# Patient Record
Sex: Male | Born: 1935 | Race: White | Hispanic: No | Marital: Married | Smoking: Former smoker
Health system: Southern US, Community
[De-identification: ages and names within clinical notes are randomized; demographics above are authoritative.]

## PROBLEM LIST (undated history)

## (undated) ENCOUNTER — Emergency Department (HOSPITAL_COMMUNITY): Admission: EM | Payer: Medicare Other | Source: Home / Self Care

## (undated) DIAGNOSIS — E785 Hyperlipidemia, unspecified: Secondary | ICD-10-CM

## (undated) DIAGNOSIS — I2510 Atherosclerotic heart disease of native coronary artery without angina pectoris: Secondary | ICD-10-CM

## (undated) DIAGNOSIS — N400 Enlarged prostate without lower urinary tract symptoms: Secondary | ICD-10-CM

## (undated) DIAGNOSIS — I10 Essential (primary) hypertension: Secondary | ICD-10-CM

## (undated) DIAGNOSIS — R519 Headache, unspecified: Secondary | ICD-10-CM

## (undated) DIAGNOSIS — R51 Headache: Secondary | ICD-10-CM

## (undated) HISTORY — DX: Hyperlipidemia, unspecified: E78.5

## (undated) HISTORY — PX: EXCISIONAL HEMORRHOIDECTOMY: SHX1541

## (undated) HISTORY — DX: Headache: R51

## (undated) HISTORY — PX: LUNG REMOVAL, PARTIAL: SHX233

## (undated) HISTORY — DX: Atherosclerotic heart disease of native coronary artery without angina pectoris: I25.10

## (undated) HISTORY — DX: Essential (primary) hypertension: I10

## (undated) HISTORY — DX: Headache, unspecified: R51.9

## (undated) HISTORY — DX: Benign prostatic hyperplasia without lower urinary tract symptoms: N40.0

---

## 1999-01-08 ENCOUNTER — Ambulatory Visit (HOSPITAL_COMMUNITY): Admission: RE | Admit: 1999-01-08 | Discharge: 1999-01-08 | Payer: Self-pay | Admitting: Internal Medicine

## 1999-01-10 ENCOUNTER — Encounter: Payer: Self-pay | Admitting: Internal Medicine

## 1999-01-10 ENCOUNTER — Ambulatory Visit (HOSPITAL_COMMUNITY): Admission: RE | Admit: 1999-01-10 | Discharge: 1999-01-10 | Payer: Self-pay | Admitting: Internal Medicine

## 1999-02-10 ENCOUNTER — Encounter: Payer: Self-pay | Admitting: Cardiothoracic Surgery

## 1999-02-11 ENCOUNTER — Inpatient Hospital Stay (HOSPITAL_COMMUNITY): Admission: RE | Admit: 1999-02-11 | Discharge: 1999-02-17 | Payer: Self-pay | Admitting: Cardiothoracic Surgery

## 1999-02-11 ENCOUNTER — Encounter: Payer: Self-pay | Admitting: Cardiothoracic Surgery

## 1999-02-12 ENCOUNTER — Encounter: Payer: Self-pay | Admitting: Cardiothoracic Surgery

## 1999-02-13 ENCOUNTER — Encounter: Payer: Self-pay | Admitting: Cardiothoracic Surgery

## 1999-02-14 ENCOUNTER — Encounter: Payer: Self-pay | Admitting: Cardiothoracic Surgery

## 1999-02-15 ENCOUNTER — Encounter: Payer: Self-pay | Admitting: Thoracic Surgery (Cardiothoracic Vascular Surgery)

## 1999-02-17 ENCOUNTER — Encounter: Payer: Self-pay | Admitting: Thoracic Surgery (Cardiothoracic Vascular Surgery)

## 2000-02-05 ENCOUNTER — Ambulatory Visit (HOSPITAL_COMMUNITY): Admission: RE | Admit: 2000-02-05 | Discharge: 2000-02-05 | Payer: Self-pay | Admitting: Internal Medicine

## 2000-02-05 ENCOUNTER — Encounter: Payer: Self-pay | Admitting: Internal Medicine

## 2001-01-29 ENCOUNTER — Emergency Department (HOSPITAL_COMMUNITY): Admission: EM | Admit: 2001-01-29 | Discharge: 2001-01-29 | Payer: Self-pay | Admitting: Emergency Medicine

## 2001-03-04 ENCOUNTER — Other Ambulatory Visit: Admission: RE | Admit: 2001-03-04 | Discharge: 2001-03-04 | Payer: Self-pay | Admitting: General Surgery

## 2003-05-08 ENCOUNTER — Ambulatory Visit (HOSPITAL_BASED_OUTPATIENT_CLINIC_OR_DEPARTMENT_OTHER): Admission: RE | Admit: 2003-05-08 | Discharge: 2003-05-08 | Payer: Self-pay | Admitting: Otolaryngology

## 2003-05-08 ENCOUNTER — Encounter (INDEPENDENT_AMBULATORY_CARE_PROVIDER_SITE_OTHER): Payer: Self-pay | Admitting: *Deleted

## 2004-07-07 ENCOUNTER — Ambulatory Visit: Payer: Self-pay | Admitting: Internal Medicine

## 2004-09-19 ENCOUNTER — Ambulatory Visit: Payer: Self-pay | Admitting: Internal Medicine

## 2004-09-25 ENCOUNTER — Ambulatory Visit: Payer: Self-pay | Admitting: Internal Medicine

## 2004-12-24 ENCOUNTER — Ambulatory Visit: Payer: Self-pay | Admitting: Internal Medicine

## 2004-12-26 ENCOUNTER — Ambulatory Visit: Payer: Self-pay | Admitting: Internal Medicine

## 2005-04-15 ENCOUNTER — Ambulatory Visit: Payer: Self-pay | Admitting: Internal Medicine

## 2005-04-22 ENCOUNTER — Ambulatory Visit: Payer: Self-pay | Admitting: Internal Medicine

## 2005-07-21 ENCOUNTER — Ambulatory Visit: Payer: Self-pay | Admitting: Internal Medicine

## 2005-07-24 ENCOUNTER — Ambulatory Visit: Payer: Self-pay | Admitting: Internal Medicine

## 2005-08-14 ENCOUNTER — Ambulatory Visit: Payer: Self-pay | Admitting: Internal Medicine

## 2005-09-24 ENCOUNTER — Ambulatory Visit: Payer: Self-pay | Admitting: Internal Medicine

## 2005-09-30 ENCOUNTER — Ambulatory Visit: Payer: Self-pay | Admitting: Internal Medicine

## 2005-10-28 ENCOUNTER — Ambulatory Visit: Payer: Self-pay | Admitting: Internal Medicine

## 2006-04-22 ENCOUNTER — Ambulatory Visit: Payer: Self-pay | Admitting: Internal Medicine

## 2006-04-30 ENCOUNTER — Ambulatory Visit: Payer: Self-pay | Admitting: Internal Medicine

## 2006-06-08 ENCOUNTER — Ambulatory Visit: Payer: Self-pay | Admitting: Internal Medicine

## 2007-03-14 ENCOUNTER — Ambulatory Visit: Payer: Self-pay | Admitting: Internal Medicine

## 2007-03-14 LAB — CONVERTED CEMR LAB
ALT: 19 units/L (ref 0–53)
AST: 17 units/L (ref 0–37)
BUN: 11 mg/dL (ref 6–23)
Bilirubin Urine: NEGATIVE
Creatinine, Ser: 1 mg/dL (ref 0.4–1.5)
Direct LDL: 126.9 mg/dL
Eosinophils Relative: 2.2 % (ref 0.0–5.0)
Glucose, Bld: 93 mg/dL (ref 70–99)
HDL: 30.6 mg/dL — ABNORMAL LOW (ref >39.0)
Hemoglobin, Urine: NEGATIVE
Leukocytes, UA: NEGATIVE
Monocytes Absolute: 0.4 10*3/uL (ref 0.2–0.7)
Neutrophils Relative %: 56.1 % (ref 43.0–77.0)
Platelets: 160 10*3/uL (ref 150–400)
Potassium: 3.9 meq/L (ref 3.5–5.1)
RBC: 4.77 M/uL (ref 4.22–5.81)
RDW: 12.9 % (ref 11.5–14.6)
Total Bilirubin: 1.3 mg/dL — ABNORMAL HIGH (ref 0.3–1.2)
Total Protein, Urine: NEGATIVE mg/dL
Triglycerides: 188 mg/dL — ABNORMAL HIGH (ref 0–149)
VLDL: 38 mg/dL (ref 0–40)
WBC: 5.3 10*3/uL (ref 4.5–10.5)
pH: 6 (ref 5.0–8.0)

## 2007-03-18 DIAGNOSIS — E785 Hyperlipidemia, unspecified: Secondary | ICD-10-CM

## 2007-03-18 DIAGNOSIS — N400 Enlarged prostate without lower urinary tract symptoms: Secondary | ICD-10-CM | POA: Insufficient documentation

## 2007-03-24 ENCOUNTER — Ambulatory Visit: Payer: Self-pay | Admitting: Internal Medicine

## 2007-04-08 ENCOUNTER — Encounter (INDEPENDENT_AMBULATORY_CARE_PROVIDER_SITE_OTHER): Payer: Self-pay | Admitting: General Surgery

## 2007-04-08 ENCOUNTER — Ambulatory Visit (HOSPITAL_COMMUNITY): Admission: RE | Admit: 2007-04-08 | Discharge: 2007-04-08 | Payer: Self-pay | Admitting: General Surgery

## 2007-12-23 ENCOUNTER — Telehealth: Payer: Self-pay | Admitting: Internal Medicine

## 2007-12-23 DIAGNOSIS — Z860100 Personal history of colon polyps, unspecified: Secondary | ICD-10-CM | POA: Insufficient documentation

## 2008-01-11 ENCOUNTER — Ambulatory Visit: Payer: Self-pay | Admitting: Gastroenterology

## 2008-01-25 ENCOUNTER — Ambulatory Visit: Payer: Self-pay | Admitting: Gastroenterology

## 2008-01-25 LAB — HM COLONOSCOPY

## 2008-03-19 ENCOUNTER — Ambulatory Visit: Payer: Self-pay | Admitting: Internal Medicine

## 2008-03-19 LAB — CONVERTED CEMR LAB
AST: 18 units/L (ref 0–37)
BUN: 10 mg/dL (ref 6–23)
Basophils Absolute: 0 10*3/uL (ref 0.0–0.1)
Basophils Relative: 0.9 % (ref 0.0–3.0)
Bilirubin, Direct: 0.1 mg/dL (ref 0.0–0.3)
Calcium: 9.2 mg/dL (ref 8.4–10.5)
Cholesterol: 208 mg/dL (ref 0–200)
Creatinine, Ser: 1.1 mg/dL (ref 0.4–1.5)
GFR calc Af Amer: 85 mL/min
GFR calc non Af Amer: 70 mL/min
Glucose, Bld: 89 mg/dL (ref 70–99)
HDL: 29.5 mg/dL — ABNORMAL LOW (ref >39.0)
Hemoglobin, Urine: NEGATIVE
Ketones, ur: NEGATIVE mg/dL
MCHC: 34.6 g/dL (ref 30.0–36.0)
MCV: 93.1 fL (ref 78.0–100.0)
Monocytes Relative: 8.2 % (ref 3.0–12.0)
Neutro Abs: 2.7 10*3/uL (ref 1.4–7.7)
Neutrophils Relative %: 53.8 % (ref 43.0–77.0)
PSA: 3.57 ng/mL (ref 0.10–4.00)
Platelets: 156 10*3/uL (ref 150–400)
RDW: 12.5 % (ref 11.5–14.6)
Specific Gravity, Urine: 1.02 (ref 1.000–1.03)
TSH: 3.24 microintl units/mL (ref 0.35–5.50)
Total CHOL/HDL Ratio: 7.1
Total Protein, Urine: NEGATIVE mg/dL
Triglycerides: 135 mg/dL (ref 0–149)
WBC: 4.9 10*3/uL (ref 4.5–10.5)

## 2008-03-26 ENCOUNTER — Ambulatory Visit: Payer: Self-pay | Admitting: Internal Medicine

## 2008-03-26 DIAGNOSIS — I10 Essential (primary) hypertension: Secondary | ICD-10-CM | POA: Insufficient documentation

## 2008-03-26 DIAGNOSIS — J069 Acute upper respiratory infection, unspecified: Secondary | ICD-10-CM | POA: Insufficient documentation

## 2009-01-28 ENCOUNTER — Telehealth: Payer: Self-pay | Admitting: Internal Medicine

## 2009-01-30 ENCOUNTER — Ambulatory Visit: Payer: Self-pay | Admitting: Internal Medicine

## 2009-01-30 DIAGNOSIS — R519 Headache, unspecified: Secondary | ICD-10-CM | POA: Insufficient documentation

## 2009-01-30 DIAGNOSIS — J310 Chronic rhinitis: Secondary | ICD-10-CM | POA: Insufficient documentation

## 2009-01-30 DIAGNOSIS — J329 Chronic sinusitis, unspecified: Secondary | ICD-10-CM | POA: Insufficient documentation

## 2009-01-30 DIAGNOSIS — R51 Headache: Secondary | ICD-10-CM

## 2009-01-30 LAB — CONVERTED CEMR LAB
Calcium: 9.3 mg/dL (ref 8.4–10.5)
Creatinine, Ser: 1.2 mg/dL (ref 0.4–1.5)

## 2009-01-31 ENCOUNTER — Telehealth: Payer: Self-pay | Admitting: Internal Medicine

## 2009-02-25 ENCOUNTER — Telehealth: Payer: Self-pay | Admitting: Internal Medicine

## 2009-02-27 ENCOUNTER — Encounter: Payer: Self-pay | Admitting: Internal Medicine

## 2009-03-11 ENCOUNTER — Telehealth (INDEPENDENT_AMBULATORY_CARE_PROVIDER_SITE_OTHER): Payer: Self-pay | Admitting: *Deleted

## 2009-03-12 ENCOUNTER — Encounter (INDEPENDENT_AMBULATORY_CARE_PROVIDER_SITE_OTHER): Payer: Self-pay | Admitting: *Deleted

## 2009-04-23 ENCOUNTER — Encounter: Payer: Self-pay | Admitting: Internal Medicine

## 2009-05-10 ENCOUNTER — Encounter: Payer: Self-pay | Admitting: Internal Medicine

## 2009-06-07 ENCOUNTER — Ambulatory Visit: Payer: Self-pay | Admitting: Internal Medicine

## 2009-06-07 DIAGNOSIS — Z87891 Personal history of nicotine dependence: Secondary | ICD-10-CM | POA: Insufficient documentation

## 2009-06-10 LAB — CONVERTED CEMR LAB
ALT: 22 units/L (ref 0–53)
AST: 20 units/L (ref 0–37)
Albumin: 3.9 g/dL (ref 3.5–5.2)
Bilirubin Urine: NEGATIVE
CO2: 30 meq/L (ref 19–32)
Calcium: 9.2 mg/dL (ref 8.4–10.5)
Chloride: 107 meq/L (ref 96–112)
Creatinine, Ser: 1.1 mg/dL (ref 0.4–1.5)
Eosinophils Relative: 2.4 % (ref 0.0–5.0)
Hemoglobin, Urine: NEGATIVE
Hemoglobin: 15.1 g/dL (ref 13.0–17.0)
Leukocytes, UA: NEGATIVE
Lymphocytes Relative: 26.5 % (ref 12.0–46.0)
Lymphs Abs: 1.2 10*3/uL (ref 0.7–4.0)
MCV: 93.3 fL (ref 78.0–100.0)
Monocytes Absolute: 0.3 10*3/uL (ref 0.1–1.0)
Monocytes Relative: 6.4 % (ref 3.0–12.0)
Neutro Abs: 3.1 10*3/uL (ref 1.4–7.7)
Neutrophils Relative %: 63.7 % (ref 43.0–77.0)
Nitrite: NEGATIVE
Platelets: 164 10*3/uL (ref 150.0–400.0)
Potassium: 4.1 meq/L (ref 3.5–5.1)
RDW: 12.6 % (ref 11.5–14.6)
Specific Gravity, Urine: 1.015 (ref 1.000–1.030)
Total Bilirubin: 0.9 mg/dL (ref 0.3–1.2)
Total Protein, Urine: NEGATIVE mg/dL
Total Protein: 7.3 g/dL (ref 6.0–8.3)

## 2009-06-14 ENCOUNTER — Encounter: Payer: Self-pay | Admitting: Internal Medicine

## 2009-06-14 DIAGNOSIS — R9431 Abnormal electrocardiogram [ECG] [EKG]: Secondary | ICD-10-CM | POA: Insufficient documentation

## 2009-06-17 ENCOUNTER — Encounter: Payer: Self-pay | Admitting: Internal Medicine

## 2009-06-19 ENCOUNTER — Encounter (INDEPENDENT_AMBULATORY_CARE_PROVIDER_SITE_OTHER): Payer: Self-pay | Admitting: *Deleted

## 2009-07-02 ENCOUNTER — Ambulatory Visit: Payer: Self-pay | Admitting: Cardiovascular Disease

## 2009-07-22 ENCOUNTER — Ambulatory Visit: Payer: Self-pay | Admitting: Cardiology

## 2009-07-22 ENCOUNTER — Ambulatory Visit (HOSPITAL_COMMUNITY): Admission: RE | Admit: 2009-07-22 | Discharge: 2009-07-22 | Payer: Self-pay | Admitting: Cardiovascular Disease

## 2009-07-22 ENCOUNTER — Ambulatory Visit: Payer: Self-pay

## 2009-07-22 ENCOUNTER — Encounter: Payer: Self-pay | Admitting: Cardiovascular Disease

## 2009-08-07 ENCOUNTER — Telehealth: Payer: Self-pay | Admitting: Internal Medicine

## 2009-12-17 ENCOUNTER — Encounter: Payer: Self-pay | Admitting: Internal Medicine

## 2010-03-10 ENCOUNTER — Encounter: Payer: Self-pay | Admitting: Internal Medicine

## 2010-03-10 ENCOUNTER — Encounter: Admission: RE | Admit: 2010-03-10 | Discharge: 2010-03-10 | Payer: Self-pay | Admitting: Neurology

## 2010-05-27 ENCOUNTER — Encounter: Payer: Self-pay | Admitting: Internal Medicine

## 2010-08-19 ENCOUNTER — Encounter: Payer: Self-pay | Admitting: Internal Medicine

## 2010-08-19 ENCOUNTER — Ambulatory Visit
Admission: RE | Admit: 2010-08-19 | Discharge: 2010-08-19 | Payer: Self-pay | Source: Home / Self Care | Attending: Internal Medicine | Admitting: Internal Medicine

## 2010-08-19 DIAGNOSIS — D485 Neoplasm of uncertain behavior of skin: Secondary | ICD-10-CM | POA: Insufficient documentation

## 2010-08-19 DIAGNOSIS — R0989 Other specified symptoms and signs involving the circulatory and respiratory systems: Secondary | ICD-10-CM | POA: Insufficient documentation

## 2010-08-22 LAB — CONVERTED CEMR LAB
Alkaline Phosphatase: 94 units/L (ref 39–117)
Bilirubin Urine: NEGATIVE
Bilirubin, Direct: 0.1 mg/dL (ref 0.0–0.3)
Blood, UA: NEGATIVE
Calcium: 9.2 mg/dL (ref 8.4–10.5)
Creatinine, Ser: 1 mg/dL (ref 0.4–1.5)
Eosinophils Absolute: 0 10*3/uL (ref 0.0–0.7)
Eosinophils Relative: 0.7 % (ref 0.0–5.0)
HCT: 41.7 % (ref 39.0–52.0)
HDL: 33.1 mg/dL — ABNORMAL LOW (ref >39.00)
Hemoglobin: 14.4 g/dL (ref 13.0–17.0)
Leukocytes, UA: NEGATIVE
Lymphocytes Relative: 19.7 % (ref 12.0–46.0)
Monocytes Absolute: 0.3 10*3/uL (ref 0.1–1.0)
Neutro Abs: 3.8 10*3/uL (ref 1.4–7.7)
Nitrite: NEGATIVE
PSA: 5.41 ng/mL — ABNORMAL HIGH (ref 0.10–4.00)
RDW: 13.8 % (ref 11.5–14.6)
TSH: 2.12 microintl units/mL (ref 0.35–5.50)
Total Bilirubin: 0.9 mg/dL (ref 0.3–1.2)
Total CHOL/HDL Ratio: 7
Total Protein, Urine: NEGATIVE mg/dL
Triglycerides: 179 mg/dL — ABNORMAL HIGH (ref 0.0–149.0)
Urobilinogen, UA: 0.2 (ref 0.0–1.0)
VLDL: 35.8 mg/dL (ref 0.0–40.0)
WBC: 5.2 10*3/uL (ref 4.5–10.5)

## 2010-09-08 ENCOUNTER — Encounter: Payer: Self-pay | Admitting: Internal Medicine

## 2010-09-16 NOTE — Letter (Signed)
Summary: [facility]  [facility]   Imported By: Lester  01/20/2010 09:44:37  _____________________________________________________________________  External Attachment:    Type:   Image     Comment:   External Document

## 2010-09-16 NOTE — Letter (Signed)
Summary: [facility]  [facility]   Imported By: Sherian Rein 03/20/2010 07:55:45  _____________________________________________________________________  External Attachment:    Type:   Image     Comment:   External Document

## 2010-09-16 NOTE — Miscellaneous (Signed)
Summary: Flu 2011   Clinical Lists Changes  Observations: Added new observation of FLU VAX: Historical (05/24/2010 11:55)      Immunization History:  Influenza Immunization History:    Influenza:  historical (05/24/2010)

## 2010-09-18 NOTE — Assessment & Plan Note (Signed)
Summary: YEARLY FU/ MEDICARE/ WILL COME FASTING/NWS  #   Vital Signs:  Patient profile:   77 year old male Height:      70 inches Weight:      202 pounds BMI:     29.09 Temp:     97.5 degrees F oral Pulse rate:   96 / minute Pulse rhythm:   regular Resp:     16 per minute BP sitting:   160 / 100  (left arm) Cuff size:   regular  Vitals Entered By: Lanier Prude, CMA(AAMA) (August 19, 2010 8:30 AM) CC: MWV Is Patient Diabetic? No   CC:  MWV.  History of Present Illness: The patient presents for a preventive health examination   Current Medications (verified): 1)  Terazosin Hcl 10 Mg  Caps (Terazosin Hcl) .... Once Daily 2)  Gensing .... Once Daily 3)  Vitamin C 500 Mg Tabs (Ascorbic Acid) .Marland Kitchen.. 1 Tab By Mouth Once Daily 4)  Glucosamine-Chondroitin   Caps (Glucosamine-Chondroit-Vit C-Mn) .Marland Kitchen.. 1 Tab By Mouth Once Daily 5)  Multivitamins   Tabs (Multiple Vitamin) .... Tab By Mouth Once Daily 6)  Omega-3 350 Mg Caps (Omega-3 Fatty Acids) .Marland Kitchen.. 1 Tab By Mouth Once Daily 7)  Saw Palmetto   Powd (Saw [place]) .... Daily 8)  Vitamin D3 1000 Unit  Tabs (Cholecalciferol) .Marland Kitchen.. 1 By Mouth Daily 9)  Fluticasone Propionate 50 Mcg/act  Susp (Fluticasone Propionate) .... 2 Sprays Each Nostril Once Daily 10)  Niacin Cr 500 Mg Cr-Caps (Niacin) .Marland Kitchen.. 1 Tab By Mouth Once Daily 11)  Zostavax 96045 Unt/0.63ml Solr (Zoster Vaccine Live) .... Administer As Directed By Pharmacy or At Dr's Office  Allergies (verified): 1)  * Lisinopril  Past History:  Past Medical History: Last updated: 06/07/2009 Benign prostatic hypertrophy Dr Isabel Caprice Hyperlipidemia Hypertension Traction HAs Dr Anne Hahn - had an MRI/MRA  Past Surgical History: Last updated: Jul 05, 2009  Excision of external hemorrhoids.   Family History: Last updated: 2009-07-05  Mother died with complications of rheumatic fever in   her 30s. Father died of MI at age 41 or 49. There is no cancer or   diabetes in the family.       Social History: Last updated: 07-05-09  He retired about a year ago. He does not smoke or drink   alcohol. He is married.      Review of Systems  The patient denies anorexia, fever, weight loss, weight gain, vision loss, decreased hearing, hoarseness, chest pain, syncope, dyspnea on exertion, peripheral edema, prolonged cough, headaches, hemoptysis, abdominal pain, melena, hematochezia, severe indigestion/heartburn, hematuria, incontinence, genital sores, muscle weakness, suspicious skin lesions, transient blindness, difficulty walking, depression, unusual weight change, abnormal bleeding, enlarged lymph nodes, angioedema, and testicular masses.    Physical Exam  General:  Well-developed,well-nourished,in no acute distress; alert,appropriate and cooperative throughout examination Head:  Normocephalic and atraumatic without obvious abnormalities. No apparent alopecia or balding. Eyes:  No corneal or conjunctival inflammation noted. EOMI. Perrla.  Ears:  Eryth throat Nose:  Swollen B nasal passages Mouth:  Oral mucosa and oropharynx without lesions or exudates.  Teeth in good repair. Neck:  No deformities, masses, or tenderness noted. Chest Wall:  No deformities, masses, tenderness or gynecomastia noted. Lungs:  Normal respiratory effort, chest expands symmetrically. Lungs are clear to auscultation, no crackles or wheezes. Heart:  Normal rate and regular rhythm. S1 and S2 normal without gallop, murmur, click, rub or other extra sounds. Abdomen:  Bowel sounds positive,abdomen soft and non-tender without masses, organomegaly or  hernias noted. Msk:  No deformity or scoliosis noted of thoracic or lumbar spine.   Pulses:  R and L carotid,radial,femoral,dorsalis pedis and posterior tibial pulses are full and equal bilaterally Extremities:  No clubbing, cyanosis, edema, or deformity noted with normal full range of motion of all joints.   Neurologic:  No cranial nerve deficits noted. Station  and gait are normal. Plantar reflexes are down-going bilaterally. DTRs are symmetrical throughout. Sensory, motor and coordinative functions appear intact. Skin:  L shoulder lesion 11 mm Cervical Nodes:  No lymphadenopathy noted Inguinal Nodes:  No significant adenopathy Psych:  Cognition and judgment appear intact. Alert and cooperative with normal attention span and concentration. No apparent delusions, illusions, hallucinations   Impression & Recommendations:  Problem # 1:  WELL ADULT EXAM (ICD-V70.0) Assessment New The labs were ordered Zostavax Orders: EKG w/ Interpretation (93000) TLB-BMP (Basic Metabolic Panel-BMET) (80048-METABOL) TLB-CBC Platelet - w/Differential (85025-CBCD) TLB-Hepatic/Liver Function Pnl (80076-HEPATIC) TLB-Lipid Panel (80061-LIPID) TLB-PSA (Prostate Specific Antigen) (84153-PSA) TLB-TSH (Thyroid Stimulating Hormone) (84443-TSH) TLB-Udip ONLY (81003-UDIP) no change from before Health and age related issues were discussed. Available screening tests and vaccinations were discussed as well. Healthy life style including good diet and exercise was discussed.   Problem # 2:  HYPERTENSION (ICD-401.9) Assessment: Deteriorated  His updated medication list for this problem includes:    Losartan Potassium 100 Mg Tabs (Losartan potassium) .Marland Kitchen... 1 by mouth once daily for blood pressure    Terazosin Hcl 10 Mg Caps (Terazosin hcl) ..... Once daily  BP today: 160/100 Prior BP: 150/80 (07/02/2009)  Labs Reviewed: K+: 4.1 (06/07/2009) Creat: : 1.1 (06/07/2009)   Chol: 208 (03/19/2008)   HDL: 29.5 (03/19/2008)   LDL: DEL (03/19/2008)   TG: 135 (03/19/2008)  Problem # 3:  DYSLIPIDEMIA (ICD-272.4) Assessment: Unchanged  His updated medication list for this problem includes:    Niacin Cr 500 Mg Cr-caps (Niacin) .Marland Kitchen... 1 tab by mouth once daily  Problem # 4:  BENIGN PROSTATIC HYPERTROPHY (ICD-600.00) Assessment: Unchanged  His updated medication list for this  problem includes:    Terazosin Hcl 10 Mg Caps (Terazosin hcl) ..... 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 1 by mouth once daily for blood pressure 2)  Terazosin Hcl 10 Mg Caps (Terazosin hcl) .... Once daily 3)  Fluticasone Propionate 50 Mcg/act Susp (Fluticasone propionate) .... 2 sprays each nostril once daily 4)  Niacin Cr 500 Mg Cr-caps (Niacin) .Marland Kitchen.. 1 tab by mouth once daily 5)  Zostavax 04540 Unt/0.64ml Solr (Zoster vaccine live) .... Administer as directed by pharmacy or at dr's office 6)  Gensing  .... Once daily 7)  Vitamin C 500 Mg Tabs (Ascorbic acid) .Marland Kitchen.. 1 tab by mouth once daily 8)  Glucosamine-chondroitin Caps (Glucosamine-chondroit-vit c-mn) .Marland Kitchen.. 1 tab by mouth once daily 9)  Multivitamins Tabs (Multiple vitamin) .... Tab by mouth once daily 10)  Omega-3 350 Mg Caps (Omega-3 fatty acids) .Marland Kitchen.. 1 tab by mouth once daily 11)  Saw Palmetto Powd (Saw palmetto) .... Daily 12)  Vitamin D3 1000 Unit Tabs (Cholecalciferol) .Marland Kitchen.. 1 by mouth daily  Other Orders: Zoster (Shingles) Vaccine Live [phone]) Admin 1st Vaccine (14782) T-2 View CXR, Same Day (71020.5TC)  Patient Instructions: 1)  Contour pillow 2)  Please schedule a follow-up appointment in 1-2 months skin bx  . Prescriptions: FLUTICASONE PROPIONATE 50 MCG/ACT  SUSP (FLUTICASONE PROPIONATE) 2 sprays each nostril once daily  #1 x 11   Entered and Authorized by:   Tresa Garter MD   Signed by:   Tresa Garter MD on 08/19/2010   Method used:   Print then Give to Patient   RxID:   [id number] TERAZOSIN HCL 10 MG  CAPS (TERAZOSIN HCL) once daily  #30 x 11   Entered and Authorized by:   Tresa Garter MD   Signed by:    Tresa Garter MD on 08/19/2010   Method used:   Print then Give to Patient   RxID:   [id number] QIONGEXB POTASSIUM 100 MG TABS (LOSARTAN POTASSIUM) 1 by mouth once daily for blood pressure  #30 x 11   Entered and Authorized by:   Tresa Garter MD   Signed by:   Tresa Garter MD on 08/19/2010   Method used:   Print then Give to Patient   RxID:   [id number]    Orders Added: 1)  EKG w/ Interpretation [93000] 2)  Est. Patient age 58&> [66440] 3)  Zoster (Shingles) Vaccine Live [90736] 4)  Admin 1st Vaccine [90471] 5)  T-2 View CXR, Same Day [71020.5TC] 6)  TLB-BMP (Basic Metabolic Panel-BMET) [80048-METABOL] 7)  TLB-CBC Platelet - w/Differential [85025-CBCD] 8)  TLB-Hepatic/Liver Function Pnl [80076-HEPATIC] 9)  TLB-Lipid Panel [80061-LIPID] 10)  TLB-PSA (Prostate Specific Antigen) [84153-PSA] 11)  TLB-TSH (Thyroid Stimulating Hormone) [84443-TSH] 12)  TLB-Udip ONLY [81003-UDIP]   Immunizations Administered:  Zostavax # 1:    Vaccine Type: Zostavax    Site: left deltoid    Mfr: Merck    Dose: 0.5 ml    Route: [place]    Given by: Lanier Prude, CMA(AAMA)    Exp. Date: 04/04/2011    Lot #: [id number]    VIS given: 05/29/05 given August 19, 2010.   Immunizations Administered:  Zostavax # 1:    Vaccine Type: Zostavax    Site: left deltoid    Mfr: Merck    Dose: 0.5 ml    Route: [place]    Given by: Lanier Prude, CMA(AAMA)    Exp. Date: 04/04/2011    Lot #: [id number]    VIS given: 05/29/05 given August 19, 2010.   Appended Document: YEARLY FU/ MEDICARE/ WILL COME FASTING/NWS  # pt informed

## 2010-09-29 ENCOUNTER — Encounter (INDEPENDENT_AMBULATORY_CARE_PROVIDER_SITE_OTHER): Payer: Self-pay | Admitting: *Deleted

## 2010-09-29 ENCOUNTER — Encounter: Payer: Self-pay | Admitting: Internal Medicine

## 2010-09-29 ENCOUNTER — Other Ambulatory Visit: Payer: Self-pay | Admitting: Internal Medicine

## 2010-09-29 ENCOUNTER — Other Ambulatory Visit: Payer: Medicare Other

## 2010-09-29 DIAGNOSIS — E7800 Pure hypercholesterolemia, unspecified: Secondary | ICD-10-CM

## 2010-09-29 DIAGNOSIS — E785 Hyperlipidemia, unspecified: Secondary | ICD-10-CM

## 2010-09-29 DIAGNOSIS — N320 Bladder-neck obstruction: Secondary | ICD-10-CM

## 2010-09-29 LAB — LDL CHOLESTEROL, DIRECT: Direct LDL: 161 mg/dL

## 2010-09-29 LAB — PSA: PSA: 3.93 ng/mL (ref 0.10–4.00)

## 2010-09-29 LAB — LIPID PANEL: Total CHOL/HDL Ratio: 6

## 2010-09-29 LAB — CONVERTED CEMR LAB: PSA, Free: 0.8 ng/mL

## 2010-10-06 ENCOUNTER — Encounter: Payer: Self-pay | Admitting: Internal Medicine

## 2010-10-06 ENCOUNTER — Ambulatory Visit (INDEPENDENT_AMBULATORY_CARE_PROVIDER_SITE_OTHER): Payer: Medicare Other | Admitting: Internal Medicine

## 2010-10-06 DIAGNOSIS — I10 Essential (primary) hypertension: Secondary | ICD-10-CM

## 2010-10-06 DIAGNOSIS — N400 Enlarged prostate without lower urinary tract symptoms: Secondary | ICD-10-CM

## 2010-10-06 DIAGNOSIS — D485 Neoplasm of uncertain behavior of skin: Secondary | ICD-10-CM

## 2010-10-06 DIAGNOSIS — R972 Elevated prostate specific antigen [PSA]: Secondary | ICD-10-CM | POA: Insufficient documentation

## 2010-10-14 NOTE — Assessment & Plan Note (Signed)
Summary: SKIN BX/#/CD   Vital Signs:  Patient profile:   77 year old male Height:      70 inches Weight:      204 pounds BMI:     29.38 Temp:     97.9 degrees F oral Pulse rate:   80 / minute Pulse rhythm:   regular Resp:     16 per minute BP sitting:   120 / 78  (left arm) Cuff size:   regular  Vitals Entered By: Lanier Prude, CMA(AAMA) (October 06, 2010 2:13 PM) CC: f/u  Is Patient Diabetic? No   CC:  f/u .  History of Present Illness: F/u skin lesion, elev PSA, chol and HTN. C/o lump on L post leg - almost gone now...  Current Medications (verified): 1)  Losartan Potassium 100 Mg Tabs (Losartan Potassium) .Marland Kitchen.. 1 By Mouth Once Daily For Blood Pressure 2)  Terazosin Hcl 10 Mg  Caps (Terazosin Hcl) .... Once Daily 3)  Fluticasone Propionate 50 Mcg/act  Susp (Fluticasone Propionate) .... 2 Sprays Each Nostril Once Daily 4)  Niacin Cr 500 Mg Cr-Caps (Niacin) .Marland Kitchen.. 1 Tab By Mouth Once Daily 5)  Zostavax 09381 Unt/0.41ml Solr (Zoster Vaccine Live) .... Administer As Directed By Pharmacy or At [facility] 6)  [place] .... Once Daily 7)  Vitamin C 500 Mg Tabs (Ascorbic Acid) .Marland Kitchen.. 1 Tab By Mouth Once Daily 8)  Glucosamine-Chondroitin   Caps (Glucosamine-Chondroit-Vit C-Mn) .Marland Kitchen.. 1 Tab By Mouth Once Daily 9)  Multivitamins   Tabs (Multiple Vitamin) .... Tab By Mouth Once Daily 10)  Omega-3 350 Mg Caps (Omega-3 Fatty Acids) .Marland Kitchen.. 1 Tab By Mouth Once Daily 11)  Saw Palmetto   Powd (Saw [place]) .... Daily 12)  Vitamin D3 1000 Unit  Tabs (Cholecalciferol) .Marland Kitchen.. 1 By Mouth Daily  Allergies (verified): 1)  * Lisinopril  Past History:  Past Medical History: Last updated: 06/07/2009 Benign prostatic hypertrophy Dr Isabel Caprice Hyperlipidemia Hypertension Traction HAs Dr Anne Hahn - had an MRI/MRA  Past Surgical History: Last updated: 06/28/2009  Excision of external hemorrhoids.   Social History: Last updated: 06/28/2009  He retired about a year ago. He does not smoke or drink   alcohol. He is married.      Review of Systems  The patient denies fever, weight loss, weight gain, syncope, and abdominal pain.    Physical Exam  General:  Well-developed,well-nourished,in no acute distress; alert,appropriate and cooperative throughout examination Nose:  Swollen B nasal passages Mouth:  Oral mucosa and oropharynx without lesions or exudates.  Teeth in good repair. Neck:  No deformities, masses, or tenderness noted. Lungs:  Normal respiratory effort, chest expands symmetrically. Lungs are clear to auscultation, no crackles or wheezes. Heart:  Normal rate and regular rhythm. S1 and S2 normal without gallop, murmur, click, rub or other extra sounds. Abdomen:  Bowel sounds positive,abdomen soft and non-tender without masses, organomegaly or hernias noted. Msk:  No deformity or scoliosis noted of thoracic or lumbar spine.   Extremities:  No clubbing, cyanosis, edema, or deformity noted with normal full range of motion of all joints.   Skin:  L shoulder lesion has disappeared 3x2 mm subcut nodule mobile on L post prox thigh Psych:  Cognition and judgment appear intact. Alert and cooperative with normal attention span and concentration. No apparent delusions, illusions, hallucinations   Impression & Recommendations:  Problem # 1:  NEOPLASM OF UNCERTAIN BEHAVIOR OF SKIN (ICD-238.2) resolved Assessment Improved No need to biopsy   Problem # 2:  HYPERTENSION (ICD-401.9)  Assessment: Improved  His updated medication list for this problem includes:    Losartan Potassium 100 Mg Tabs (Losartan potassium) .Marland Kitchen... 1 by mouth once daily for blood pressure    Terazosin Hcl 10 Mg Caps (Terazosin hcl) ..... Once daily  Problem # 3:  BENIGN PROSTATIC HYPERTROPHY (ICD-600.00) Assessment: Improved  His updated medication list for this problem includes:    Terazosin Hcl 10 Mg Caps (Terazosin hcl) ..... Once daily  Problem # 4:  PSA, INCREASED (ICD-790.93) Assessment: Improved The  labs were reviewed with the patient. F/u with Dr Isabel Caprice Free PSA -- risk of cancer 20%  Problem # 5:  HYPERLIPIDEMIA (ICD-272.4)  His updated medication list for this problem includes:    Niacin Cr 500 Mg Cr-caps (Niacin) .Marland Kitchen... 1 tab by mouth once daily  Labs Reviewed: SGOT: 22 (08/19/2010)   SGPT: 23 (08/19/2010)   HDL:38.20 (09/29/2010), 33.10 (08/19/2010)  LDL:DEL (03/19/2008), DEL (03/14/2007)  Chol:227 (09/29/2010), 234 (08/19/2010)  Trig:99.0 (09/29/2010), 179.0 (08/19/2010)  Problem # 6:  NEOPLASM OF UNCERTAIN BEHAVIOR OF SKIN (ICD-238.2) L post prox thigh Assessment: New ?? resolving superficial clot  Complete Medication List: 1)  Losartan Potassium 100 Mg Tabs (Losartan potassium) .Marland Kitchen.. 1 by mouth once daily for blood pressure 2)  Terazosin Hcl 10 Mg Caps (Terazosin hcl) .... Once daily 3)  Fluticasone Propionate 50 Mcg/act Susp (Fluticasone propionate) .... 2 sprays each nostril once daily 4)  Niacin Cr 500 Mg Cr-caps (Niacin) .Marland Kitchen.. 1 tab by mouth once daily 5)  Zostavax 16109 Unt/0.31ml Solr (Zoster vaccine live) .... Administer as directed by pharmacy or at dr's office 6)  [place]  .... Once daily 7)  Vitamin C 500 Mg Tabs (Ascorbic acid) .Marland Kitchen.. 1 tab by mouth once daily 8)  Glucosamine-chondroitin Caps (Glucosamine-chondroit-vit c-mn) .Marland Kitchen.. 1 tab by mouth once daily 9)  Multivitamins Tabs (Multiple vitamin) .... Tab by mouth once daily 10)  Omega-3 350 Mg Caps (Omega-3 fatty acids) .Marland Kitchen.. 1 tab by mouth once daily 11)  Saw Palmetto Powd (Saw palmetto) .... Daily 12)  Vitamin D3 1000 Unit Tabs (Cholecalciferol) .Marland Kitchen.. 1 by mouth daily  Patient Instructions: 1)  Please schedule a follow-up appointment in 6-8 months well w/labs.   Orders Added: 1)  Est. Patient Level IV [60454]

## 2010-12-30 NOTE — Op Note (Signed)
Curtis Jacobs, Curtis Jacobs                  ACCOUNT NO.:  [id number]   MEDICAL RECORD NO.:  [id number]          PATIENT TYPE:  AMB   LOCATION:  DAY                          FACILITY:  [facility]   PHYSICIAN:  Timothy E. Earlene Plater, M.D. DATE OF BIRTH:  [birth year]   DATE OF PROCEDURE:  04/08/2007  DATE OF DISCHARGE:                               OPERATIVE REPORT   PREOPERATIVE DIAGNOSIS:  External hemorrhoids.   POSTOPERATIVE DIAGNOSIS:  External hemorrhoids.   PROCEDURE:  Excision of external hemorrhoids.   SURGEON:  Timothy E. Earlene Plater, M.D.   ANESTHESIA:  General.   Ms. Allinson has been seen and followed in the office for severe pruritus  ani treated with Analpram and local measures.  The pruritus ani is under  control but the skin tags are quite large and he wishes to have these  removed.  They do cause friction irritation and worsening of the  symptoms.  He was seen, identified, and the permit signed.   He was taken to the operating room and placed supine.  LMA anesthesia  was provided.  He was placed in lithotomy position.  The perianal area  was carefully inspected even with magnification and a number of these  tiny polyps around the anal orifice actually looked like condylomata.  The smaller bumps were on top of the large skin tags which were typical  of skin tags.  The skin tags were simply sharply excised and then the  subsequent incision was closed with running 3-0 chromic.  The smaller  bumps or cutaneous lesions were electrocauterized around about the anal  orifice, even on top of the previous incisions.  There was no disease in  the anal canal and rectum.  I know this is certainly not a perfectly  cosmetic removal of external tag, I think that it will take care of his  symptoms.  Also, the tissue will be analyzed to see if actual condyloma  are present.  The patient tolerated it well.  Counts were correct.  A  dry sterile dressing was applied.  He was removed to the recovery room.   He  was given Percocet and instructions to be followed in the office.      Timothy E. Earlene Plater, M.D.  Electronically Signed     TED/MEDQ  D:  04/08/2007  T:  04/09/2007  Job:  [id number]   cc:   [name]. Plotnikov, MD  [address]  [place]  [place] [address]

## 2010-12-30 NOTE — Assessment & Plan Note (Signed)
[facility]                           PRIMARY CARE OFFICE NOTE   Curtis Jacobs, Curtis Jacobs                    MRN:          [id number]  DATE:03/24/2007                            DOB:          06-03-1933    The patient is a 77 year old male who presents for a wellness  examination.   ALLERGIES:  None.   PAST MEDICAL HISTORY:  Elevated PSA. History of abnormal chest x-ray  with mini thoracotomy in the year 2000. Biopsy positive for granuloma.  Allergies. Erectile dysfunction.   LABORATORY DATA:  Lovastatin caused elevation of the liver test.   SOCIAL HISTORY:  He retired about a year ago. He does not smoke or drink  alcohol. He is married.   FAMILY HISTORY:  Mother died with complications of rheumatic fever in  her 30s. Father died of MI at age 87 or 22. There is no cancer or  diabetes in the family.   CURRENT MEDICATIONS:  1. Proscar 1 daily.  2. Hytrin 2 mg nightly.  3. Verapamil 180 mg daily.  4. Astelin nasal spray p.r.n.  5. Viagra 50-100 p.r.n.   REVIEW OF SYSTEMS:  No chest pain or shortness of breath. No syncope. No  neurologic problems. No blood in the stool. No indigestion. No emotional  concerns. The rest of the 18 point review of systems is negative.   PHYSICAL EXAMINATION:  VITAL SIGNS:  Blood pressure 139/74, pulse 78,  temperature 97.5, weight 198 pounds.  GENERAL:  He looks well and is in no acute distress.  HEENT:  Moist mucosa.  NECK:  Supple. No thyromegaly or bruit.  LUNGS:  Clear. No wheeze or rales.  HEART:  S1, S2. No murmur or gallop.  ABDOMEN:  Soft. Nontender, no organomegaly felt.  LOWER EXTREMITIES:  Without edema.  NEUROLOGIC:  He is alert and cooperative. He denies being depressed.  GENITALIA:  Normal.  RECTAL:  No masses. Prostate slightly enlarged. No nodules. Stool is  guaiac negative.  SKIN:  Clear with aging changes.   LABORATORY DATA:  On 03/14/2007: CBC normal. CMET normal. Cholesterol  221,  triglycerides 188, LDL 126. TSH 3.91, TSA 4.67. Urinalysis normal.  HDL 30.6.   ASSESSMENT AND PLAN:  1. Normal wellness examination.  Age/health-related issues discussed,      healthy lifestyle discussed. His EKG today is unchanged from      previously. He does have normal sinus rhythm with bifascicular      block, which is unchanged. He will continue with his active      lifestyle and good diet. Repeat exam in 12 months.  2. Elevated TSH. He has been seeing Dr. Isabel Caprice. every 6 months.  3. Dyslipidemia. We will add Lovaza 2 twice daily. He will continue      with niacin 500 daily.  4. I will see back in 6 months. Repeat exam in 6 months.  5. H/o abnormal CXR. Obtained chest x-ray today.     [name]. Plotnikov, MD  Electronically Signed    AVP/MedQ  DD: 03/26/2007  DT: 03/27/2007  Job #: [id number]   cc:   Onalee Hua  Ellin Goodie, M.D.

## 2011-01-02 NOTE — Op Note (Signed)
NAME:  Curtis Jacobs, Curtis Jacobs                            ACCOUNT NO.:  [id number]   MEDICAL RECORD NO.:  [id number]                   PATIENT TYPE:  AMB   LOCATION:  DSC                                  FACILITY:  [facility]   PHYSICIAN:  Christopher E. Ezzard Standing, M.D.         DATE OF BIRTH:  Nov 13, 1932   DATE OF PROCEDURE:  05/08/2003  DATE OF DISCHARGE:                                 OPERATIVE REPORT   PREOPERATIVE DIAGNOSIS:  1. Right cheek cyst, 1 cm.  2. Right upper lip sore, 0.5 cm.  3. Left lower lip lesion, 1 cm.   POSTOPERATIVE DIAGNOSIS:  1. Right cheek cyst, 1 cm.  2. Right upper lip sore, 0.5 cm.  3. Left lower lip lesion, 1 cm.   OPERATION PERFORMED:  1. Excision of right cheek cyst with intermediate closure, 1 cm.  2. Excision of right upper lip lesion, 0.5 cm, simple closure.  3. Excision of left lower lip lesion, 1 cm, simple closure.   SURGEON:  Kristine Garbe. Ezzard Standing, M.D.   ANESTHESIA:  Local 1% Xylocaine with 1:100,000 epinephrine.   INDICATIONS FOR PROCEDURE:  [name] is a 77 year old gentleman who has  had a nodule on the left lower lip.  It appears to represent a hemangioma  type lesion of the lower lip for several months.  He has also had a chronic  recurring sore of the right upper lip, which is very small, measures 3 to 4  mm.  He also has a lesion of the right cheek which represents a nodular cyst  and rubs against his glasses and is bothersome.  He is taken to the  operating room at this time for excision of the three lesions under local  anesthetic.   DESCRIPTION OF PROCEDURE:  The right cheek was prepped with Betadine  solution.  The nodular cyst was injected with Xylocaine with epinephrine.  This was then elliptically excised and sent to pathology.  The defect was  closed with a 5-0 Vicryl suture subcuticular and 6-0 nylon interrupted  through the skin.  Following this, the right upper lip lesion was injected  with Xylocaine with epinephrine.  This was  elliptically excised and sent to  pathology and a single 6-0 nylon was used to close this defect.  The left  lower lip lesion again was injected with Xylocaine with epinephrine and this  was elliptically excised from the mucosal edges of the lower lip.  The  labial artery was ligated with a 5-0 Vicryl suture and then the defect was  closed with interrupted 5-0 chromic sutures.  The patient tolerated the  procedure well.  Bacitracin ointment was applied to the excision sites  followed by a band-aid on the right upper lip and right cheek area.  Will  have him  follow up in my office in six days to review pathology and to have  sutures removed.  Kristine Garbe. Ezzard Standing, M.D.   CEN/MEDQ  D:  05/08/2003  T:  05/08/2003  Job:  [id number]

## 2011-01-02 NOTE — Assessment & Plan Note (Signed)
[facility]                             PRIMARY CARE OFFICE NOTE   ESTIL, VALLEE                    MRN:          [id number]  DATE:06/08/2006                            DOB:          [birth year]    PROCEDURE:  Skin biopsy.   INDICATIONS:  Mole with irregular color.   Mole #1 measuring 6 mm left lateral upper back was identified.  Risks  including bleeding, unsuccessful procedure, infection and others as well as  benefits explained to the patient in detail.  He agreed to proceed.  The  area was prepped with Betadine and alcohol.  Local with 0.5 mL of 2%  lidocaine with epinephrine.  Shave biopsy with sterile dermal blade was  performed.  The specimen was sent to the lab.  Wound was treated with  hyfrecator, Band-Aid with antibiotic wound ointment.  Wound instructions  provided.  Tolerated well.  Complications none.  Specimen sent to the lab.   Mole #2 measuring 5 mm medially from left shoulder blade was removed in an  identical fashion.  Specimen sent to the lab.   Mole #3 measuring 3 mm right lumbar area was removed in an identical  fashion.  Specimen sent to the lab.   Skin tag in the left axilla was removed in an identical fashion and  discarded.  No charge.   Cryosurgery.  Indication:  Actinic keratosis lesion right upper eyelid.  Risks and benefits explained to the patient in detail.  He agreed to  proceed.  The area was treated with liquid nitrogen in the usual fashion.  Tolerated well.  Complications none.            ______________________________  [name]. Plotnikov, MD      AVP/MedQ  DD:  06/08/2006  DT:  06/09/2006  Job #:  [id number]

## 2011-01-10 ENCOUNTER — Other Ambulatory Visit: Payer: Self-pay | Admitting: Internal Medicine

## 2011-03-31 ENCOUNTER — Ambulatory Visit: Payer: Medicare Other

## 2011-03-31 DIAGNOSIS — Z0000 Encounter for general adult medical examination without abnormal findings: Secondary | ICD-10-CM

## 2011-03-31 DIAGNOSIS — Z0389 Encounter for observation for other suspected diseases and conditions ruled out: Secondary | ICD-10-CM

## 2011-03-31 LAB — CBC WITH DIFFERENTIAL/PLATELET
Basophils Relative: 0.9 % (ref 0.0–3.0)
Eosinophils Relative: 1.9 % (ref 0.0–5.0)
Lymphocytes Relative: 29.5 % (ref 12.0–46.0)
Lymphs Abs: 1.5 10*3/uL (ref 0.7–4.0)
MCHC: 33.8 g/dL (ref 30.0–36.0)
MCV: 90.9 fl (ref 78.0–100.0)
Monocytes Relative: 7.6 % (ref 3.0–12.0)
Neutro Abs: 3.1 10*3/uL (ref 1.4–7.7)
Neutrophils Relative %: 60.1 % (ref 43.0–77.0)
RBC: 4.63 Mil/uL (ref 4.22–5.81)
RDW: 13.5 % (ref 11.5–14.6)

## 2011-03-31 LAB — LDL CHOLESTEROL, DIRECT: Direct LDL: 121.2 mg/dL

## 2011-03-31 LAB — BASIC METABOLIC PANEL
BUN: 16 mg/dL (ref 6–23)
CO2: 28 mEq/L (ref 19–32)
Calcium: 9.3 mg/dL (ref 8.4–10.5)
Creatinine, Ser: 1.1 mg/dL (ref 0.4–1.5)
GFR: 70.7 mL/min (ref >60.00)
Glucose, Bld: 92 mg/dL (ref 70–99)
Sodium: 142 mEq/L (ref 135–145)

## 2011-03-31 LAB — URINALYSIS
Bilirubin Urine: NEGATIVE
Total Protein, Urine: NEGATIVE
Urobilinogen, UA: 0.2 (ref 0.0–1.0)
pH: 7 (ref 5.0–8.0)

## 2011-03-31 LAB — LIPID PANEL: HDL: 37.5 mg/dL — ABNORMAL LOW (ref >39.00)

## 2011-03-31 LAB — HEPATIC FUNCTION PANEL
Bilirubin, Direct: 0.1 mg/dL (ref 0.0–0.3)
Total Protein: 6.5 g/dL (ref 6.0–8.3)

## 2011-03-31 LAB — PSA: PSA: 5.12 ng/mL — ABNORMAL HIGH (ref 0.10–4.00)

## 2011-04-07 ENCOUNTER — Ambulatory Visit (INDEPENDENT_AMBULATORY_CARE_PROVIDER_SITE_OTHER): Payer: Medicare Other | Admitting: Internal Medicine

## 2011-04-07 ENCOUNTER — Encounter: Payer: Self-pay | Admitting: Internal Medicine

## 2011-04-07 DIAGNOSIS — G8929 Other chronic pain: Secondary | ICD-10-CM

## 2011-04-07 DIAGNOSIS — M25511 Pain in right shoulder: Secondary | ICD-10-CM

## 2011-04-07 DIAGNOSIS — M25519 Pain in unspecified shoulder: Secondary | ICD-10-CM

## 2011-04-07 DIAGNOSIS — M542 Cervicalgia: Secondary | ICD-10-CM

## 2011-04-07 DIAGNOSIS — R51 Headache: Secondary | ICD-10-CM

## 2011-04-07 DIAGNOSIS — I10 Essential (primary) hypertension: Secondary | ICD-10-CM

## 2011-04-07 MED ORDER — MELOXICAM 15 MG PO TABS: 15.0000 mg | ORAL_TABLET | Freq: Every day | ORAL | Status: AC | PRN

## 2011-04-07 NOTE — Assessment & Plan Note (Addendum)
Cont Rx 

## 2011-04-07 NOTE — Assessment & Plan Note (Addendum)
Meloxicam Inject in 1 mo if needed

## 2011-04-07 NOTE — Assessment & Plan Note (Addendum)
Start yoga Will inject in 1 mo if needed Meloxicam

## 2011-04-07 NOTE — Assessment & Plan Note (Addendum)
Yoga advised. Exercises given Meds PRN

## 2011-04-07 NOTE — Progress Notes (Signed)
  Subjective:    Patient ID: Curtis Jacobs, male    DOB: 04-08-1933, 77 y.o.   MRN: [id number]  HPI  The patient presents for a follow-up of  chronic hypertension, chronic dyslipidemia, BPH controlled with medicines    Review of Systems  Constitutional: Negative for appetite change, fatigue and unexpected weight change.  HENT: Negative for nosebleeds, congestion, sore throat, sneezing, trouble swallowing and neck pain.   Eyes: Negative for itching and visual disturbance.  Respiratory: Negative for cough.   Cardiovascular: Negative for chest pain, palpitations and leg swelling.  Gastrointestinal: Negative for nausea, diarrhea, blood in stool and abdominal distention.  Genitourinary: Negative for frequency and hematuria.  Musculoskeletal: Negative for back pain, joint swelling and gait problem.  Skin: Negative for rash.  Neurological: Negative for dizziness, tremors, speech difficulty and weakness.  Psychiatric/Behavioral: Negative for sleep disturbance, dysphoric mood and agitation. The patient is not nervous/anxious.        Objective:   Physical Exam  Constitutional: He is oriented to person, place, and time. He appears well-developed.  HENT:  Mouth/Throat: Oropharynx is clear and moist.  Eyes: Conjunctivae are normal. Pupils are equal, round, and reactive to light.  Neck: Normal range of motion. No JVD present. No thyromegaly present.  Cardiovascular: Normal rate, regular rhythm, normal heart sounds and intact distal pulses.  Exam reveals no gallop and no friction rub.   No murmur heard. Pulmonary/Chest: Effort normal and breath sounds normal. No respiratory distress. He has no wheezes. He has no rales. He exhibits no tenderness.  Abdominal: Soft. Bowel sounds are normal. He exhibits no distension and no mass. There is no tenderness. There is no rebound and no guarding.  Musculoskeletal: Normal range of motion. He exhibits tenderness (R bicip tendon is painfull. Neck is stiff and  ). He exhibits no edema.  Lymphadenopathy:    He has no cervical adenopathy.  Neurological: He is alert and oriented to person, place, and time. He has normal reflexes. No cranial nerve deficit. He exhibits normal muscle tone. Coordination normal.  Skin: Skin is warm and dry. No rash noted.  Psychiatric: He has a normal mood and affect. His behavior is normal. Judgment and thought content normal.    Lab Results  Component Value Date   WBC 5.2 03/31/2011   HGB 14.2 03/31/2011   HCT 42.1 03/31/2011   PLT 156.0 03/31/2011   CHOL 210* 03/31/2011   TRIG 235.0* 03/31/2011   HDL 37.50* 03/31/2011   LDLDIRECT 121.2 03/31/2011   ALT 19 03/31/2011   AST 17 03/31/2011   NA 142 03/31/2011   K 4.3 03/31/2011   CL 108 03/31/2011   CREATININE 1.1 03/31/2011   BUN 16 03/31/2011   CO2 28 03/31/2011   TSH 1.98 03/31/2011   PSA 5.12* 03/31/2011         Assessment & Plan:

## 2011-05-12 ENCOUNTER — Ambulatory Visit: Payer: Medicare Other | Admitting: Internal Medicine

## 2011-05-29 LAB — BASIC METABOLIC PANEL
Calcium: 9.3
Creatinine, Ser: 1.22
GFR calc Af Amer: >60
GFR calc non Af Amer: 59 — ABNORMAL LOW

## 2011-05-29 LAB — CBC
MCHC: 34.3
RBC: 4.71
WBC: 4.9

## 2011-05-29 LAB — DIFFERENTIAL
Basophils Relative: 1
Lymphs Abs: 1.5
Monocytes Relative: 7
Neutro Abs: 2.9
Neutrophils Relative %: 59

## 2011-07-13 ENCOUNTER — Other Ambulatory Visit: Payer: Self-pay | Admitting: Internal Medicine

## 2011-09-13 ENCOUNTER — Other Ambulatory Visit: Payer: Self-pay | Admitting: Internal Medicine

## 2011-09-22 ENCOUNTER — Other Ambulatory Visit: Payer: Self-pay | Admitting: Dermatology

## 2012-01-07 ENCOUNTER — Other Ambulatory Visit: Payer: Self-pay | Admitting: Dermatology

## 2012-01-16 ENCOUNTER — Other Ambulatory Visit: Payer: Self-pay | Admitting: Internal Medicine

## 2012-02-09 ENCOUNTER — Encounter: Payer: Self-pay | Admitting: Internal Medicine

## 2012-02-09 ENCOUNTER — Telehealth: Payer: Self-pay | Admitting: Internal Medicine

## 2012-02-09 ENCOUNTER — Ambulatory Visit (INDEPENDENT_AMBULATORY_CARE_PROVIDER_SITE_OTHER): Payer: Medicare Other | Admitting: Internal Medicine

## 2012-02-09 VITALS — BP 140/86 | HR 84 | Temp 98.5°F | Resp 16 | Wt 195.0 lb

## 2012-02-09 DIAGNOSIS — L259 Unspecified contact dermatitis, unspecified cause: Secondary | ICD-10-CM

## 2012-02-09 DIAGNOSIS — M25519 Pain in unspecified shoulder: Secondary | ICD-10-CM

## 2012-02-09 DIAGNOSIS — M7989 Other specified soft tissue disorders: Secondary | ICD-10-CM

## 2012-02-09 DIAGNOSIS — L02419 Cutaneous abscess of limb, unspecified: Secondary | ICD-10-CM

## 2012-02-09 DIAGNOSIS — M7520 Bicipital tendinitis, unspecified shoulder: Secondary | ICD-10-CM

## 2012-02-09 DIAGNOSIS — L309 Dermatitis, unspecified: Secondary | ICD-10-CM

## 2012-02-09 MED ORDER — DOXYCYCLINE HYCLATE 50 MG PO CAPS: 50.0000 mg | ORAL_CAPSULE | Freq: Two times a day (BID) | ORAL | Status: AC

## 2012-02-09 NOTE — Telephone Encounter (Signed)
If can be worked in today, call Deanna's cell at [phone] or lmom on home phone.

## 2012-02-09 NOTE — Telephone Encounter (Signed)
Pt will come at 3:30

## 2012-02-09 NOTE — Patient Instructions (Addendum)
     Wound instructions provided.   Wound instructions : change dressing once a day or twice a day is needed. Change dressing after  shower in the morning.  Pat dry the wound with gauze. Pull out all of packing tomorrow . Re-dress wound with antibiotic ointment and Telfa pad or a Band-Aid of appropriate size.   Please contact us if you notice a recollection of pus in the abscess fever and chills increased pain redness red streaks near the abscess increased swelling in the area.

## 2012-02-09 NOTE — Telephone Encounter (Signed)
I can see him at 3:30 today Thx

## 2012-02-09 NOTE — Telephone Encounter (Signed)
Caller: Deanna/Spouse; PCP: Plotnikov, Alex; CB#: [phone]; Call regarding Hard Painful Lump On Posterior L. Thigh; Onset 02/06/12.  Afebrile. 1" oblong shaped, itchy & painful enlarging lump with redness and opening without drainage.  Started out as tiny blisters that scabbed. Also has ongoing rash on abdomen for 2-3 wks. Advised to see MD within 4 hrs for any skin lesion with s/s of worsening infections per Skin Lesions Guideline.  Unable to take only remaining appts today at [phone] with Dr Everardo All d/t has to drive wife with vertigo to MD appt now. Info noted and sent to [facility] Can pool for work in appt.  OFFICE PLEASE CALL BACK  ON CELL  [phone] or leave message on home answering machine.

## 2012-02-10 ENCOUNTER — Encounter: Payer: Self-pay | Admitting: Internal Medicine

## 2012-02-10 DIAGNOSIS — M25519 Pain in unspecified shoulder: Secondary | ICD-10-CM | POA: Insufficient documentation

## 2012-02-10 DIAGNOSIS — L309 Dermatitis, unspecified: Secondary | ICD-10-CM | POA: Insufficient documentation

## 2012-02-10 DIAGNOSIS — L02419 Cutaneous abscess of limb, unspecified: Secondary | ICD-10-CM | POA: Insufficient documentation

## 2012-02-10 MED ORDER — METHYLPREDNISOLONE ACETATE 80 MG/ML IJ SUSP: 40.0000 mg | Freq: Once | INTRAMUSCULAR | Status: DC

## 2012-02-10 NOTE — Assessment & Plan Note (Signed)
Continue with current prescription therapy as reflected on the Med list.  

## 2012-02-10 NOTE — Assessment & Plan Note (Signed)
He asked me to inject B shoulders

## 2012-02-10 NOTE — Progress Notes (Signed)
Patient ID: Curtis Jacobs, male   DOB: [birth year], 78 y.o.   MRN: [id number]  Subjective:    Patient ID: Curtis Jacobs, male    DOB: [birth year], 78 y.o.   MRN: [id number]  Rash This is a recurrent problem. The current episode started more than 1 month ago. The problem has been waxing and waning since onset. The affected locations include the abdomen, chest, back and torso. The rash is characterized by itchiness. He was exposed to nothing. Pertinent negatives include no congestion, cough, diarrhea, fatigue, fever or sore throat. Past treatments include anti-itch cream and topical steroids. The treatment provided mild relief. His past medical history is significant for eczema.  Shoulder Pain  The pain is present in the left shoulder and right shoulder. This is a recurrent problem. The current episode started more than 1 month ago. There has been no history of extremity trauma. The problem occurs daily. The problem has been gradually worsening. The quality of the pain is described as aching. The pain is at a severity of 5/10. The pain is moderate. Associated symptoms include a limited range of motion. Pertinent negatives include no fever. The symptoms are aggravated by activity. Family history does not include rheumatoid arthritis. There is no history of gout.  C/o swelling on the back of his L post thigh x 2-3 d painful  The patient presents for a follow-up of  chronic hypertension, chronic dyslipidemia, BPH controlled with medicines    Review of Systems  Constitutional: Negative for fever, appetite change, fatigue and unexpected weight change.  HENT: Negative for nosebleeds, congestion, sore throat, sneezing, trouble swallowing and neck pain.   Eyes: Negative for itching and visual disturbance.  Respiratory: Negative for cough.   Cardiovascular: Negative for chest pain, palpitations and leg swelling.  Gastrointestinal: Negative for nausea, diarrhea, blood in stool and abdominal distention.    Genitourinary: Negative for frequency and hematuria.  Musculoskeletal: Positive for arthralgias. Negative for back pain, joint swelling, gait problem and gout.  Skin: Positive for rash.  Neurological: Negative for dizziness, tremors, speech difficulty and weakness.  Psychiatric/Behavioral: Negative for disturbed wake/sleep cycle, dysphoric mood and agitation. The patient is not nervous/anxious.        Objective:   Physical Exam  Constitutional: He is oriented to person, place, and time. He appears well-developed and well-nourished.  HENT:  Mouth/Throat: Oropharynx is clear and moist.  Eyes: Conjunctivae are normal. Pupils are equal, round, and reactive to light.  Neck: Normal range of motion. No JVD present. No thyromegaly present.  Cardiovascular: Normal rate, regular rhythm, normal heart sounds and intact distal pulses.  Exam reveals no gallop and no friction rub.   No murmur heard. Pulmonary/Chest: Effort normal and breath sounds normal. No respiratory distress. He has no wheezes. He has no rales. He exhibits no tenderness.  Abdominal: Soft. Bowel sounds are normal. He exhibits no distension and no mass. There is no tenderness. There is no rebound and no guarding.  Musculoskeletal: He exhibits tenderness (R>L bicip tendon is painfull. Neck is less stiff and  less tender). He exhibits no edema.  Lymphadenopathy:    He has no cervical adenopathy.  Neurological: He is alert and oriented to person, place, and time. He has normal reflexes. No cranial nerve deficit. He exhibits normal muscle tone. Coordination normal.  Skin: Skin is warm and dry. No rash noted.       1.5 cm L thigh eryth swelling posteriorly  Psychiatric: He has a normal mood and affect. His  behavior is normal. Judgment and thought content normal.    Lab Results  Component Value Date   WBC 5.2 03/31/2011   HGB 14.2 03/31/2011   HCT 42.1 03/31/2011   PLT 156.0 03/31/2011   CHOL 210* 03/31/2011   TRIG 235.0* 03/31/2011    HDL 37.50* 03/31/2011   LDLDIRECT 121.2 03/31/2011   ALT 19 03/31/2011   AST 17 03/31/2011   NA 142 03/31/2011   K 4.3 03/31/2011   CL 108 03/31/2011   CREATININE 1.1 03/31/2011   BUN 16 03/31/2011   CO2 28 03/31/2011   TSH 1.98 03/31/2011   PSA 5.12* 03/31/2011   Procedure Note :    Procedure :   Point of care (POC) sonography examination   Indication: L post thigh lump on the L   Equipment used: Sonosite M-Turbo with HFL38x/13-6 MHz transducer linear probe. The images were stored in the unit and later transferred in storage.  The patient was placed in a standing position.  This study revealed a hypoechoicoval subcutaneous lesion in the prox post L thigh c/w abscess   Impression: L prox post thigh abscess  Procedure note:  Incision and Drainage of an Abscess   Indication : a localized collection of pus that is tender and not spontaneously resolving.    Risks including unsuccessful procedure , possible need for a repeat procedure due to pus accumulation, scar formation, and others as well as benefits were explained to the patient in detail. Written consent was obtained/signed.    The patient was placed in a decubitus position. The area of an abscess was prepped with povidone-iodine and draped in a sterile fashion. Local anesthesia with   2    cc of 2% lidocaine and epinephrine  was administered.  1 cm incision with #11strait blade was made. About 1 cc of purulent material was expressed. The abscess cavity was explored with a sterile hemostat and the walled- off pockets and septae were broken down bluntly. The cavity was irrigated with the rest of the anesthetic in the syringe and packed with 1.5 inches of  the iodoform gauze.   The wound was dressed with antibiotic ointment and Telfa pad.  Tolerated well. Complications: None.   Wound instructions provided.   Wound instructions : change dressing once a day or twice a day is needed. Change dressing after  shower in the morning.  Pat dry  the wound with gauze. Pull out one inch of packing everyday and cut it off. Re-dress wound with antibiotic ointment and Telfa pad or a Band-Aid of appropriate size.   Please contact us if you notice a recollection of pus in the abscess fever and chills increased pain redness red streaks near the abscess increased swelling in the area.    Procedure :  Biceps tendon injection B   Indication:  Bicipital tendonitis with refractory  chronic pain.   Risks including unsuccessful procedure , bleeding, infection, bruising, skin atrophy and others were explained to the patient in detail as well as the benefits. Informed consent was obtained and signed.   Tthe patient was placed in a comfortable position. Skin was prepped with Betadine and alcohol  and anesthetized with a cooling spray. Then, a 5 cc syringe with a 1.5 inch long 25-gauge needle was used for a tendon injection with 2 mL of 2% lidocaine and 20 mg of Depo-Medrol .  Band-Aid was applied.  Procedure was repeated on the other side    Tolerated well. Complications: None. Good pain relief following  the procedure.   Postprocedure instructions :    A Band-Aid should be left on for 12 hours. Injection therapy is not a cure itself. It is used in conjunction with other modalities. You can use nonsteroidal anti-inflammatories like ibuprofen , hot and cold compresses. Rest is recommended in the next 24 hours. You need to report immediately  if fever, chills or any signs of infection develop.       Assessment & Plan:

## 2012-02-10 NOTE — Assessment & Plan Note (Signed)
I&D Doxy

## 2012-03-17 ENCOUNTER — Other Ambulatory Visit: Payer: Self-pay | Admitting: Internal Medicine

## 2012-03-22 ENCOUNTER — Encounter: Payer: Self-pay | Admitting: Internal Medicine

## 2012-03-22 ENCOUNTER — Ambulatory Visit (INDEPENDENT_AMBULATORY_CARE_PROVIDER_SITE_OTHER): Payer: Medicare Other | Admitting: Internal Medicine

## 2012-03-22 VITALS — BP 138/80 | HR 80 | Temp 97.6°F | Resp 16 | Wt 196.0 lb

## 2012-03-22 DIAGNOSIS — M25519 Pain in unspecified shoulder: Secondary | ICD-10-CM

## 2012-03-22 DIAGNOSIS — I10 Essential (primary) hypertension: Secondary | ICD-10-CM

## 2012-03-22 DIAGNOSIS — N320 Bladder-neck obstruction: Secondary | ICD-10-CM

## 2012-03-22 DIAGNOSIS — R42 Dizziness and giddiness: Secondary | ICD-10-CM | POA: Insufficient documentation

## 2012-03-22 DIAGNOSIS — R972 Elevated prostate specific antigen [PSA]: Secondary | ICD-10-CM

## 2012-03-22 DIAGNOSIS — N400 Enlarged prostate without lower urinary tract symptoms: Secondary | ICD-10-CM

## 2012-03-22 DIAGNOSIS — E785 Hyperlipidemia, unspecified: Secondary | ICD-10-CM

## 2012-03-22 DIAGNOSIS — M7520 Bicipital tendinitis, unspecified shoulder: Secondary | ICD-10-CM

## 2012-03-22 DIAGNOSIS — IMO0001 Reserved for inherently not codable concepts without codable children: Secondary | ICD-10-CM

## 2012-03-22 MED ORDER — METHYLPREDNISOLONE ACETATE 80 MG/ML IJ SUSP: 40.0000 mg | Freq: Once | INTRAMUSCULAR | Status: DC

## 2012-03-22 MED ORDER — TAMSULOSIN HCL 0.4 MG PO CAPS: 0.4000 mg | ORAL_CAPSULE | Freq: Every day | ORAL | Status: DC

## 2012-03-22 NOTE — Assessment & Plan Note (Signed)
Change to flomax

## 2012-03-22 NOTE — Assessment & Plan Note (Signed)
Chronic  Repeat PSA

## 2012-03-22 NOTE — Assessment & Plan Note (Signed)
We injected R biceps tendon and deltoid trigger points x3 again w/steroids

## 2012-03-22 NOTE — Assessment & Plan Note (Signed)
Due to ortho BP drop from Doxazosin - will d/c Will use Flomax

## 2012-03-22 NOTE — Progress Notes (Signed)
Patient ID: Curtis Jacobs, male   DOB: 1932/12/17, 78 y.o.   MRN: [id number]  Subjective:    Patient ID: Curtis Jacobs, male    DOB: 1933/05/09, 78 y.o.   MRN: [id number]  Arm Pain  Pertinent negatives include no chest pain.   C/o dizziness w/bending over off and on x years C/o B shoulders pain - L is better after the shot The patient presents for a follow-up of  chronic hypertension, chronic dyslipidemia, BPH controlled with medicines    Review of Systems  Constitutional: Negative for appetite change, fatigue and unexpected weight change.  HENT: Negative for nosebleeds, congestion, sore throat, sneezing, trouble swallowing and neck pain.   Eyes: Negative for itching and visual disturbance.  Respiratory: Negative for cough.   Cardiovascular: Negative for chest pain, palpitations and leg swelling.  Gastrointestinal: Negative for nausea, diarrhea, blood in stool and abdominal distention.  Genitourinary: Negative for frequency and hematuria.  Musculoskeletal: Negative for back pain, joint swelling and gait problem.  Skin: Negative for rash.  Neurological: Negative for dizziness, tremors, speech difficulty and weakness.  Psychiatric/Behavioral: Negative for disturbed wake/sleep cycle, dysphoric mood and agitation. The patient is not nervous/anxious.        Objective:   Physical Exam  Constitutional: He is oriented to person, place, and time. He appears well-developed.  HENT:  Mouth/Throat: Oropharynx is clear and moist.  Eyes: Conjunctivae are normal. Pupils are equal, round, and reactive to light.  Neck: Normal range of motion. No JVD present. No thyromegaly present.  Cardiovascular: Normal rate, regular rhythm, normal heart sounds and intact distal pulses.  Exam reveals no gallop and no friction rub.   No murmur heard. Pulmonary/Chest: Effort normal and breath sounds normal. No respiratory distress. He has no wheezes. He has no rales. He exhibits no tenderness.  Abdominal: Soft.  Bowel sounds are normal. He exhibits no distension and no mass. There is no tenderness. There is no rebound and no guarding.  Musculoskeletal: Normal range of motion. He exhibits tenderness (R bicip tendon is painfull. Neck is stiff and ). He exhibits no edema.  Lymphadenopathy:    He has no cervical adenopathy.  Neurological: He is alert and oriented to person, place, and time. He has normal reflexes. No cranial nerve deficit. He exhibits normal muscle tone. Coordination normal.  Skin: Skin is warm and dry. No rash noted.  Psychiatric: He has a normal mood and affect. His behavior is normal. Judgment and thought content normal.  R deltoid is tender  Lab Results  Component Value Date   WBC 5.2 03/31/2011   HGB 14.2 03/31/2011   HCT 42.1 03/31/2011   PLT 156.0 03/31/2011   CHOL 210* 03/31/2011   TRIG 235.0* 03/31/2011   HDL 37.50* 03/31/2011   LDLDIRECT 121.2 03/31/2011   ALT 19 03/31/2011   AST 17 03/31/2011   NA 142 03/31/2011   K 4.3 03/31/2011   CL 108 03/31/2011   CREATININE 1.1 03/31/2011   BUN 16 03/31/2011   CO2 28 03/31/2011   TSH 1.98 03/31/2011   PSA 5.12* 03/31/2011    Procedure Note :    R deltoid trigger Point Injection; R biceps tendon injection   Indication : Focal tender area identifiable by the location without other identifiable neurologic or musculoskeletal finding or pathology, biceps tendonitis   Risks including unsuccessful procedure , bleeding, infection, bruising, skin atrophy and others were explained to the patient in detail as well as the benefits. Informed consent was obtained and signed.  Tthe patient was placed in a comfortable position.  4  points of maximum tenderness over deltoid muscle were marked and  the skin was prepped with Betadine and alcohol. 1 inch 25-gauge needle was used. The needle was advanced perpendicular to the skin. Each trigger point was injected with 1 mL of 2% lidocaine and 10 mg of Depo-Medrol in a usual fashion. R biceps tendon was injected  with 10 mg Depo and 1 cc of Lido.  Band-Aids applied.   Tolerated well. Complications: None. Good pain relief following the procedure.       Assessment & Plan:

## 2012-03-22 NOTE — Assessment & Plan Note (Signed)
Continue with current prescription therapy as reflected on the Med list.  

## 2012-06-14 ENCOUNTER — Other Ambulatory Visit (INDEPENDENT_AMBULATORY_CARE_PROVIDER_SITE_OTHER): Payer: Medicare Other

## 2012-06-14 DIAGNOSIS — N400 Enlarged prostate without lower urinary tract symptoms: Secondary | ICD-10-CM

## 2012-06-14 DIAGNOSIS — I10 Essential (primary) hypertension: Secondary | ICD-10-CM

## 2012-06-14 DIAGNOSIS — R42 Dizziness and giddiness: Secondary | ICD-10-CM

## 2012-06-14 DIAGNOSIS — E785 Hyperlipidemia, unspecified: Secondary | ICD-10-CM

## 2012-06-14 DIAGNOSIS — M25519 Pain in unspecified shoulder: Secondary | ICD-10-CM

## 2012-06-14 DIAGNOSIS — N320 Bladder-neck obstruction: Secondary | ICD-10-CM

## 2012-06-14 LAB — URINALYSIS
Nitrite: NEGATIVE
Specific Gravity, Urine: 1.02 (ref 1.000–1.030)
Total Protein, Urine: NEGATIVE
Urine Glucose: NEGATIVE
Urobilinogen, UA: 0.2 (ref 0.0–1.0)

## 2012-06-14 LAB — CBC WITH DIFFERENTIAL/PLATELET
Basophils Relative: 1 % (ref 0.0–3.0)
Eosinophils Absolute: 0.1 10*3/uL (ref 0.0–0.7)
HCT: 44.1 % (ref 39.0–52.0)
Hemoglobin: 14.4 g/dL (ref 13.0–17.0)
Lymphs Abs: 1.8 10*3/uL (ref 0.7–4.0)
MCHC: 32.8 g/dL (ref 30.0–36.0)
MCV: 92.3 fl (ref 78.0–100.0)
Monocytes Absolute: 0.4 10*3/uL (ref 0.1–1.0)
Neutro Abs: 2.9 10*3/uL (ref 1.4–7.7)
Neutrophils Relative %: 54.7 % (ref 43.0–77.0)
RBC: 4.78 Mil/uL (ref 4.22–5.81)

## 2012-06-14 LAB — LIPID PANEL
Cholesterol: 219 mg/dL — ABNORMAL HIGH (ref 0–200)
HDL: 35.5 mg/dL — ABNORMAL LOW (ref >39.00)
Total CHOL/HDL Ratio: 6
VLDL: 32.4 mg/dL (ref 0.0–40.0)

## 2012-06-14 LAB — BASIC METABOLIC PANEL
Calcium: 9 mg/dL (ref 8.4–10.5)
Chloride: 108 mEq/L (ref 96–112)
Creatinine, Ser: 1.1 mg/dL (ref 0.4–1.5)
GFR: 72.01 mL/min (ref >60.00)

## 2012-06-14 LAB — PSA: PSA: 4.75 ng/mL — ABNORMAL HIGH (ref 0.10–4.00)

## 2012-06-14 LAB — HEPATIC FUNCTION PANEL
AST: 19 U/L (ref 0–37)
Total Bilirubin: 0.8 mg/dL (ref 0.3–1.2)

## 2012-06-17 ENCOUNTER — Encounter: Payer: Self-pay | Admitting: Internal Medicine

## 2012-06-17 ENCOUNTER — Ambulatory Visit (INDEPENDENT_AMBULATORY_CARE_PROVIDER_SITE_OTHER): Payer: Medicare Other | Admitting: Internal Medicine

## 2012-06-17 VITALS — BP 162/80 | HR 67 | Temp 98.4°F | Resp 16 | Wt 197.0 lb

## 2012-06-17 DIAGNOSIS — Z0000 Encounter for general adult medical examination without abnormal findings: Secondary | ICD-10-CM

## 2012-06-17 DIAGNOSIS — Z23 Encounter for immunization: Secondary | ICD-10-CM

## 2012-06-17 DIAGNOSIS — E785 Hyperlipidemia, unspecified: Secondary | ICD-10-CM

## 2012-06-17 DIAGNOSIS — L259 Unspecified contact dermatitis, unspecified cause: Secondary | ICD-10-CM

## 2012-06-17 DIAGNOSIS — M25519 Pain in unspecified shoulder: Secondary | ICD-10-CM

## 2012-06-17 DIAGNOSIS — N400 Enlarged prostate without lower urinary tract symptoms: Secondary | ICD-10-CM

## 2012-06-17 DIAGNOSIS — L309 Dermatitis, unspecified: Secondary | ICD-10-CM

## 2012-06-17 DIAGNOSIS — I10 Essential (primary) hypertension: Secondary | ICD-10-CM

## 2012-06-17 DIAGNOSIS — D485 Neoplasm of uncertain behavior of skin: Secondary | ICD-10-CM

## 2012-06-17 MED ORDER — LOSARTAN POTASSIUM 100 MG PO TABS: 100.0000 mg | ORAL_TABLET | Freq: Every day | ORAL | Status: DC

## 2012-06-17 MED ORDER — TERAZOSIN HCL 10 MG PO CAPS: 10.0000 mg | ORAL_CAPSULE | Freq: Every day | ORAL | Status: DC

## 2012-06-17 NOTE — Assessment & Plan Note (Signed)
11/13 R lat shin R/o ca Derm appt pending

## 2012-06-17 NOTE — Assessment & Plan Note (Signed)
Continue with current prescription therapy as reflected on the Med list.  

## 2012-06-17 NOTE — Assessment & Plan Note (Signed)
Chronic  Dr Isabel Caprice -- s/p prostate bx ?2009 OK Start Terazosin due to BPH sx's

## 2012-06-17 NOTE — Assessment & Plan Note (Signed)
R>L bicipital tendonitis; subacr bursitis; rot cuff strain 6/13 - inj 8/13 inj 11/13 - worse  Ortho consult

## 2012-06-17 NOTE — Progress Notes (Signed)
   Subjective:    Patient ID: Curtis Jacobs, male    DOB: [birth year], 78 y.o.   MRN: [id number]  HPI The patient is here for a wellness exam.   The patient presents for a follow-up of  chronic hypertension, chronic dyslipidemia, BPH controlled with medicines C/o R shoulder pain - not better, clicking    Review of Systems  Constitutional: Negative for appetite change, fatigue and unexpected weight change.  HENT: Negative for nosebleeds, congestion, sore throat, sneezing, trouble swallowing and neck pain.   Eyes: Negative for itching and visual disturbance.  Respiratory: Negative for cough.   Cardiovascular: Negative for chest pain, palpitations and leg swelling.  Gastrointestinal: Negative for nausea, diarrhea, blood in stool and abdominal distention.  Genitourinary: Negative for frequency and hematuria.  Musculoskeletal: Negative for back pain, joint swelling and gait problem.  Skin: Negative for rash.  Neurological: Negative for dizziness, tremors, speech difficulty and weakness.  Psychiatric/Behavioral: Negative for disturbed wake/sleep cycle, dysphoric mood and agitation. The patient is not nervous/anxious.        Objective:   Physical Exam  Constitutional: He is oriented to person, place, and time. He appears well-developed.  HENT:  Mouth/Throat: Oropharynx is clear and moist.  Eyes: Conjunctivae normal are normal. Pupils are equal, round, and reactive to light.  Neck: Normal range of motion. No JVD present. No thyromegaly present.  Cardiovascular: Normal rate, regular rhythm, normal heart sounds and intact distal pulses.  Exam reveals no gallop and no friction rub.   No murmur heard. Pulmonary/Chest: Effort normal and breath sounds normal. No respiratory distress. He has no wheezes. He has no rales. He exhibits no tenderness.  Abdominal: Soft. Bowel sounds are normal. He exhibits no distension and no mass. There is no tenderness. There is no rebound and no guarding.    Musculoskeletal: Normal range of motion. He exhibits tenderness (R bicip tendon is painfull. Neck is stiff and ). He exhibits no edema.  Lymphadenopathy:    He has no cervical adenopathy.  Neurological: He is alert and oriented to person, place, and time. He has normal reflexes. No cranial nerve deficit. He exhibits normal muscle tone. Coordination normal.  Skin: Skin is warm and dry. No rash noted.  Psychiatric: He has a normal mood and affect. His behavior is normal. Judgment and thought content normal.  R shoulder is tender R lat shin w/a 11 mm papule  Lab Results  Component Value Date   WBC 5.3 06/14/2012   HGB 14.4 06/14/2012   HCT 44.1 06/14/2012   PLT 172.0 06/14/2012   CHOL 219* 06/14/2012   TRIG 162.0* 06/14/2012   HDL 35.50* 06/14/2012   LDLDIRECT 146.0 06/14/2012   ALT 19 06/14/2012   AST 19 06/14/2012   NA 140 06/14/2012   K 4.3 06/14/2012   CL 108 06/14/2012   CREATININE 1.1 06/14/2012   BUN 15 06/14/2012   CO2 27 06/14/2012   TSH 2.30 06/14/2012   PSA 4.75* 06/14/2012         Assessment & Plan:

## 2012-06-17 NOTE — Assessment & Plan Note (Signed)
The patient is here for annual Medicare wellness examination and management of other chronic and acute problems.   The risk factors are reflected in the social history.  The roster of all physicians providing medical care to patient - is listed in the Snapshot section of the chart.  Activities of daily living:  The patient is 100% inedpendent in all ADLs: dressing, toileting, feeding as well as independent mobility  Home safety : good. Using seatbelts. There is no violence in the home.   There is no risks for hepatitis, STDs or HIV. There is no history of blood transfusion. They have no travel history to infectious disease endemic areas of the world.  The patient has  seen their dentist in the last 12 month. They have  seen their eye doctor in the last year. They deny  Any major hearing difficulty and have not had audiologic testing in the last year.  They do not  have excessive sun exposure. Discussed the need for sun protection: hats, long sleeves and use of sunscreen if there is significant sun exposure.   Diet: the importance of a healthy diet is discussed. They do have a reasonably healthy  diet.  The patient has a fairly regular exercise program of a mixed nature: walking, yard work, etc.The benefits of regular aerobic exercise were discussed.  Depression screen: there are no signs or vegative symptoms of depression- irritability, change in appetite, anhedonia, sadness/tearfullness.  Cognitive assessment: the patient manages all their financial and personal affairs and is actively engaged. They could relate day,date,year and events; recalled 3/3 objects at 3 minutes  The following portions of the patient's history were reviewed and updated as appropriate: allergies, current medications, past family history, past medical history,  past surgical history, past social history  and problem list.  Vision, hearing, body mass index were assessed and reviewed.   During the course of the visit  the patient was educated and counseled about appropriate screening and preventive services including : fall prevention , diabetes screening, nutrition counseling, colorectal cancer screening, and recommended immunizations. dT

## 2012-06-17 NOTE — Assessment & Plan Note (Signed)
Chronic  He declined statins

## 2012-06-19 ENCOUNTER — Encounter: Payer: Self-pay | Admitting: Internal Medicine

## 2012-08-24 ENCOUNTER — Other Ambulatory Visit: Payer: Self-pay | Admitting: Dermatology

## 2012-09-23 ENCOUNTER — Ambulatory Visit (INDEPENDENT_AMBULATORY_CARE_PROVIDER_SITE_OTHER): Payer: Medicare Other | Admitting: Internal Medicine

## 2012-09-23 ENCOUNTER — Encounter: Payer: Self-pay | Admitting: Internal Medicine

## 2012-09-23 VITALS — BP 168/90 | HR 88 | Temp 98.4°F | Resp 16 | Wt 200.0 lb

## 2012-09-23 DIAGNOSIS — L309 Dermatitis, unspecified: Secondary | ICD-10-CM

## 2012-09-23 DIAGNOSIS — E785 Hyperlipidemia, unspecified: Secondary | ICD-10-CM

## 2012-09-23 DIAGNOSIS — L259 Unspecified contact dermatitis, unspecified cause: Secondary | ICD-10-CM

## 2012-09-23 DIAGNOSIS — I10 Essential (primary) hypertension: Secondary | ICD-10-CM

## 2012-09-23 MED ORDER — TERAZOSIN HCL 10 MG PO CAPS: 10.0000 mg | ORAL_CAPSULE | Freq: Every day | ORAL | Status: DC

## 2012-09-23 MED ORDER — LOSARTAN POTASSIUM 100 MG PO TABS: 100.0000 mg | ORAL_TABLET | Freq: Every day | ORAL | Status: DC

## 2012-09-23 MED ORDER — FLUTICASONE PROPIONATE 50 MCG/ACT NA SUSP: 2.0000 | Freq: Every day | NASAL | Status: DC

## 2012-09-23 NOTE — Assessment & Plan Note (Signed)
Chronic, s/p derm consult 2012, 2013 - "dermatitis" - steroids prn IM

## 2012-09-23 NOTE — Assessment & Plan Note (Signed)
  On diet  

## 2012-09-23 NOTE — Assessment & Plan Note (Signed)
Continue with current prescription therapy as reflected on the Med list.  

## 2012-09-23 NOTE — Progress Notes (Signed)
   Subjective:     HPI   The patient presents for a follow-up of  chronic hypertension, chronic dyslipidemia, BPH controlled with medicines C/o R shoulder pain - not better, clicking F/u rash - he went to [facility]    Review of Systems  Constitutional: Negative for appetite change, fatigue and unexpected weight change.  HENT: Negative for nosebleeds, congestion, sore throat, sneezing, trouble swallowing and neck pain.   Eyes: Negative for itching and visual disturbance.  Respiratory: Negative for cough.   Cardiovascular: Negative for chest pain, palpitations and leg swelling.  Gastrointestinal: Negative for nausea, diarrhea, blood in stool and abdominal distention.  Genitourinary: Negative for frequency and hematuria.  Musculoskeletal: Negative for back pain, joint swelling and gait problem.  Skin: Negative for rash.  Neurological: Negative for dizziness, tremors, speech difficulty and weakness.  Psychiatric/Behavioral: Negative for sleep disturbance, dysphoric mood and agitation. The patient is not nervous/anxious.        Objective:   Physical Exam  Constitutional: He is oriented to person, place, and time. He appears well-developed.  HENT:  Mouth/Throat: Oropharynx is clear and moist.  Eyes: Conjunctivae normal are normal. Pupils are equal, round, and reactive to light.  Neck: Normal range of motion. No JVD present. No thyromegaly present.  Cardiovascular: Normal rate, regular rhythm, normal heart sounds and intact distal pulses.  Exam reveals no gallop and no friction rub.   No murmur heard. Pulmonary/Chest: Effort normal and breath sounds normal. No respiratory distress. He has no wheezes. He has no rales. He exhibits no tenderness.  Abdominal: Soft. Bowel sounds are normal. He exhibits no distension and no mass. There is no tenderness. There is no rebound and no guarding.  Musculoskeletal: Normal range of motion. He exhibits tenderness (R bicip tendon is painfull.  Neck is stiff and ). He exhibits no edema.  Lymphadenopathy:    He has no cervical adenopathy.  Neurological: He is alert and oriented to person, place, and time. He has normal reflexes. No cranial nerve deficit. He exhibits normal muscle tone. Coordination normal.  Skin: Skin is warm and dry. No rash noted.  Psychiatric: He has a normal mood and affect. His behavior is normal. Judgment and thought content normal.    Lab Results  Component Value Date   WBC 5.3 06/14/2012   HGB 14.4 06/14/2012   HCT 44.1 06/14/2012   PLT 172.0 06/14/2012   CHOL 219* 06/14/2012   TRIG 162.0* 06/14/2012   HDL 35.50* 06/14/2012   LDLDIRECT 146.0 06/14/2012   ALT 19 06/14/2012   AST 19 06/14/2012   NA 140 06/14/2012   K 4.3 06/14/2012   CL 108 06/14/2012   CREATININE 1.1 06/14/2012   BUN 15 06/14/2012   CO2 27 06/14/2012   TSH 2.30 06/14/2012   PSA 4.75* 06/14/2012         Assessment & Plan:

## 2013-04-24 ENCOUNTER — Other Ambulatory Visit: Payer: Self-pay | Admitting: Dermatology

## 2013-06-20 ENCOUNTER — Encounter: Payer: Medicare Other | Admitting: Internal Medicine

## 2013-06-21 ENCOUNTER — Telehealth: Payer: Self-pay | Admitting: *Deleted

## 2013-06-21 NOTE — Telephone Encounter (Signed)
Left detailed mess informing pt he does not need to have his CPE labs drawn prior to his OV on 07/07/13. He can do labs after his appt with MD.

## 2013-07-07 ENCOUNTER — Encounter: Payer: Self-pay | Admitting: Internal Medicine

## 2013-07-07 ENCOUNTER — Ambulatory Visit (INDEPENDENT_AMBULATORY_CARE_PROVIDER_SITE_OTHER): Payer: Medicare Other | Admitting: Internal Medicine

## 2013-07-07 ENCOUNTER — Ambulatory Visit (INDEPENDENT_AMBULATORY_CARE_PROVIDER_SITE_OTHER)
Admission: RE | Admit: 2013-07-07 | Discharge: 2013-07-07 | Disposition: A | Discharge status: Home / Self Care | Payer: Medicare Other | Source: Ambulatory Visit | Attending: Internal Medicine | Admitting: Internal Medicine

## 2013-07-07 VITALS — BP 140/70 | HR 80 | Temp 98.4°F | Resp 16 | Ht 70.0 in | Wt 208.0 lb

## 2013-07-07 DIAGNOSIS — Z23 Encounter for immunization: Secondary | ICD-10-CM

## 2013-07-07 DIAGNOSIS — R918 Other nonspecific abnormal finding of lung field: Secondary | ICD-10-CM

## 2013-07-07 DIAGNOSIS — R05 Cough: Secondary | ICD-10-CM

## 2013-07-07 DIAGNOSIS — R9389 Abnormal findings on diagnostic imaging of other specified body structures: Secondary | ICD-10-CM

## 2013-07-07 DIAGNOSIS — I10 Essential (primary) hypertension: Secondary | ICD-10-CM

## 2013-07-07 DIAGNOSIS — R059 Cough, unspecified: Secondary | ICD-10-CM

## 2013-07-07 DIAGNOSIS — M25519 Pain in unspecified shoulder: Secondary | ICD-10-CM

## 2013-07-07 DIAGNOSIS — Z0000 Encounter for general adult medical examination without abnormal findings: Secondary | ICD-10-CM

## 2013-07-07 DIAGNOSIS — R972 Elevated prostate specific antigen [PSA]: Secondary | ICD-10-CM

## 2013-07-07 DIAGNOSIS — M25511 Pain in right shoulder: Secondary | ICD-10-CM

## 2013-07-07 MED ORDER — FLUTICASONE PROPIONATE 50 MCG/ACT NA SUSP: 2.0000 | Freq: Every day | NASAL | Status: DC

## 2013-07-07 MED ORDER — AMOXICILLIN 500 MG PO CAPS: 1000.0000 mg | ORAL_CAPSULE | Freq: Two times a day (BID) | ORAL | Status: DC

## 2013-07-07 NOTE — Assessment & Plan Note (Addendum)
Declined meds °

## 2013-07-07 NOTE — Assessment & Plan Note (Signed)
Continue with current prescription therapy as reflected on the Med list.  

## 2013-07-07 NOTE — Progress Notes (Signed)
Subjective:   HPI  The patient is here for a wellness exam.   The patient presents for a follow-up of  chronic hypertension, chronic dyslipidemia, BPH controlled with medicines C/o R shoulder pain - not better, clicking    Review of Systems  Constitutional: Negative for appetite change, fatigue and unexpected weight change.  HENT: Negative for congestion, nosebleeds, sneezing, sore throat and trouble swallowing.   Eyes: Negative for itching and visual disturbance.  Respiratory: Negative for cough.   Cardiovascular: Negative for chest pain, palpitations and leg swelling.  Gastrointestinal: Negative for nausea, diarrhea, blood in stool and abdominal distention.  Genitourinary: Negative for frequency and hematuria.  Musculoskeletal: Negative for back pain, gait problem, joint swelling and neck pain.  Skin: Negative for rash.  Neurological: Negative for dizziness, tremors, speech difficulty and weakness.  Psychiatric/Behavioral: Negative for sleep disturbance, dysphoric mood and agitation. The patient is not nervous/anxious.        Objective:   Physical Exam  Constitutional: He is oriented to person, place, and time. He appears well-developed.  HENT:  Mouth/Throat: Oropharynx is clear and moist.  Eyes: Conjunctivae are normal. Pupils are equal, round, and reactive to light.  Neck: Normal range of motion. No JVD present. No thyromegaly present.  Cardiovascular: Normal rate, regular rhythm, normal heart sounds and intact distal pulses.  Exam reveals no gallop and no friction rub.   No murmur heard. Pulmonary/Chest: Effort normal and breath sounds normal. No respiratory distress. He has no wheezes. He has no rales. He exhibits no tenderness.  Abdominal: Soft. Bowel sounds are normal. He exhibits no distension and no mass. There is no tenderness. There is no rebound and no guarding.  Musculoskeletal: Normal range of motion. He exhibits tenderness (R bicip tendon is painfull. Neck is  stiff and ). He exhibits no edema.  Lymphadenopathy:    He has no cervical adenopathy.  Neurological: He is alert and oriented to person, place, and time. He has normal reflexes. No cranial nerve deficit. He exhibits normal muscle tone. Coordination normal.  Skin: Skin is warm and dry. No rash noted.  Psychiatric: He has a normal mood and affect. His behavior is normal. Judgment and thought content normal.  R shoulder is tender - no change R lat shin w/a 11 mm papule  Lab Results  Component Value Date   WBC 5.3 06/14/2012   HGB 14.4 06/14/2012   HCT 44.1 06/14/2012   PLT 172.0 06/14/2012   CHOL 219* 06/14/2012   TRIG 162.0* 06/14/2012   HDL 35.50* 06/14/2012   LDLDIRECT 146.0 06/14/2012   ALT 19 06/14/2012   AST 19 06/14/2012   NA 140 06/14/2012   K 4.3 06/14/2012   CL 108 06/14/2012   CREATININE 1.1 06/14/2012   BUN 15 06/14/2012   CO2 27 06/14/2012   TSH 2.30 06/14/2012   PSA 4.75* 06/14/2012     Procedure :  Biceps tendon injection R   Indication:  Bicipital tendonitis with refractory  chronic pain.   Risks including unsuccessful procedure , bleeding, infection, bruising, skin atrophy and others were explained to the patient in detail as well as the benefits. Informed consent was obtained and signed.   Tthe patient was placed in a comfortable position. Skin was prepped with Betadine and alcohol  and anesthetized with a cooling spray. Then, a 5 cc syringe with a 1.5 inch long 25-gauge needle was used for a tendon injection with 2 mL of 2% lidocaine and 20 mg of Depo-Medrol .  Band-Aid  was applied.     Tolerated well. Complications: None. Good pain relief following the procedure.          Assessment & Plan:

## 2013-07-07 NOTE — Assessment & Plan Note (Signed)
chek labs

## 2013-07-07 NOTE — Patient Instructions (Addendum)
   Milk free trial (no milk, ice cream, cheese and yogurt) for 4-6 weeks. OK to use almond, coconut, rice milk. "Almond breeze" brand tastes good.   Postprocedure instructions :    A Band-Aid should be left on for 12 hours. Injection therapy is not a cure itself. It is used in conjunction with other modalities. You can use nonsteroidal anti-inflammatories like ibuprofen , hot and cold compresses. Rest is recommended in the next 24 hours. You need to report immediately  if fever, chills or any signs of infection develop.

## 2013-07-07 NOTE — Progress Notes (Signed)
Pre visit review using our clinic review tool, if applicable. No additional management support is needed unless otherwise documented below in the visit note. 

## 2013-07-07 NOTE — Assessment & Plan Note (Signed)

## 2013-07-08 DIAGNOSIS — R911 Solitary pulmonary nodule: Secondary | ICD-10-CM | POA: Insufficient documentation

## 2013-07-08 DIAGNOSIS — R918 Other nonspecific abnormal finding of lung field: Secondary | ICD-10-CM | POA: Insufficient documentation

## 2013-07-08 MED ORDER — METHYLPREDNISOLONE ACETATE 20 MG/ML IJ SUSP: 20.0000 mg | Freq: Once | INTRAMUSCULAR | Status: DC

## 2013-07-08 NOTE — Assessment & Plan Note (Signed)
[place] chest CT

## 2013-07-14 ENCOUNTER — Ambulatory Visit (INDEPENDENT_AMBULATORY_CARE_PROVIDER_SITE_OTHER)
Admission: RE | Admit: 2013-07-14 | Discharge: 2013-07-14 | Disposition: A | Discharge status: Home / Self Care | Payer: Medicare Other | Source: Ambulatory Visit | Attending: Internal Medicine | Admitting: Internal Medicine

## 2013-07-14 DIAGNOSIS — R918 Other nonspecific abnormal finding of lung field: Secondary | ICD-10-CM

## 2013-07-14 DIAGNOSIS — R9389 Abnormal findings on diagnostic imaging of other specified body structures: Secondary | ICD-10-CM

## 2013-07-24 ENCOUNTER — Other Ambulatory Visit: Payer: Self-pay | Admitting: Internal Medicine

## 2013-07-24 DIAGNOSIS — R911 Solitary pulmonary nodule: Secondary | ICD-10-CM

## 2013-07-25 ENCOUNTER — Ambulatory Visit (INDEPENDENT_AMBULATORY_CARE_PROVIDER_SITE_OTHER): Payer: Medicare Other | Admitting: Pulmonary Disease

## 2013-07-25 ENCOUNTER — Encounter: Payer: Self-pay | Admitting: Pulmonary Disease

## 2013-07-25 VITALS — BP 142/86 | HR 70 | Ht 70.0 in | Wt 207.0 lb

## 2013-07-25 DIAGNOSIS — R9389 Abnormal findings on diagnostic imaging of other specified body structures: Secondary | ICD-10-CM

## 2013-07-25 DIAGNOSIS — J479 Bronchiectasis, uncomplicated: Secondary | ICD-10-CM

## 2013-07-25 DIAGNOSIS — R918 Other nonspecific abnormal finding of lung field: Secondary | ICD-10-CM

## 2013-07-25 DIAGNOSIS — R911 Solitary pulmonary nodule: Secondary | ICD-10-CM

## 2013-07-25 MED ORDER — FLUTTER DEVI: 1.0000 | Freq: Three times a day (TID) | Status: DC

## 2013-07-25 MED ORDER — AMOXICILLIN-POT CLAVULANATE 875-125 MG PO TABS: 1.0000 | ORAL_TABLET | Freq: Two times a day (BID) | ORAL | Status: AC

## 2013-07-25 NOTE — Assessment & Plan Note (Signed)
It sounds like this is post infectious as he had a nasty episode of pneumonia in the 1970's which left him hospitalized for two weeks.  Ever since then he has had a chronic cough and congestion.  This is the cause of his cough.   Plan: -spirometry today -flutter valve tid -instructed on chest PT technique -sputum culture if able to provide -add Dulera two puffs bid if no improvement in two weeks (sample given)

## 2013-07-25 NOTE — Progress Notes (Signed)
Subjective:    Patient ID: Curtis Jacobs, male    DOB: 06-25-1933, 79 y.o.   MRN: [id number]  HPI Curtis Jacobs is here today because he has a pulmonary nodule.  He has previously been followed with Curtis Jacobs here for a pulmonary nodule.  He said that in 2000 he had an operation on the right lung by Curtis Jacobs for three nodules.  He had what sounds like a walled off infection at that time.  He was seen by Curtis Jacobs for this left lung nodule 5-10 years ago.    He notes that he was hospitalized in the 1970's for a week or two for a bad episode of pneumonia where he thinkgs that he almost died.  Ever since then he has had a progressively worsening cough with mucus production every morning.  No recent chest pain or hemoptysis.  He was recently prescribed amoxicillin for bronchitis symptoms.  His chest congestion and cough have been worse in the mornings.   He notes a lot of sinus congestion over the years.    He thinks he may have lit 2-3 cigarettes as a young man but never really smoked.  Past Medical History  Diagnosis Date  . BPH (benign prostatic hypertrophy)     Curtis Jacobs  . Hyperlipidemia   . HTN (hypertension)   . HA (headache)     [place]- Curtis Jacobs- had MRI.MRA     Family History  Problem Relation Age of Onset  . Heart disease Father      History   Social History  . Marital Status: Married    Spouse Name: N/A    Number of Children: N/A  . Years of Education: N/A   Occupational History  . Not on file.   Social History Main Topics  . Smoking status: Former Smoker -- 0.10 packs/day for 3 years    Types: Cigarettes    Quit date: 07/25/1952  . Smokeless tobacco: Not on file     Comment: used to "smoke but not inhale"  . Alcohol Use: No  . Drug Use: No  . Sexual Activity: Yes   Other Topics Concern  . Not on file   Social History Narrative  . No narrative on file     Allergies  Allergen Reactions  . Lisinopril     REACTION: cough     Outpatient  Prescriptions Prior to Visit  Medication Sig Dispense Refill  . Cholecalciferol 1000 UNITS tablet Take 1,000 Units by mouth daily.        . fluticasone (FLONASE) 50 MCG/ACT nasal spray Place 2 sprays into both nostrils daily.  16 g  3  . Multiple Vitamin (MULTIVITAMIN) tablet Take 1 tablet by mouth daily.        . niacin 500 MG CR capsule Take 500 mg by mouth at bedtime.        Curtis Jacobs by Does not apply route daily.        Marland Kitchen terazosin (HYTRIN) 10 MG capsule Take 1 capsule (10 mg total) by mouth at bedtime.  30 capsule  11  . Glucosamine-Chondroit-Vit C-Mn (GLUCOSAMINE CHONDROITIN COMPLX) CAPS Take 1 capsule by mouth daily.        . hydrOXYzine (ATARAX/VISTARIL) 25 MG tablet Take 25 mg by mouth daily.      Marland Kitchen losartan (COZAAR) 100 MG tablet Take 1 tablet (100 mg total) by mouth daily.  30 tablet  11  . vitamin C (ASCORBIC ACID) 500 MG  tablet Take 500 mg by mouth daily.        Marland Kitchen amoxicillin (AMOXIL) 500 MG capsule Take 2 capsules (1,000 mg total) by mouth 2 (two) times daily.  40 capsule  0  . Omega-3 350 MG CAPS Take 1 capsule by mouth daily.         Facility-Administered Medications Prior to Visit  Medication Dose Route Frequency Provider Last Rate Last Dose  . methylPREDNISolone acetate (DEPO-MEDROL) 20 MG/ML injection 20 mg  20 mg Intra-Lesional Once Curtis Garter, MD      . methylPREDNISolone acetate (DEPO-MEDROL) injection 40 mg  40 mg Intra-articular Once Curtis Quint Plotnikov, MD      . methylPREDNISolone acetate (DEPO-MEDROL) injection 40 mg  40 mg Intra-articular Once Curtis Garter, MD          Review of Systems  Constitutional: Negative for fever and unexpected weight change.  HENT: Positive for congestion, postnasal drip and sinus pressure. Negative for dental problem, ear pain, nosebleeds, rhinorrhea, sneezing, sore throat and trouble swallowing.   Eyes: Negative for redness and itching.  Respiratory: Positive for cough, chest tightness and shortness of  breath. Negative for wheezing.   Cardiovascular: Negative for palpitations and leg swelling.  Gastrointestinal: Negative for nausea and vomiting.  Genitourinary: Negative for dysuria.  Musculoskeletal: Negative for joint swelling.  Skin: Negative for rash.  Neurological: Negative for headaches.  Hematological: Does not bruise/bleed easily.  Psychiatric/Behavioral: Negative for dysphoric mood. The patient is not nervous/anxious.        Objective:   Physical Exam Filed Vitals:   07/25/13 1123  BP: 142/86  Pulse: 70  Height: 5\' 10"  (1.778 m)  Weight: 207 lb (93.895 kg)  SpO2: 98%  RA  Gen: well appearing, no acute distress HEENT: NCAT, PERRL, EOMi, OP clear, neck supple without masses PULM: few wheezes upper lobes, otherwise normal air movement and percussion CV: RRR, no mgr, no JVD AB: BS+, soft, nontender, no hsm Ext: warm, no edema, no clubbing, no cyanosis Derm: no rash or skin breakdown Neuro: A&Ox4, CN II-XII intact, strength 5/5 in all 4 extremities   11/14 abn CXR 06/2013 CT chest > cylindrical bronchiectasis, multiple pulmonary nodules, tree in bud abnormalities in R lung, largest in LUL 1.5cm in size      Assessment & Plan:   Pulmonary nodule, left [name]'s CT chest showed a lot of small nodules, tree-in-bud abnormalities and bronchiectasis in addition to this 1.5cm nodule in the LUL.  He has a history of having a benign pulmonary nodule which was biopsied in 2000.  I don't have path for this, but I suspect it was related to his bronchiectasis.  The CT also reveals a lot of mucus plugging.  I hope that this nodule is mucus plugging or a small focal infection related to his bronchiectasis, but given the size and his age we need to keep a close eye on it.  I am going to try to make it go away with more antibiotics and aggressive mucociliary clearance techniques (see below).  Plan: -treat bronchiectasis agressively -add another week of antibiotics -repeat CT  chest in one month  Bronchiectasis without acute exacerbation It sounds like this is post infectious as he had a nasty episode of pneumonia in the 1970's which left him hospitalized for two weeks.  Ever since then he has had a chronic cough and congestion.  This is the cause of his cough.   Plan: -spirometry today -flutter valve tid -instructed on chest PT  technique -sputum culture if able to provide -add Dulera two puffs bid if no improvement in two weeks (sample given)   Updated Medication List Outpatient Encounter Prescriptions as of 07/25/2013  Medication Sig  . Cholecalciferol 1000 UNITS tablet Take 1,000 Units by mouth daily.    . fluticasone (FLONASE) 50 MCG/ACT nasal spray Place 2 sprays into both nostrils daily.  . Multiple Vitamin (MULTIVITAMIN) tablet Take 1 tablet by mouth daily.    . niacin 500 MG CR capsule Take 500 mg by mouth at bedtime.    Curtis Jacobs by Does not apply route daily.    Marland Kitchen terazosin (HYTRIN) 10 MG capsule Take 1 capsule (10 mg total) by mouth at bedtime.  Marland Kitchen amoxicillin-clavulanate (AUGMENTIN) 875-125 MG per tablet Take 1 tablet by mouth 2 (two) times daily.  . Glucosamine-Chondroit-Vit C-Mn (GLUCOSAMINE CHONDROITIN COMPLX) CAPS Take 1 capsule by mouth daily.    . hydrOXYzine (ATARAX/VISTARIL) 25 MG tablet Take 25 mg by mouth daily.  Marland Kitchen losartan (COZAAR) 100 MG tablet Take 1 tablet (100 mg total) by mouth daily.  Marland Kitchen Respiratory Therapy Supplies (FLUTTER) DEVI 1 Device by Does not apply route 3 (three) times daily.  . vitamin C (ASCORBIC ACID) 500 MG tablet Take 500 mg by mouth daily.    . [DISCONTINUED] amoxicillin (AMOXIL) 500 MG capsule Take 2 capsules (1,000 mg total) by mouth 2 (two) times daily.  . [DISCONTINUED] Omega-3 350 MG CAPS Take 1 capsule by mouth daily.

## 2013-07-25 NOTE — Patient Instructions (Addendum)
Use the flutter valve three times a day (ten breaths at a time) Have your wife perform chest physiotherapy as I described in the mornings If no improvement in the next two weeks, then start using the [facility] inhaler 2 puffs twice a day no matter how you feel Take the augmentin twice a day with yogurt or a probiotic I will order a repeat CT chest for one months ago We will see you back after that CT chest in one month

## 2013-07-25 NOTE — Assessment & Plan Note (Signed)
Curtis Jacobs CT chest showed a lot of small nodules, tree-in-bud abnormalities and bronchiectasis in addition to this 1.5cm nodule in the LUL.  He has a history of having a benign pulmonary nodule which was biopsied in 2000.  I don't have path for this, but I suspect it was related to his bronchiectasis.  The CT also reveals a lot of mucus plugging.  I hope that this nodule is mucus plugging or a small focal infection related to his bronchiectasis, but given the size and his age we need to keep a close eye on it.  I am going to try to make it go away with more antibiotics and aggressive mucociliary clearance techniques (see below).  Plan: -treat bronchiectasis agressively -add another week of antibiotics -repeat CT chest in one month

## 2013-08-15 ENCOUNTER — Other Ambulatory Visit: Payer: Self-pay | Admitting: Gastroenterology

## 2013-08-29 ENCOUNTER — Ambulatory Visit (INDEPENDENT_AMBULATORY_CARE_PROVIDER_SITE_OTHER)
Admission: RE | Admit: 2013-08-29 | Discharge: 2013-08-29 | Disposition: A | Discharge status: Home / Self Care | Payer: Medicare Other | Source: Ambulatory Visit | Attending: Pulmonary Disease | Admitting: Pulmonary Disease

## 2013-08-29 ENCOUNTER — Telehealth: Payer: Self-pay

## 2013-08-29 DIAGNOSIS — R911 Solitary pulmonary nodule: Secondary | ICD-10-CM

## 2013-08-29 NOTE — Telephone Encounter (Signed)
Message copied by Len Blalock on Tue Aug 29, 2013  1:22 PM ------      Message from: Simonne Maffucci B      Created: Tue Aug 29, 2013 11:54 AM       A,            Please let him know that his CT looked OK and that the nodule did not grow which is good.  We will discuss more on the next office visit.            Thanks      B ------

## 2013-08-29 NOTE — Telephone Encounter (Signed)
LMTCB X1 

## 2013-08-29 NOTE — Telephone Encounter (Signed)
Spoke with pt, he is aware.  Nothing further needed.

## 2013-08-29 NOTE — Telephone Encounter (Signed)
Message copied by Len Blalock on Tue Aug 29, 2013 12:02 PM ------      Message from: Simonne Maffucci B      Created: Tue Aug 29, 2013 11:54 AM       A,            Please let him know that his CT looked OK and that the nodule did not grow which is good.  We will discuss more on the next office visit.            Thanks      B ------

## 2013-09-04 ENCOUNTER — Encounter: Payer: Self-pay | Admitting: Pulmonary Disease

## 2013-09-04 ENCOUNTER — Ambulatory Visit (INDEPENDENT_AMBULATORY_CARE_PROVIDER_SITE_OTHER): Payer: Medicare Other | Admitting: Pulmonary Disease

## 2013-09-04 ENCOUNTER — Encounter (INDEPENDENT_AMBULATORY_CARE_PROVIDER_SITE_OTHER): Payer: Self-pay

## 2013-09-04 VITALS — BP 146/76 | HR 80 | Ht 70.0 in | Wt 211.0 lb

## 2013-09-04 DIAGNOSIS — J479 Bronchiectasis, uncomplicated: Secondary | ICD-10-CM

## 2013-09-04 DIAGNOSIS — R911 Solitary pulmonary nodule: Secondary | ICD-10-CM

## 2013-09-04 MED ORDER — AEROCHAMBER MV MISC: Status: DC

## 2013-09-04 MED ORDER — MOMETASONE FURO-FORMOTEROL FUM 200-5 MCG/ACT IN AERO: 2.0000 | INHALATION_SPRAY | Freq: Two times a day (BID) | RESPIRATORY_TRACT | Status: DC

## 2013-09-04 NOTE — Assessment & Plan Note (Signed)
Because he has a history of fibrotic nodules (biopsied with open lung biopsy) in years prior and this nodule is not changing, I would like to take a cautious approach by watching with serial exams.  He knows to look out for chest pain, weight loss, hemoptysis. Plan: -repeat CT chest in 4 months

## 2013-09-04 NOTE — Patient Instructions (Signed)
You have bronchiectasis Try to give [place] a sample of mucus Use the dulera 2 puffs twice a day with a spacer Use the flutter valve three times a day We will see you in 4 months after your next CT scan

## 2013-09-04 NOTE — Assessment & Plan Note (Addendum)
I'm pleased that he had a good response to the [facility].  I had to explain bronchiectasis to him and his wife at length today in clinic and I explained that I think this is due to his prior episode of severe pneumonia in the 1970's.   I wonder whether or not he has MAI, which can certainly occur in bronchiectasis.  His CT scan was suggestive of this, but fortunately he doesn't have symptoms.  Plan: -flutter tid -Dulera bid -sputum MAI when he can provide it

## 2013-09-04 NOTE — Progress Notes (Deleted)
Patient ID: Curtis Jacobs, male   DOB: Jan 01, 1933, 80 y.o.   MRN: [id number]

## 2013-09-04 NOTE — Progress Notes (Signed)
Subjective:    Patient ID: Curtis Jacobs, male    DOB: June 30, 1933, 80 y.o.   MRN: [id number]  Synopsis: Mr. Tangen has bronchiectasis (post infectious) and a pulmonary nodule.  He had normal spirometry on the first visit with [facility] in 2014.  His bronchiectasis is likely from a bad pneumonia in the 1970's.    HPI  09/04/2013 ROV one month follow up> He has been taking the [facility] since the last visit and he is concerned that it is causing hypertension and hoarseness.  He felt that his shortness of breath improved when he was on this.  He stopped taking it because he was worried about the side effects and his dyspnea worsened.  Also, he had more mucus production (like he could clear his chest more) and less congestion with this.   He has not had fevers, chills, weight loss, or night sweats.  Past Medical History  Diagnosis Date  . BPH (benign prostatic hypertrophy)     Dr. Risa Grill  . Hyperlipidemia   . HTN (hypertension)   . HA (headache)     Traction- Dr. Jannifer Franklin- had MRI.MRA     Review of Systems  Constitutional: Positive for fatigue. Negative for fever and chills.  HENT: Negative for postnasal drip, rhinorrhea and sinus pressure.   Respiratory: Positive for cough and shortness of breath. Negative for wheezing.   Cardiovascular: Negative for chest pain, palpitations and leg swelling.       Objective:   Physical Exam  Filed Vitals:   09/04/13 1338  BP: 146/76  Pulse: 80  Height: 5\' 10"  (1.778 m)  Weight: 211 lb (95.709 kg)  SpO2: 98%  RA  Gen: well appearing HEENT: NCAT, PERRL, EOMi PULM: CTA B CV: RRR, no mgr AB: BS+ soft, nontender Ext: warm, no edema  08/2013 CT chest > bronchiectasis, nodule unchanged, findings suggestive of MAI     Assessment & Plan:   Pulmonary nodule, left Because he has a history of fibrotic nodules (biopsied with open lung biopsy) in years prior and this nodule is not changing, I would like to take a cautious approach by watching  with serial exams.  He knows to look out for chest pain, weight loss, hemoptysis. Plan: -repeat CT chest in 4 months  Bronchiectasis without acute exacerbation I'm pleased that he had a good response to the [facility].  I had to explain bronchiectasis to him and his wife at length today in clinic and I explained that I think this is due to his prior episode of severe pneumonia in the 1970's.   I wonder whether or not he has MAI, which can certainly occur in bronchiectasis.  His CT scan was suggestive of this, but fortunately he doesn't have symptoms.  Plan: -flutter tid -Dulera bid -sputum MAI when he can provide it    30 minutes during this office visit were spent in direct counseling about his illness  Updated Medication List Outpatient Encounter Prescriptions as of 09/04/2013  Medication Sig  . Cholecalciferol 1000 UNITS tablet Take 2,000 Units by mouth daily.   . fluticasone (FLONASE) 50 MCG/ACT nasal spray Place 2 sprays into both nostrils daily.  . Glucosamine-Chondroit-Vit C-Mn (GLUCOSAMINE CHONDROITIN COMPLX) CAPS Take 1 capsule by mouth daily.    Marland Kitchen loratadine (CLARITIN) 10 MG tablet Take 10 mg by mouth daily.  Marland Kitchen losartan (COZAAR) 100 MG tablet Take 1 tablet (100 mg total) by mouth daily.  . Multiple Vitamin (MULTIVITAMIN) tablet Take 1 tablet by mouth daily.    Marland Kitchen  niacin 500 MG CR capsule Take 500 mg by mouth at bedtime.    Marland Kitchen Respiratory Therapy Supplies (FLUTTER) DEVI 1 Device by Does not apply route 3 (three) times daily.  [name] POWD by Does not apply route daily.    Marland Kitchen terazosin (HYTRIN) 10 MG capsule Take 1 capsule (10 mg total) by mouth at bedtime.  . vitamin C (ASCORBIC ACID) 500 MG tablet Take 500 mg by mouth daily.    . [DISCONTINUED] hydrOXYzine (ATARAX/VISTARIL) 25 MG tablet Take 25 mg by mouth daily.

## 2013-09-07 ENCOUNTER — Telehealth: Payer: Self-pay | Admitting: Pulmonary Disease

## 2013-09-07 NOTE — Telephone Encounter (Signed)
Spoke with pt. He reports dulera is not covered by his insurance. It will cost over $300. I advised will call to see what is covered. I called [phone]. ID# [id number]. I spoke with Ona from [facility] and was advised this is a medicare pt and will need to speak with a Medicare rep. I was then transferred to [employer] [phone]. I called and was on hold x 10 min wcb

## 2013-09-07 NOTE — Telephone Encounter (Signed)
We need to know from his insurance company what options work for him  I'm OK with Breo 1 puff daily, symbicort 80/4.5 2 puffs bid, or Advair 250/50 1 puff bid

## 2013-09-07 NOTE — Telephone Encounter (Signed)
Spoke with pt. He received his formulary in the mail this afternoon. He reports breo, symbicort, advair diskus is all tier 3. He had no tier 2 medications on his formulary.  Please advise which you would like [place] to call in. Dr. Lake Bells thanks

## 2013-09-07 NOTE — Telephone Encounter (Signed)
ATC again and was on hold x 15 min and still never received anyone. Dr. Lake Bells please advise of alternative for pt and we can submit this to the pharmacy to see if it's covered? Thanks  Allergies  Allergen Reactions  . Lisinopril     REACTION: cough

## 2013-09-08 MED ORDER — BUDESONIDE-FORMOTEROL FUMARATE 80-4.5 MCG/ACT IN AERO: 2.0000 | INHALATION_SPRAY | Freq: Two times a day (BID) | RESPIRATORY_TRACT | Status: DC

## 2013-09-08 NOTE — Telephone Encounter (Signed)
Symbicort 80/4.5 2 puffs bid

## 2013-09-08 NOTE — Telephone Encounter (Signed)
I called and spoke with pt and is aware of recs. RX sent

## 2013-10-11 ENCOUNTER — Other Ambulatory Visit: Payer: Self-pay | Admitting: Internal Medicine

## 2013-11-30 ENCOUNTER — Ambulatory Visit: Payer: Self-pay | Admitting: Podiatry

## 2013-12-28 ENCOUNTER — Ambulatory Visit (INDEPENDENT_AMBULATORY_CARE_PROVIDER_SITE_OTHER)
Admission: RE | Admit: 2013-12-28 | Discharge: 2013-12-28 | Disposition: A | Discharge status: Home / Self Care | Payer: Medicare Other | Source: Ambulatory Visit | Attending: Pulmonary Disease | Admitting: Pulmonary Disease

## 2013-12-28 DIAGNOSIS — R911 Solitary pulmonary nodule: Secondary | ICD-10-CM

## 2013-12-29 NOTE — Progress Notes (Signed)
Quick Note:  Spoke with pt, he is aware of results. Per last ov note he was due for a follow-up visit. Scheduled him for 5/28 @10 :30. Nothing further needed at this time. ______

## 2014-01-04 ENCOUNTER — Encounter: Payer: Self-pay | Admitting: Internal Medicine

## 2014-01-04 ENCOUNTER — Ambulatory Visit (INDEPENDENT_AMBULATORY_CARE_PROVIDER_SITE_OTHER): Payer: Medicare Other | Admitting: Internal Medicine

## 2014-01-04 VITALS — BP 150/72 | HR 72 | Temp 97.6°F | Resp 16 | Wt 203.0 lb

## 2014-01-04 DIAGNOSIS — M6530 Trigger finger, unspecified finger: Secondary | ICD-10-CM

## 2014-01-04 DIAGNOSIS — M25519 Pain in unspecified shoulder: Secondary | ICD-10-CM

## 2014-01-04 DIAGNOSIS — L309 Dermatitis, unspecified: Secondary | ICD-10-CM | POA: Insufficient documentation

## 2014-01-04 DIAGNOSIS — L259 Unspecified contact dermatitis, unspecified cause: Secondary | ICD-10-CM

## 2014-01-04 DIAGNOSIS — I10 Essential (primary) hypertension: Secondary | ICD-10-CM

## 2014-01-04 MED ORDER — METHYLPREDNISOLONE ACETATE 80 MG/ML IJ SUSP
80.0000 mg | Freq: Once | INTRAMUSCULAR | Status: AC
  Administered 2014-01-04: 80 mg via INTRAMUSCULAR

## 2014-01-04 NOTE — Assessment & Plan Note (Signed)
F/u w/Derm  

## 2014-01-04 NOTE — Progress Notes (Signed)
Subjective:     HPI   The patient presents for a follow-up of  chronic hypertension, chronic dyslipidemia, BPH controlled with medicines F/u R shoulder pain - not better, clicking F/u rash - he went to [facility]    Review of Systems  Constitutional: Negative for appetite change, fatigue and unexpected weight change.  HENT: Negative for congestion, nosebleeds, sneezing, sore throat and trouble swallowing.   Eyes: Negative for itching and visual disturbance.  Respiratory: Negative for cough.   Cardiovascular: Negative for chest pain, palpitations and leg swelling.  Gastrointestinal: Negative for nausea, diarrhea, blood in stool and abdominal distention.  Genitourinary: Negative for frequency and hematuria.  Musculoskeletal: Negative for back pain, gait problem, joint swelling and neck pain.  Skin: Negative for rash.  Neurological: Negative for dizziness, tremors, speech difficulty and weakness.  Psychiatric/Behavioral: Negative for sleep disturbance, dysphoric mood and agitation. The patient is not nervous/anxious.        Objective:   Physical Exam  Constitutional: He is oriented to person, place, and time. He appears well-developed.  HENT:  Mouth/Throat: Oropharynx is clear and moist.  Eyes: Conjunctivae are normal. Pupils are equal, round, and reactive to light.  Neck: Normal range of motion. No JVD present. No thyromegaly present.  Cardiovascular: Normal rate, regular rhythm, normal heart sounds and intact distal pulses.  Exam reveals no gallop and no friction rub.   No murmur heard. Pulmonary/Chest: Effort normal and breath sounds normal. No respiratory distress. He has no wheezes. He has no rales. He exhibits no tenderness.  Abdominal: Soft. Bowel sounds are normal. He exhibits no distension and no mass. There is no tenderness. There is no rebound and no guarding.  Musculoskeletal: Normal range of motion. He exhibits tenderness (R bicip tendon is painfull. Neck is  stiff and ). He exhibits no edema.  Lymphadenopathy:    He has no cervical adenopathy.  Neurological: He is alert and oriented to person, place, and time. He has normal reflexes. No cranial nerve deficit. He exhibits normal muscle tone. Coordination normal.  Skin: Skin is warm and dry. No rash noted.  Psychiatric: He has a normal mood and affect. His behavior is normal. Judgment and thought content normal.  L 4th finger is triggering  Lab Results  Component Value Date   WBC 5.3 06/14/2012   HGB 14.4 06/14/2012   HCT 44.1 06/14/2012   PLT 172.0 06/14/2012   CHOL 219* 06/14/2012   TRIG 162.0* 06/14/2012   HDL 35.50* 06/14/2012   LDLDIRECT 146.0 06/14/2012   ALT 19 06/14/2012   AST 19 06/14/2012   NA 140 06/14/2012   K 4.3 06/14/2012   CL 108 06/14/2012   CREATININE 1.1 06/14/2012   BUN 15 06/14/2012   CO2 27 06/14/2012   TSH 2.30 06/14/2012   PSA 4.75* 06/14/2012    Procedure Note :    Trigger finger tendon Injection:   Indication : Trigger finger right 4th   Risks including unsuccessful procedure , bleeding, infection, bruising, skin atrophy and others were explained to the patient in detail as well as the benefits. Informed consent was obtained and signed.   Tthe patient was placed in a comfortable position.    Flexor digitorum tendon was marked and  the skin was prepped with Betadine and alcohol. 1 inch 25-gauge needle was used. The needle was advanced  into the skin down to tendon. It  was injected with 0.5 mL of 2% lidocaine and 10 mg of Depo-Medrol in a usual fashion.  Band-Aids applied.   Tolerated well. Complications: None. Good pain relief following the procedure.       Assessment & Plan:

## 2014-01-04 NOTE — Assessment & Plan Note (Signed)
Not beter

## 2014-01-04 NOTE — Assessment & Plan Note (Signed)
5/15 L 4th Will inject

## 2014-01-04 NOTE — Assessment & Plan Note (Signed)
Better  

## 2014-01-04 NOTE — Progress Notes (Signed)
Pre visit review using our clinic review tool, if applicable. No additional management support is needed unless otherwise documented below in the visit note. 

## 2014-01-11 ENCOUNTER — Encounter: Payer: Self-pay | Admitting: Pulmonary Disease

## 2014-01-11 ENCOUNTER — Ambulatory Visit (INDEPENDENT_AMBULATORY_CARE_PROVIDER_SITE_OTHER): Payer: Medicare Other | Admitting: Pulmonary Disease

## 2014-01-11 VITALS — BP 128/76 | HR 76 | Ht 70.0 in | Wt 205.0 lb

## 2014-01-11 DIAGNOSIS — R911 Solitary pulmonary nodule: Secondary | ICD-10-CM

## 2014-01-11 DIAGNOSIS — J479 Bronchiectasis, uncomplicated: Secondary | ICD-10-CM

## 2014-01-11 DIAGNOSIS — J329 Chronic sinusitis, unspecified: Secondary | ICD-10-CM

## 2014-01-11 NOTE — Assessment & Plan Note (Signed)
The nodule has her main unchanged since November 2014. I believe that this is a benign process somewhat to the ones that he had biopsied back in 2000.  Plan: -Final CT chest November 2016, advised patient of red flag symptoms (weight loss, hemoptysis, shortness of breath) and to let us know if he develops any these problems.

## 2014-01-11 NOTE — Assessment & Plan Note (Signed)
It sounds like he has allergic rhinitis.  Plan: -Nasacort 2 puffs daily -Generic Zyrtec -Saline rinses twice a day as needed

## 2014-01-11 NOTE — Progress Notes (Signed)
Subjective:    Patient ID: Curtis Jacobs, male    DOB: 1933/06/17, 80 y.o.   MRN: [id number]  Synopsis: Curtis Jacobs has bronchiectasis (post infectious) and a pulmonary nodule.  He had normal spirometry on the first visit with [facility] in 2014.  His bronchiectasis is likely from a bad pneumonia in the 1970's.    HPI   09/04/2013 ROV one month follow up> He has been taking the [facility] since the last visit and he is concerned that it is causing hypertension and hoarseness.  He felt that his shortness of breath improved when he was on this.  He stopped taking it because he was worried about the side effects and his dyspnea worsened.  Also, he had more mucus production (like he could clear his chest more) and less congestion with this.   He has not had fevers, chills, weight loss, or night sweats.  Jan 11 2014 routine office visit> Curtis Jacobs has been doing fairly well since the last visit. He continues to have some shortness of breath on exertion. He is not exercising regularly. He is producing mucus in the mornings after using his flutter valve. This has not increased since the last visit. He has not had any respiratory infections since the last visit. He doesn't think that the Symbicort is adding much. He has been taking it regularly. He is also been noticing trouble breathing through his nose. He uses Afrin every once in a while and it helps. He is not using any sort of allergy therapy. The congestion has been somewhat constant since the last visit.  Past Medical History  Diagnosis Date  . BPH (benign prostatic hypertrophy)     Dr. Risa Grill  . Hyperlipidemia   . HTN (hypertension)   . HA (headache)     Traction- Dr. Jannifer Franklin- had MRI.MRA     Review of Systems  Constitutional: Positive for fatigue. Negative for fever and chills.  HENT: Negative for postnasal drip, rhinorrhea and sinus pressure.   Respiratory: Positive for cough and shortness of breath. Negative for wheezing.    Cardiovascular: Negative for chest pain, palpitations and leg swelling.       Objective:   Physical Exam   Filed Vitals:   01/11/14 1039  BP: 128/76  Pulse: 76  Height: 5\' 10"  (1.778 m)  Weight: 205 lb (92.987 kg)  SpO2: 96%  RA  Gen: well appearing HEENT: NCAT, PERRL, EOMi PULM: few crackles right lung base CV: RRR, no mgr AB: BS+ soft, nontender Ext: warm, no edema  08/2013 CT chest > bronchiectasis, nodule unchanged, findings suggestive of MAI 12/2013 CT CHest > unchanged bronchiectasis and nodule (1.4cm)     Assessment & Plan:   Pulmonary nodule, left The nodule has her main unchanged since November 2014. I believe that this is a benign process somewhat to the ones that he had biopsied back in 2000.  Plan: -Final CT chest November 2016, advised patient of red flag symptoms (weight loss, hemoptysis, shortness of breath) and to let us know if he develops any these problems.  Bronchiectasis without acute exacerbation Stable interval. It's not clear that the Symbicort is adding anything.  Plan: -Continue mucociliary clearance with flutter valve 3 times a day -Trial off of Symbicort for 2 weeks, if increased dyspnea and mucus production then resume Symbicort -exercise regularly -Followup 6 months  SINUSITIS It sounds like he has allergic rhinitis.  Plan: -Nasacort 2 puffs daily -Generic Zyrtec -Saline rinses twice a day as needed  Again today (01/11/2014) >30 minutes during this office visit were spent in direct counseling he and his wife about his illness  Updated Medication List Outpatient Encounter Prescriptions as of 01/11/2014  Medication Sig  . budesonide-formoterol (SYMBICORT) 80-4.5 MCG/ACT inhaler Inhale 2 puffs into the lungs 2 (two) times daily.  . Cholecalciferol 1000 UNITS tablet Take 2,000 Units by mouth daily.   . fluticasone (FLONASE) 50 MCG/ACT nasal spray Place 2 sprays into both nostrils daily.  . Glucosamine-Chondroit-Vit C-Mn  (GLUCOSAMINE CHONDROITIN COMPLX) CAPS Take 1 capsule by mouth daily.    Marland Kitchen loratadine (CLARITIN) 10 MG tablet Take 10 mg by mouth daily.  Marland Kitchen losartan (COZAAR) 100 MG tablet Take 1 tablet (100 mg total) by mouth daily.  . Multiple Vitamin (MULTIVITAMIN) tablet Take 1 tablet by mouth daily.    . niacin 500 MG CR capsule Take 500 mg by mouth at bedtime.    Marland Kitchen Respiratory Therapy Supplies (FLUTTER) DEVI 1 Device by Does not apply route 3 (three) times daily.  [name] POWD by Does not apply route daily.    Marland Kitchen Spacer/Aero-Holding Chambers (AEROCHAMBER MV) inhaler Use as instructed  . terazosin (HYTRIN) 10 MG capsule TAKE 1 CAPSULE BY MOUTH AT BEDTIME  . vitamin C (ASCORBIC ACID) 500 MG tablet Take 500 mg by mouth daily.    . [DISCONTINUED] mometasone-formoterol (DULERA) 200-5 MCG/ACT AERO Inhale 2 puffs into the lungs 2 (two) times daily.

## 2014-01-11 NOTE — Patient Instructions (Signed)
You have bronchiectasis  Your pulmonary nodule is stable.  We will order a CT scan for November 2016.  If you cough up blood, develop worsening shortness of breath or weight loss before then let me know  Stop taking the symbicort for two weeks, if you have worsened shortness of breath or cough with mucus start using the medicine again  For the sinuses, use nasacort 2 puffs each nostril daily, use Milta Deiters Med rinses twice a day, also use zyrtec (generic OK)  We will see you back in 6 months or sooner if needed

## 2014-01-11 NOTE — Assessment & Plan Note (Addendum)
Stable interval. It's not clear that the Symbicort is adding anything.  Plan: -Continue mucociliary clearance with flutter valve 3 times a day -Trial off of Symbicort for 2 weeks, if increased dyspnea and mucus production then resume Symbicort -exercise regularly -Followup 6 months

## 2014-04-11 ENCOUNTER — Other Ambulatory Visit: Payer: Self-pay | Admitting: Internal Medicine

## 2014-05-24 ENCOUNTER — Telehealth: Payer: Self-pay | Admitting: Pulmonary Disease

## 2014-05-24 NOTE — Telephone Encounter (Signed)
LMTCB-we can schedule patient for follow up with BQ in November and then send message to [facility] to get CT scheduled and call the patient. We will need to let [facility] know date of appt with BQ. Thanks.

## 2014-05-25 NOTE — Telephone Encounter (Signed)
Dr. Lake Bells did you want to see this patient in November 2015 or November 2016?  His last ov note from May 2015 says 2016, but the pt and wife are scheduled for November of this year.

## 2014-05-25 NOTE — Telephone Encounter (Signed)
The avs says 06-2015 Joellen Jersey

## 2014-05-25 NOTE — Telephone Encounter (Signed)
Order already in [place] for November CT scan.  Pt aware that we will be scheduling this.  [facility] please advise on CT scheduling.  Thank you.

## 2014-05-25 NOTE — Telephone Encounter (Signed)
[name] returned call.  Scheduled pt for an appt w/ BQ on 06/18/14 at 2:15 PM.  Pt needs to have a CT scheduled for this appointment.  [name] can be reached at [phone].  Curtis Jacobs

## 2014-05-27 NOTE — Telephone Encounter (Signed)
Per my last note and instructions in May 2015: He will see me in November 2015 for his bronchiectasis; He does not need another CT until November 2016.    Please do not order a CT for November 2015, he doesn't need it.  But he does need to see me for a follow up in November 2015.

## 2014-05-28 NOTE — Telephone Encounter (Signed)
There is no reason to order it sooner unless he has had symptoms as outlined in the note.  If he has those symptoms then he needs to come in and discuss it.

## 2014-05-28 NOTE — Telephone Encounter (Signed)
Spoke with patient. Pt states that he is very anxious and uneasy about not repeating CT scan this year. Pt refused to schedule f/u in Nov until we heard back from Dr Lake Bells on if he could have CT repeated. I reviewed last OV notes with the patient regarding BQ findings and rec's after last CT Pulmonary nodule, left - Curtis Doom, MD at 01/11/2014 11:27 AM     Status: Written Related Problem: Pulmonary nodule, left    The nodule has her main unchanged since November 2014. I believe that this is a benign process somewhat to the ones that he had biopsied back in 2000.  Plan:  -Final CT chest November 2016, advised patient of red flag symptoms (weight loss, hemoptysis, shortness of breath) and to let us know if he develops any these problems.  Pt was very adamant that he wanted this repeated and did not agree with recommendations of waiting until 2016.   Please advise Dr Sherrin Daisy. Thanks.

## 2014-05-28 NOTE — Telephone Encounter (Signed)
lmomtcb x1 

## 2014-05-30 NOTE — Telephone Encounter (Signed)
Spoke with pt and advised of Dr Anastasia Pall recommendations.  Pt verbalized understanding.  Pt will keep f/u appt with BQ on 06/19/14.

## 2014-06-18 ENCOUNTER — Ambulatory Visit: Payer: Medicare Other | Admitting: Pulmonary Disease

## 2014-06-19 ENCOUNTER — Ambulatory Visit (INDEPENDENT_AMBULATORY_CARE_PROVIDER_SITE_OTHER): Payer: Medicare Other | Admitting: Pulmonary Disease

## 2014-06-19 ENCOUNTER — Encounter (INDEPENDENT_AMBULATORY_CARE_PROVIDER_SITE_OTHER): Payer: Self-pay

## 2014-06-19 ENCOUNTER — Encounter: Payer: Self-pay | Admitting: Pulmonary Disease

## 2014-06-19 VITALS — BP 126/80 | HR 65 | Temp 98.0°F | Ht 70.0 in | Wt 210.0 lb

## 2014-06-19 DIAGNOSIS — J479 Bronchiectasis, uncomplicated: Secondary | ICD-10-CM

## 2014-06-19 DIAGNOSIS — R911 Solitary pulmonary nodule: Secondary | ICD-10-CM

## 2014-06-19 NOTE — Patient Instructions (Signed)
Follow-up in 1 year Get Flu Shot next Fall We will repeat CT scan in 1 year, November 2016

## 2014-06-19 NOTE — Progress Notes (Signed)
Subjective:    Patient ID: Curtis Jacobs, male    DOB: 09-Dec-1932, 80 y.o.   MRN: [id number]  Synopsis: Mr. Nazaire has bronchiectasis (post infectious) and a pulmonary nodule.  He had normal spirometry on the first visit with  pulmonary in 2014.  His bronchiectasis is likely from a bad pneumonia in the 1970's.    HPI  Chief Complaint  Patient presents with  . Follow-up    Pt states that he has a dry coughing spell each morning, but this is not any worse then before. He denies any increase in SOB at this time.  Pt has quesitons about flutter valve and aerochamber.    06/19/2014 routine office visit> Curtis Jacobs has been doing fine since the last visit. He continues to have a cough in the mornings which is occasionally productive of scant amounts of mucus. He does not produce much mucus during the daytime. He has been using his flutter valve but he says that it doesn't make a difference with his cough. He currently does not take any Mucinex. His weight has been stable since the last visit and he has not had hemoptysis or chest pain. He continues to remain active without significant shortness of breath. He has not taken the Symbicort since the last visit and he says he can't really tell a difference being off of it now.  Past Medical History  Diagnosis Date  . BPH (benign prostatic hypertrophy)     Dr. Risa Grill  . Hyperlipidemia   . HTN (hypertension)   . HA (headache)     Traction- Dr. Jannifer Franklin- had MRI.MRA     Review of Systems  Constitutional: Negative for fever, chills and fatigue.  HENT: Negative for postnasal drip, rhinorrhea and sinus pressure.   Respiratory: Positive for cough. Negative for shortness of breath and wheezing.   Cardiovascular: Negative for chest pain, palpitations and leg swelling.       Objective:   Physical Exam   Filed Vitals:   06/19/14 1423  BP: 126/80  Pulse: 65  Temp: 98 F (36.7 C)  TempSrc: Oral  Height: 5\' 10"  (1.778 m)  Weight: 210 lb (95.255  kg)  SpO2: 96%  RA  Gen: well appearing HEENT: NCAT, PERRL, EOMi PULM: CTA B today CV: RRR, no mgr AB: BS+ soft, nontender Ext: warm, no edema  08/2013 CT chest > bronchiectasis, nodule unchanged, findings suggestive of MAI 12/2013 CT CHest > unchanged bronchiectasis and nodule (1.4cm)     Assessment & Plan:   Bronchiectasis without acute exacerbation This has been a stable interval for Curtis Jacobs. He has not had an exacerbation of his bronchiectasis since the last visit. He did not receive benefit from Symbicort so I told him to stop it.  Plan: -Use guaifenesin and manual chest PT on days when the chest congestion has increased  -he can continue to try to use the flutter valve but it doesn't sound like he's gotten much benefit from it.  Pulmonary nodule, left His nodule has remained unchanged on the follow-up study in May 2015. Further, he has had nodules biopsied in the past so I presume that this one is benign as well.  He currently has no red flag symptoms of malignancy and he was educated on those again today.  Plan: -Final follow-up CT image scheduled for November 2016    Updated Medication List Outpatient Encounter Prescriptions as of 06/19/2014  Medication Sig  . budesonide-formoterol (SYMBICORT) 80-4.5 MCG/ACT inhaler Inhale 2 puffs into the lungs 2 (  two) times daily.  . Cholecalciferol 1000 UNITS tablet Take 2,000 Units by mouth daily.   . fluticasone (FLONASE) 50 MCG/ACT nasal spray Place 2 sprays into both nostrils daily.  . Glucosamine-Chondroit-Vit C-Mn (GLUCOSAMINE CHONDROITIN COMPLX) CAPS Take 1 capsule by mouth daily.    Marland Kitchen loratadine (CLARITIN) 10 MG tablet Take 10 mg by mouth daily.  Marland Kitchen losartan (COZAAR) 100 MG tablet Take 1 tablet (100 mg total) by mouth daily.  . Multiple Vitamin (MULTIVITAMIN) tablet Take 1 tablet by mouth daily.    . niacin 500 MG CR capsule Take 500 mg by mouth at bedtime.    Marland Kitchen Respiratory Therapy Supplies (FLUTTER) DEVI 1 Device by Does not  apply route 3 (three) times daily.  [name] POWD by Does not apply route daily.    Marland Kitchen Spacer/Aero-Holding Chambers (AEROCHAMBER MV) inhaler Use as instructed  . terazosin (HYTRIN) 10 MG capsule TAKE ONE CAPSULE BY MOUTH AT BEDTIME.  . vitamin C (ASCORBIC ACID) 500 MG tablet Take 500 mg by mouth daily.

## 2014-06-19 NOTE — Assessment & Plan Note (Signed)
This has been a stable interval for Curtis Jacobs. He has not had an exacerbation of his bronchiectasis since the last visit. He did not receive benefit from Symbicort so I told him to stop it.  Plan: -Use guaifenesin and manual chest PT on days when the chest congestion has increased  -he can continue to try to use the flutter valve but it doesn't sound like he's gotten much benefit from it.

## 2014-06-19 NOTE — Assessment & Plan Note (Signed)
His nodule has remained unchanged on the follow-up study in May 2015. Further, he has had nodules biopsied in the past so I presume that this one is benign as well.  He currently has no red flag symptoms of malignancy and he was educated on those again today.  Plan: -Final follow-up CT image scheduled for November 2016

## 2014-07-10 ENCOUNTER — Encounter: Payer: Self-pay | Admitting: Internal Medicine

## 2014-07-10 ENCOUNTER — Other Ambulatory Visit (INDEPENDENT_AMBULATORY_CARE_PROVIDER_SITE_OTHER): Payer: Medicare Other

## 2014-07-10 ENCOUNTER — Ambulatory Visit (INDEPENDENT_AMBULATORY_CARE_PROVIDER_SITE_OTHER): Payer: Medicare Other | Admitting: Internal Medicine

## 2014-07-10 VITALS — BP 140/94 | HR 79 | Temp 98.2°F | Wt 206.0 lb

## 2014-07-10 DIAGNOSIS — L309 Dermatitis, unspecified: Secondary | ICD-10-CM

## 2014-07-10 DIAGNOSIS — E785 Hyperlipidemia, unspecified: Secondary | ICD-10-CM

## 2014-07-10 DIAGNOSIS — N320 Bladder-neck obstruction: Secondary | ICD-10-CM

## 2014-07-10 DIAGNOSIS — J479 Bronchiectasis, uncomplicated: Secondary | ICD-10-CM

## 2014-07-10 LAB — CBC WITH DIFFERENTIAL/PLATELET
BASOS PCT: 0.7 % (ref 0.0–3.0)
Basophils Absolute: 0 10*3/uL (ref 0.0–0.1)
EOS PCT: 2.2 % (ref 0.0–5.0)
Eosinophils Absolute: 0.1 10*3/uL (ref 0.0–0.7)
HEMATOCRIT: 43 % (ref 39.0–52.0)
HEMOGLOBIN: 14.2 g/dL (ref 13.0–17.0)
LYMPHS ABS: 1.5 10*3/uL (ref 0.7–4.0)
Lymphocytes Relative: 28.6 % (ref 12.0–46.0)
MCHC: 33.1 g/dL (ref 30.0–36.0)
MCV: 91 fl (ref 78.0–100.0)
MONOS PCT: 6.8 % (ref 3.0–12.0)
Monocytes Absolute: 0.3 10*3/uL (ref 0.1–1.0)
Neutro Abs: 3.1 10*3/uL (ref 1.4–7.7)
Neutrophils Relative %: 61.7 % (ref 43.0–77.0)
Platelets: 186 10*3/uL (ref 150.0–400.0)
RBC: 4.72 Mil/uL (ref 4.22–5.81)
RDW: 13.5 % (ref 11.5–15.5)
WBC: 5.1 10*3/uL (ref 4.0–10.5)

## 2014-07-10 LAB — URINALYSIS
Bilirubin Urine: NEGATIVE
Hgb urine dipstick: NEGATIVE
Ketones, ur: NEGATIVE
Leukocytes, UA: NEGATIVE
Nitrite: NEGATIVE
PH: 5.5 (ref 5.0–8.0)
SPECIFIC GRAVITY, URINE: 1.025 (ref 1.000–1.030)
TOTAL PROTEIN, URINE-UPE24: NEGATIVE
URINE GLUCOSE: NEGATIVE
Urobilinogen, UA: 0.2 (ref 0.0–1.0)

## 2014-07-10 LAB — PSA: PSA: 3.6 ng/mL (ref 0.10–4.00)

## 2014-07-10 LAB — BASIC METABOLIC PANEL
BUN: 14 mg/dL (ref 6–23)
CO2: 28 mEq/L (ref 19–32)
Calcium: 9.4 mg/dL (ref 8.4–10.5)
Chloride: 105 mEq/L (ref 96–112)
Creatinine, Ser: 1.2 mg/dL (ref 0.4–1.5)
GFR: 63.91 mL/min (ref >60.00)
GLUCOSE: 105 mg/dL — AB (ref 70–99)
POTASSIUM: 4.3 meq/L (ref 3.5–5.1)
SODIUM: 142 meq/L (ref 135–145)

## 2014-07-10 LAB — HEPATIC FUNCTION PANEL
ALBUMIN: 3.9 g/dL (ref 3.5–5.2)
ALT: 16 U/L (ref 0–53)
AST: 18 U/L (ref 0–37)
Alkaline Phosphatase: 101 U/L (ref 39–117)
Bilirubin, Direct: 0.1 mg/dL (ref 0.0–0.3)
TOTAL PROTEIN: 6.5 g/dL (ref 6.0–8.3)
Total Bilirubin: 0.6 mg/dL (ref 0.2–1.2)

## 2014-07-10 LAB — LIPID PANEL
CHOL/HDL RATIO: 7
Cholesterol: 225 mg/dL — ABNORMAL HIGH (ref 0–200)
HDL: 31.6 mg/dL — AB (ref >39.00)
NonHDL: 193.4
Triglycerides: 233 mg/dL — ABNORMAL HIGH (ref 0.0–149.0)
VLDL: 46.6 mg/dL — ABNORMAL HIGH (ref 0.0–40.0)

## 2014-07-10 LAB — TSH: TSH: 2.85 u[IU]/mL (ref 0.35–4.50)

## 2014-07-10 NOTE — Assessment & Plan Note (Signed)
Continue with prn prescription therapy as reflected on the Med list.  Dr Ronnald Ramp

## 2014-07-10 NOTE — Assessment & Plan Note (Signed)
Low fat diet

## 2014-07-10 NOTE — Progress Notes (Signed)
Pre visit review using our clinic review tool, if applicable. No additional management support is needed unless otherwise documented below in the visit note. 

## 2014-07-10 NOTE — Progress Notes (Signed)
   Subjective:     HPI   The patient presents for a follow-up of  chronic hypertension, chronic dyslipidemia, BPH controlled with medicines F/u R shoulder pain - not better, clicking F/u rash - he went to [facility]  Curtis Jacobs has bronchiectasis (post infectious) and a pulmonary nodule. He had normal spirometry on the first visit with [facility] in 2014. His bronchiectasis is likely from a bad pneumonia in the 1970's.   Review of Systems  Constitutional: Negative for appetite change, fatigue and unexpected weight change.  HENT: Negative for congestion, nosebleeds, sneezing, sore throat and trouble swallowing.   Eyes: Negative for itching and visual disturbance.  Respiratory: Negative for cough.   Cardiovascular: Negative for chest pain, palpitations and leg swelling.  Gastrointestinal: Negative for nausea, diarrhea, blood in stool and abdominal distention.  Genitourinary: Negative for frequency and hematuria.  Musculoskeletal: Negative for back pain, joint swelling, gait problem and neck pain.  Skin: Negative for rash.  Neurological: Negative for dizziness, tremors, speech difficulty and weakness.  Psychiatric/Behavioral: Negative for sleep disturbance, dysphoric mood and agitation. The patient is not nervous/anxious.        Objective:   Physical Exam  Constitutional: He is oriented to person, place, and time. He appears well-developed. No distress.  NAD  HENT:  Mouth/Throat: Oropharynx is clear and moist.  Eyes: Conjunctivae are normal. Pupils are equal, round, and reactive to light.  Neck: Normal range of motion. No JVD present. No thyromegaly present.  Cardiovascular: Normal rate, regular rhythm, normal heart sounds and intact distal pulses.  Exam reveals no gallop and no friction rub.   No murmur heard. Pulmonary/Chest: Effort normal and breath sounds normal. No respiratory distress. He has no wheezes. He has no rales. He exhibits no tenderness.  Abdominal:  Soft. Bowel sounds are normal. He exhibits no distension and no mass. There is no tenderness. There is no rebound and no guarding.  Musculoskeletal: Normal range of motion. He exhibits no edema or tenderness.  Lymphadenopathy:    He has no cervical adenopathy.  Neurological: He is alert and oriented to person, place, and time. He has normal reflexes. No cranial nerve deficit. He exhibits normal muscle tone. He displays a negative Romberg sign. Coordination and gait normal.  No meningeal signs  Skin: Skin is warm and dry. No rash noted.  Psychiatric: He has a normal mood and affect. His behavior is normal. Judgment and thought content normal.  forearms w/mild erythema  Lab Results  Component Value Date   WBC 5.3 06/14/2012   HGB 14.4 06/14/2012   HCT 44.1 06/14/2012   PLT 172.0 06/14/2012   CHOL 219* 06/14/2012   TRIG 162.0* 06/14/2012   HDL 35.50* 06/14/2012   LDLDIRECT 146.0 06/14/2012   ALT 19 06/14/2012   AST 19 06/14/2012   NA 140 06/14/2012   K 4.3 06/14/2012   CL 108 06/14/2012   CREATININE 1.1 06/14/2012   BUN 15 06/14/2012   CO2 27 06/14/2012   TSH 2.30 06/14/2012   PSA 4.75* 06/14/2012         Assessment & Plan:

## 2014-07-10 NOTE — Assessment & Plan Note (Signed)
Curtis Jacobs has bronchiectasis (post infectious) and a pulmonary nodule. He had normal spirometry on the first visit with [place] [facility] in 2014. His bronchiectasis is likely from a bad pneumonia in the 1970's.

## 2014-07-11 LAB — LDL CHOLESTEROL, DIRECT: LDL DIRECT: 143.1 mg/dL

## 2014-10-09 ENCOUNTER — Other Ambulatory Visit: Payer: Self-pay | Admitting: Internal Medicine

## 2014-10-23 ENCOUNTER — Other Ambulatory Visit: Payer: Self-pay | Admitting: Dermatology

## 2014-10-23 DIAGNOSIS — L853 Xerosis cutis: Secondary | ICD-10-CM | POA: Diagnosis not present

## 2014-10-23 DIAGNOSIS — L738 Other specified follicular disorders: Secondary | ICD-10-CM | POA: Diagnosis not present

## 2014-10-23 DIAGNOSIS — L080 Pyoderma: Secondary | ICD-10-CM | POA: Diagnosis not present

## 2014-10-23 DIAGNOSIS — D485 Neoplasm of uncertain behavior of skin: Secondary | ICD-10-CM | POA: Diagnosis not present

## 2014-10-23 DIAGNOSIS — Z85828 Personal history of other malignant neoplasm of skin: Secondary | ICD-10-CM | POA: Diagnosis not present

## 2014-10-23 DIAGNOSIS — L821 Other seborrheic keratosis: Secondary | ICD-10-CM | POA: Diagnosis not present

## 2014-10-23 DIAGNOSIS — L918 Other hypertrophic disorders of the skin: Secondary | ICD-10-CM | POA: Diagnosis not present

## 2014-11-29 ENCOUNTER — Other Ambulatory Visit: Payer: Self-pay | Admitting: Internal Medicine

## 2014-11-30 DIAGNOSIS — H2513 Age-related nuclear cataract, bilateral: Secondary | ICD-10-CM | POA: Diagnosis not present

## 2015-01-10 ENCOUNTER — Other Ambulatory Visit (INDEPENDENT_AMBULATORY_CARE_PROVIDER_SITE_OTHER): Payer: Medicare Other

## 2015-01-10 ENCOUNTER — Ambulatory Visit (INDEPENDENT_AMBULATORY_CARE_PROVIDER_SITE_OTHER): Payer: Medicare Other | Admitting: Internal Medicine

## 2015-01-10 ENCOUNTER — Encounter: Payer: Self-pay | Admitting: Internal Medicine

## 2015-01-10 VITALS — BP 148/76 | HR 72 | Ht 70.0 in | Wt 205.0 lb

## 2015-01-10 DIAGNOSIS — E785 Hyperlipidemia, unspecified: Secondary | ICD-10-CM

## 2015-01-10 DIAGNOSIS — Z0000 Encounter for general adult medical examination without abnormal findings: Secondary | ICD-10-CM

## 2015-01-10 DIAGNOSIS — N320 Bladder-neck obstruction: Secondary | ICD-10-CM

## 2015-01-10 LAB — CBC WITH DIFFERENTIAL/PLATELET
BASOS PCT: 0.9 % (ref 0.0–3.0)
Basophils Absolute: 0 10*3/uL (ref 0.0–0.1)
EOS PCT: 2.5 % (ref 0.0–5.0)
Eosinophils Absolute: 0.1 10*3/uL (ref 0.0–0.7)
HEMATOCRIT: 41.5 % (ref 39.0–52.0)
Hemoglobin: 14.1 g/dL (ref 13.0–17.0)
Lymphocytes Relative: 31.5 % (ref 12.0–46.0)
Lymphs Abs: 1.6 10*3/uL (ref 0.7–4.0)
MCHC: 33.9 g/dL (ref 30.0–36.0)
MCV: 90.5 fl (ref 78.0–100.0)
MONO ABS: 0.4 10*3/uL (ref 0.1–1.0)
Monocytes Relative: 8.6 % (ref 3.0–12.0)
NEUTROS PCT: 56.5 % (ref 43.0–77.0)
Neutro Abs: 2.8 10*3/uL (ref 1.4–7.7)
PLATELETS: 160 10*3/uL (ref 150.0–400.0)
RBC: 4.58 Mil/uL (ref 4.22–5.81)
RDW: 13.5 % (ref 11.5–15.5)
WBC: 5 10*3/uL (ref 4.0–10.5)

## 2015-01-10 LAB — URINALYSIS
Bilirubin Urine: NEGATIVE
HGB URINE DIPSTICK: NEGATIVE
KETONES UR: NEGATIVE
Leukocytes, UA: NEGATIVE
Nitrite: NEGATIVE
PH: 6 (ref 5.0–8.0)
SPECIFIC GRAVITY, URINE: 1.025 (ref 1.000–1.030)
TOTAL PROTEIN, URINE-UPE24: NEGATIVE
Urine Glucose: NEGATIVE
Urobilinogen, UA: 0.2 (ref 0.0–1.0)

## 2015-01-10 LAB — HEPATIC FUNCTION PANEL
ALK PHOS: 122 U/L — AB (ref 39–117)
ALT: 16 U/L (ref 0–53)
AST: 16 U/L (ref 0–37)
Albumin: 3.9 g/dL (ref 3.5–5.2)
BILIRUBIN DIRECT: 0.1 mg/dL (ref 0.0–0.3)
BILIRUBIN TOTAL: 0.5 mg/dL (ref 0.2–1.2)
TOTAL PROTEIN: 6.5 g/dL (ref 6.0–8.3)

## 2015-01-10 LAB — BASIC METABOLIC PANEL
BUN: 14 mg/dL (ref 6–23)
CALCIUM: 9.1 mg/dL (ref 8.4–10.5)
CHLORIDE: 106 meq/L (ref 96–112)
CO2: 30 mEq/L (ref 19–32)
Creatinine, Ser: 1.19 mg/dL (ref 0.40–1.50)
GFR: 62.59 mL/min (ref >60.00)
Glucose, Bld: 116 mg/dL — ABNORMAL HIGH (ref 70–99)
Potassium: 4 mEq/L (ref 3.5–5.1)
Sodium: 139 mEq/L (ref 135–145)

## 2015-01-10 LAB — TSH: TSH: 1.25 u[IU]/mL (ref 0.35–4.50)

## 2015-01-10 LAB — LIPID PANEL
Cholesterol: 206 mg/dL — ABNORMAL HIGH (ref 0–200)
HDL: 32.6 mg/dL — ABNORMAL LOW (ref >39.00)
NonHDL: 173.4
TRIGLYCERIDES: 244 mg/dL — AB (ref 0.0–149.0)
Total CHOL/HDL Ratio: 6
VLDL: 48.8 mg/dL — ABNORMAL HIGH (ref 0.0–40.0)

## 2015-01-10 LAB — LDL CHOLESTEROL, DIRECT: LDL DIRECT: 101 mg/dL

## 2015-01-10 LAB — PSA: PSA: 4.85 ng/mL — AB (ref 0.10–4.00)

## 2015-01-10 NOTE — Assessment & Plan Note (Signed)
Here for medicare wellness/physical  Diet: heart healthy  Physical activity: not sedentary - competes in rifle shooting Depression/mood screen: negative  Hearing: intact to whispered voice  Visual acuity: grossly normal, performs annual eye exam  ADLs: capable  Fall risk: none  Home safety: good  Cognitive evaluation: intact to orientation, naming, recall and repetition  EOL planning: adv directives, full code/ I agree  I have personally reviewed and have noted  1. The patient's medical, surgical and social history  2. Their use of alcohol, tobacco or illicit drugs  3. Their current medications and supplements  4. The patient's functional ability including ADL's, fall risks, home safety risks and hearing or visual impairment.  5. Diet and physical activities  6. Evidence for depression or mood disorders 7. The roster of all physicians providing medical care to patient - is listed in the Snapshot section of the chart and reviewed today.    Today patient counseled on age appropriate routine health concerns for screening and prevention, each reviewed and up to date or declined. Immunizations reviewed and up to date or declined. Labs ordered and reviewed. Risk factors for depression reviewed and negative. Hearing function and visual acuity are intact. ADLs screened and addressed as needed. Functional ability and level of safety reviewed and appropriate. Education, counseling and referrals performed based on assessed risks today. Patient provided with a copy of personalized plan for preventive services.

## 2015-01-10 NOTE — Progress Notes (Signed)
Subjective:     HPI  The patient is here for a wellness exam. The patient has been doing well overall without major physical or psychological issues going on lately.  The patient presents for a follow-up of  chronic hypertension, chronic dyslipidemia, BPH controlled with medicines F/u R shoulder pain - not better, clicking F/u rash - he went to [facility]  Curtis Jacobs has bronchiectasis (post infectious) and a pulmonary nodule. He had normal spirometry on the first visit with [place] [facility] in 2014. His bronchiectasis is likely from a bad pneumonia in the 1970's.  Wt Readings from Last 3 Encounters:  01/10/15 205 lb (92.987 kg)  07/10/14 206 lb (93.441 kg)  06/19/14 210 lb (95.255 kg)   BP Readings from Last 3 Encounters:  01/10/15 148/76  07/10/14 140/94  06/19/14 126/80     Review of Systems  Constitutional: Negative for appetite change, fatigue and unexpected weight change.  HENT: Negative for congestion, nosebleeds, sneezing, sore throat and trouble swallowing.   Eyes: Negative for itching and visual disturbance.  Respiratory: Negative for cough.   Cardiovascular: Negative for chest pain, palpitations and leg swelling.  Gastrointestinal: Negative for nausea, diarrhea, blood in stool and abdominal distention.  Genitourinary: Negative for frequency and hematuria.  Musculoskeletal: Negative for back pain, joint swelling, gait problem and neck pain.  Skin: Negative for rash.  Neurological: Negative for dizziness, tremors, speech difficulty and weakness.  Psychiatric/Behavioral: Negative for sleep disturbance, dysphoric mood and agitation. The patient is not nervous/anxious.        Objective:   Physical Exam  Constitutional: He is oriented to person, place, and time. He appears well-developed. No distress.  NAD  HENT:  Mouth/Throat: Oropharynx is clear and moist.  Eyes: Conjunctivae are normal. Pupils are equal, round, and reactive to light.  Neck: Normal  range of motion. No JVD present. No thyromegaly present.  Cardiovascular: Normal rate, regular rhythm, normal heart sounds and intact distal pulses.  Exam reveals no gallop and no friction rub.   No murmur heard. Pulmonary/Chest: Effort normal and breath sounds normal. No respiratory distress. He has no wheezes. He has no rales. He exhibits no tenderness.  Abdominal: Soft. Bowel sounds are normal. He exhibits no distension and no mass. There is no tenderness. There is no rebound and no guarding.  Genitourinary: Rectum normal. Guaiac negative stool.  Musculoskeletal: Normal range of motion. He exhibits no edema or tenderness.  Lymphadenopathy:    He has no cervical adenopathy.  Neurological: He is alert and oriented to person, place, and time. He has normal reflexes. No cranial nerve deficit. He exhibits normal muscle tone. He displays a negative Romberg sign. Coordination and gait normal.  No meningeal signs  Skin: Skin is warm and dry. No rash noted.  Psychiatric: He has a normal mood and affect. His behavior is normal. Judgment and thought content normal.  abd w/midline hernia Prostate 2+   Lab Results  Component Value Date   WBC 5.1 07/10/2014   HGB 14.2 07/10/2014   HCT 43.0 07/10/2014   PLT 186.0 07/10/2014   CHOL 225* 07/10/2014   TRIG 233.0* 07/10/2014   HDL 31.60* 07/10/2014   LDLDIRECT 143.1 07/10/2014   ALT 16 07/10/2014   AST 18 07/10/2014   NA 142 07/10/2014   K 4.3 07/10/2014   CL 105 07/10/2014   CREATININE 1.2 07/10/2014   BUN 14 07/10/2014   CO2 28 07/10/2014   TSH 2.85 07/10/2014   PSA 3.60 07/10/2014  Assessment & Plan:

## 2015-01-10 NOTE — Progress Notes (Signed)
Pre visit review using our clinic review tool, if applicable. No additional management support is needed unless otherwise documented below in the visit note. 

## 2015-01-10 NOTE — Patient Instructions (Signed)
Preventive Care for Adults A healthy lifestyle and preventive care can promote health and wellness. Preventive health guidelines for men include the following key practices:  A routine yearly physical is a good way to check with your health care provider about your health and preventative screening. It is a chance to share any concerns and updates on your health and to receive a thorough exam.  Visit your dentist for a routine exam and preventative care every 6 months. Brush your teeth twice a day and floss once a day. Good oral hygiene prevents tooth decay and gum disease.  The frequency of eye exams is based on your age, health, family medical history, use of contact lenses, and other factors. Follow your health care provider's recommendations for frequency of eye exams.  Eat a healthy diet. Foods such as vegetables, fruits, whole grains, low-fat dairy products, and lean protein foods contain the nutrients you need without too many calories. Decrease your intake of foods high in solid fats, added sugars, and salt. Eat the right amount of calories for you.Get information about a proper diet from your health care provider, if necessary.  Regular physical exercise is one of the most important things you can do for your health. Most adults should get at least 150 minutes of moderate-intensity exercise (any activity that increases your heart rate and causes you to sweat) each week. In addition, most adults need muscle-strengthening exercises on 2 or more days a week.  Maintain a healthy weight. The body mass index (BMI) is a screening tool to identify possible weight problems. It provides an estimate of body fat based on height and weight. Your health care provider can find your BMI and can help you achieve or maintain a healthy weight.For adults 20 years and older:  A BMI below 18.5 is considered underweight.  A BMI of 18.5 to 24.9 is normal.  A BMI of 25 to 29.9 is considered overweight.  A BMI  of 30 and above is considered obese.  Maintain normal blood lipids and cholesterol levels by exercising and minimizing your intake of saturated fat. Eat a balanced diet with plenty of fruit and vegetables. Blood tests for lipids and cholesterol should begin at age 50 and be repeated every 5 years. If your lipid or cholesterol levels are high, you are over 50, or you are at high risk for heart disease, you may need your cholesterol levels checked more frequently.Ongoing high lipid and cholesterol levels should be treated with medicines if diet and exercise are not working.  If you smoke, find out from your health care provider how to quit. If you do not use tobacco, do not start.  Lung cancer screening is recommended for adults aged 73-80 years who are at high risk for developing lung cancer because of a history of smoking. A yearly low-dose CT scan of the lungs is recommended for people who have at least a 30-pack-year history of smoking and are a current smoker or have quit within the past 15 years. A pack year of smoking is smoking an average of 1 pack of cigarettes a day for 1 year (for example: 1 pack a day for 30 years or 2 packs a day for 15 years). Yearly screening should continue until the smoker has stopped smoking for at least 15 years. Yearly screening should be stopped for people who develop a health problem that would prevent them from having lung cancer treatment.  If you choose to drink alcohol, do not have more than  2 drinks per day. One drink is considered to be 12 ounces (355 mL) of beer, 5 ounces (148 mL) of wine, or 1.5 ounces (44 mL) of liquor.  Avoid use of street drugs. Do not share needles with anyone. Ask for help if you need support or instructions about stopping the use of drugs.  High blood pressure causes heart disease and increases the risk of stroke. Your blood pressure should be checked at least every 1-2 years. Ongoing high blood pressure should be treated with  medicines, if weight loss and exercise are not effective.  If you are 45-79 years old, ask your health care provider if you should take aspirin to prevent heart disease.  Diabetes screening involves taking a blood sample to check your fasting blood sugar level. This should be done once every 3 years, after age 45, if you are within normal weight and without risk factors for diabetes. Testing should be considered at a younger age or be carried out more frequently if you are overweight and have at least 1 risk factor for diabetes.  Colorectal cancer can be detected and often prevented. Most routine colorectal cancer screening begins at the age of 50 and continues through age 75. However, your health care provider may recommend screening at an earlier age if you have risk factors for colon cancer. On a yearly basis, your health care provider may provide home test kits to check for hidden blood in the stool. Use of a small camera at the end of a tube to directly examine the colon (sigmoidoscopy or colonoscopy) can detect the earliest forms of colorectal cancer. Talk to your health care provider about this at age 50, when routine screening begins. Direct exam of the colon should be repeated every 5-10 years through age 75, unless early forms of precancerous polyps or small growths are found.  People who are at an increased risk for hepatitis B should be screened for this virus. You are considered at high risk for hepatitis B if:  You were born in a country where hepatitis B occurs often. Talk with your health care provider about which countries are considered high risk.  Your parents were born in a high-risk country and you have not received a shot to protect against hepatitis B (hepatitis B vaccine).  You have HIV or AIDS.  You use needles to inject street drugs.  You live with, or have sex with, someone who has hepatitis B.  You are a man who has sex with other men (MSM).  You get hemodialysis  treatment.  You take certain medicines for conditions such as cancer, organ transplantation, and autoimmune conditions.  Hepatitis C blood testing is recommended for all people born from 1945 through 1965 and any individual with known risks for hepatitis C.  Practice safe sex. Use condoms and avoid high-risk sexual practices to reduce the spread of sexually transmitted infections (STIs). STIs include gonorrhea, chlamydia, syphilis, trichomonas, herpes, HPV, and human immunodeficiency virus (HIV). Herpes, HIV, and HPV are viral illnesses that have no cure. They can result in disability, cancer, and death.  If you are at risk of being infected with HIV, it is recommended that you take a prescription medicine daily to prevent HIV infection. This is called preexposure prophylaxis (PrEP). You are considered at risk if:  You are a man who has sex with other men (MSM) and have other risk factors.  You are a heterosexual man, are sexually active, and are at increased risk for HIV infection.    You take drugs by injection.  You are sexually active with a partner who has HIV.  Talk with your health care provider about whether you are at high risk of being infected with HIV. If you choose to begin PrEP, you should first be tested for HIV. You should then be tested every 3 months for as long as you are taking PrEP.  A one-time screening for abdominal aortic aneurysm (AAA) and surgical repair of large AAAs by ultrasound are recommended for men ages 32 to 67 years who are current or former smokers.  Healthy men should no longer receive prostate-specific antigen (PSA) blood tests as part of routine cancer screening. Talk with your health care provider about prostate cancer screening.  Testicular cancer screening is not recommended for adult males who have no symptoms. Screening includes self-exam, a health care provider exam, and other screening tests. Consult with your health care provider about any symptoms  you have or any concerns you have about testicular cancer.  Use sunscreen. Apply sunscreen liberally and repeatedly throughout the day. You should seek shade when your shadow is shorter than you. Protect yourself by wearing long sleeves, pants, a wide-brimmed hat, and sunglasses year round, whenever you are outdoors.  Once a month, do a whole-body skin exam, using a mirror to look at the skin on your back. Tell your health care provider about new moles, moles that have irregular borders, moles that are larger than a pencil eraser, or moles that have changed in shape or color.  Stay current with required vaccines (immunizations).  Influenza vaccine. All adults should be immunized every year.  Tetanus, diphtheria, and acellular pertussis (Td, Tdap) vaccine. An adult who has not previously received Tdap or who does not know his vaccine status should receive 1 dose of Tdap. This initial dose should be followed by tetanus and diphtheria toxoids (Td) booster doses every 10 years. Adults with an unknown or incomplete history of completing a 3-dose immunization series with Td-containing vaccines should begin or complete a primary immunization series including a Tdap dose. Adults should receive a Td booster every 10 years.  Varicella vaccine. An adult without evidence of immunity to varicella should receive 2 doses or a second dose if he has previously received 1 dose.  Human papillomavirus (HPV) vaccine. Males aged 68-21 years who have not received the vaccine previously should receive the 3-dose series. Males aged 22-26 years may be immunized. Immunization is recommended through the age of 6 years for any male who has sex with males and did not get any or all doses earlier. Immunization is recommended for any person with an immunocompromised condition through the age of 49 years if he did not get any or all doses earlier. During the 3-dose series, the second dose should be obtained 4-8 weeks after the first  dose. The third dose should be obtained 24 weeks after the first dose and 16 weeks after the second dose.  Zoster vaccine. One dose is recommended for adults aged 50 years or older unless certain conditions are present.  Measles, mumps, and rubella (MMR) vaccine. Adults born before [birth year] generally are considered immune to measles and mumps. Adults born in [birth year] or later should have 1 or more doses of MMR vaccine unless there is a contraindication to the vaccine or there is laboratory evidence of immunity to each of the three diseases. A routine second dose of MMR vaccine should be obtained at least 28 days after the first dose for students attending postsecondary  schools, health care workers, or international travelers. People who received inactivated measles vaccine or an unknown type of measles vaccine during 1963-1967 should receive 2 doses of MMR vaccine. People who received inactivated mumps vaccine or an unknown type of mumps vaccine before 1979 and are at high risk for mumps infection should consider immunization with 2 doses of MMR vaccine. Unvaccinated health care workers born before 1957 who lack laboratory evidence of measles, mumps, or rubella immunity or laboratory confirmation of disease should consider measles and mumps immunization with 2 doses of MMR vaccine or rubella immunization with 1 dose of MMR vaccine.  Pneumococcal 13-valent conjugate (PCV13) vaccine. When indicated, a person who is uncertain of his immunization history and has no record of immunization should receive the PCV13 vaccine. An adult aged 19 years or older who has certain medical conditions and has not been previously immunized should receive 1 dose of PCV13 vaccine. This PCV13 should be followed with a dose of pneumococcal polysaccharide (PPSV23) vaccine. The PPSV23 vaccine dose should be obtained at least 8 weeks after the dose of PCV13 vaccine. An adult aged 19 years or older who has certain medical conditions and  previously received 1 or more doses of PPSV23 vaccine should receive 1 dose of PCV13. The PCV13 vaccine dose should be obtained 1 or more years after the last PPSV23 vaccine dose.  Pneumococcal polysaccharide (PPSV23) vaccine. When PCV13 is also indicated, PCV13 should be obtained first. All adults aged 65 years and older should be immunized. An adult younger than age 65 years who has certain medical conditions should be immunized. Any person who resides in a nursing home or long-term care facility should be immunized. An adult smoker should be immunized. People with an immunocompromised condition and certain other conditions should receive both PCV13 and PPSV23 vaccines. People with human immunodeficiency virus (HIV) infection should be immunized as soon as possible after diagnosis. Immunization during chemotherapy or radiation therapy should be avoided. Routine use of PPSV23 vaccine is not recommended for American Indians, [place] Natives, or people younger than 65 years unless there are medical conditions that require PPSV23 vaccine. When indicated, people who have unknown immunization and have no record of immunization should receive PPSV23 vaccine. One-time revaccination 5 years after the first dose of PPSV23 is recommended for people aged 19-64 years who have chronic kidney failure, nephrotic syndrome, asplenia, or immunocompromised conditions. People who received 1-2 doses of PPSV23 before age 65 years should receive another dose of PPSV23 vaccine at age 65 years or later if at least 5 years have passed since the previous dose. Doses of PPSV23 are not needed for people immunized with PPSV23 at or after age 65 years.  Meningococcal vaccine. Adults with asplenia or persistent complement component deficiencies should receive 2 doses of quadrivalent meningococcal conjugate (MenACWY-D) vaccine. The doses should be obtained at least 2 months apart. Microbiologists working with certain meningococcal bacteria,  military recruits, people at risk during an outbreak, and people who travel to or live in countries with a high rate of meningitis should be immunized. A first-year college student up through age 21 years who is living in a residence hall should receive a dose if he did not receive a dose on or after his 16th birthday. Adults who have certain high-risk conditions should receive one or more doses of vaccine.  Hepatitis A vaccine. Adults who wish to be protected from this disease, have certain high-risk conditions, work with hepatitis A-infected animals, work in hepatitis A research labs, or   travel to or work in countries with a high rate of hepatitis A should be immunized. Adults who were previously unvaccinated and who anticipate close contact with an international adoptee during the first 60 days after arrival in the [place] from a country with a high rate of hepatitis A should be immunized.  Hepatitis B vaccine. Adults should be immunized if they wish to be protected from this disease, have certain high-risk conditions, may be exposed to blood or other infectious body fluids, are household contacts or sex partners of hepatitis B positive people, are clients or workers in certain care facilities, or travel to or work in countries with a high rate of hepatitis B.  Haemophilus influenzae type b (Hib) vaccine. A previously unvaccinated person with asplenia or sickle cell disease or having a scheduled splenectomy should receive 1 dose of Hib vaccine. Regardless of previous immunization, a recipient of a hematopoietic stem cell transplant should receive a 3-dose series 6-12 months after his successful transplant. Hib vaccine is not recommended for adults with HIV infection. Preventive Service / Frequency Ages 52 to 17  Blood pressure check.** / Every 1 to 2 years.  Lipid and cholesterol check.** / Every 5 years beginning at age 69.  Hepatitis C blood test.** / For any individual with known risks for  hepatitis C.  Skin self-exam. / Monthly.  Influenza vaccine. / Every year.  Tetanus, diphtheria, and acellular pertussis (Tdap, Td) vaccine.** / Consult your health care provider. 1 dose of Td every 10 years.  Varicella vaccine.** / Consult your health care provider.  HPV vaccine. / 3 doses over 6 months, if 72 or younger.  Measles, mumps, rubella (MMR) vaccine.** / You need at least 1 dose of MMR if you were born in 1957 or later. You may also need a second dose.  Pneumococcal 13-valent conjugate (PCV13) vaccine.** / Consult your health care provider.  Pneumococcal polysaccharide (PPSV23) vaccine.** / 1 to 2 doses if you smoke cigarettes or if you have certain conditions.  Meningococcal vaccine.** / 1 dose if you are age 35 to 60 years and a Market researcher living in a residence hall, or have one of several medical conditions. You may also need additional booster doses.  Hepatitis A vaccine.** / Consult your health care provider.  Hepatitis B vaccine.** / Consult your health care provider.  Haemophilus influenzae type b (Hib) vaccine.** / Consult your health care provider. Ages 35 to 8  Blood pressure check.** / Every 1 to 2 years.  Lipid and cholesterol check.** / Every 5 years beginning at age 57.  Lung cancer screening. / Every year if you are aged 44-80 years and have a 30-pack-year history of smoking and currently smoke or have quit within the past 15 years. Yearly screening is stopped once you have quit smoking for at least 15 years or develop a health problem that would prevent you from having lung cancer treatment.  Fecal occult blood test (FOBT) of stool. / Every year beginning at age 55 and continuing until age 73. You may not have to do this test if you get a colonoscopy every 10 years.  Flexible sigmoidoscopy** or colonoscopy.** / Every 5 years for a flexible sigmoidoscopy or every 10 years for a colonoscopy beginning at age 28 and continuing until age  1.  Hepatitis C blood test.** / For all people born from [birth year] through 1965 and any individual with known risks for hepatitis C.  Skin self-exam. / Monthly.  Influenza vaccine. / Every  year.  Tetanus, diphtheria, and acellular pertussis (Tdap/Td) vaccine.** / Consult your health care provider. 1 dose of Td every 10 years.  Varicella vaccine.** / Consult your health care provider.  Zoster vaccine.** / 1 dose for adults aged 53 years or older.  Measles, mumps, rubella (MMR) vaccine.** / You need at least 1 dose of MMR if you were born in 1957 or later. You may also need a second dose.  Pneumococcal 13-valent conjugate (PCV13) vaccine.** / Consult your health care provider.  Pneumococcal polysaccharide (PPSV23) vaccine.** / 1 to 2 doses if you smoke cigarettes or if you have certain conditions.  Meningococcal vaccine.** / Consult your health care provider.  Hepatitis A vaccine.** / Consult your health care provider.  Hepatitis B vaccine.** / Consult your health care provider.  Haemophilus influenzae type b (Hib) vaccine.** / Consult your health care provider. Ages 77 and over  Blood pressure check.** / Every 1 to 2 years.  Lipid and cholesterol check.**/ Every 5 years beginning at age 85.  Lung cancer screening. / Every year if you are aged 55-80 years and have a 30-pack-year history of smoking and currently smoke or have quit within the past 15 years. Yearly screening is stopped once you have quit smoking for at least 15 years or develop a health problem that would prevent you from having lung cancer treatment.  Fecal occult blood test (FOBT) of stool. / Every year beginning at age 33 and continuing until age 11. You may not have to do this test if you get a colonoscopy every 10 years.  Flexible sigmoidoscopy** or colonoscopy.** / Every 5 years for a flexible sigmoidoscopy or every 10 years for a colonoscopy beginning at age 28 and continuing until age 73.  Hepatitis C blood  test.** / For all people born from [birth year] through 1965 and any individual with known risks for hepatitis C.  Abdominal aortic aneurysm (AAA) screening.** / A one-time screening for ages 50 to 27 years who are current or former smokers.  Skin self-exam. / Monthly.  Influenza vaccine. / Every year.  Tetanus, diphtheria, and acellular pertussis (Tdap/Td) vaccine.** / 1 dose of Td every 10 years.  Varicella vaccine.** / Consult your health care provider.  Zoster vaccine.** / 1 dose for adults aged 34 years or older.  Pneumococcal 13-valent conjugate (PCV13) vaccine.** / Consult your health care provider.  Pneumococcal polysaccharide (PPSV23) vaccine.** / 1 dose for all adults aged 63 years and older.  Meningococcal vaccine.** / Consult your health care provider.  Hepatitis A vaccine.** / Consult your health care provider.  Hepatitis B vaccine.** / Consult your health care provider.  Haemophilus influenzae type b (Hib) vaccine.** / Consult your health care provider. **Family history and personal history of risk and conditions may change your health care provider's recommendations. Document Released: 09/29/2001 Document Revised: 08/08/2013 Document Reviewed: 12/29/2010 [facility] Patient Information 2015 [place], [place]. This information is not intended to replace advice given to you by your health care provider. Make sure you discuss any questions you have with your health care provider.

## 2015-01-15 ENCOUNTER — Telehealth: Payer: Self-pay

## 2015-01-15 NOTE — Telephone Encounter (Signed)
Pt returned call and informed of lab results.

## 2015-03-07 DIAGNOSIS — M7062 Trochanteric bursitis, left hip: Secondary | ICD-10-CM | POA: Diagnosis not present

## 2015-03-07 DIAGNOSIS — M5136 Other intervertebral disc degeneration, lumbar region: Secondary | ICD-10-CM | POA: Diagnosis not present

## 2015-03-19 ENCOUNTER — Encounter: Payer: Self-pay | Admitting: Gastroenterology

## 2015-04-01 ENCOUNTER — Encounter: Payer: Self-pay | Admitting: Podiatry

## 2015-04-01 ENCOUNTER — Ambulatory Visit (INDEPENDENT_AMBULATORY_CARE_PROVIDER_SITE_OTHER): Payer: Medicare Other | Admitting: Podiatry

## 2015-04-01 VITALS — BP 134/63 | HR 80 | Resp 16

## 2015-04-01 DIAGNOSIS — L03012 Cellulitis of left finger: Secondary | ICD-10-CM

## 2015-04-01 DIAGNOSIS — L03032 Cellulitis of left toe: Secondary | ICD-10-CM | POA: Diagnosis not present

## 2015-04-01 MED ORDER — CEPHALEXIN 500 MG PO CAPS: 500.0000 mg | ORAL_CAPSULE | Freq: Two times a day (BID) | ORAL | Status: DC

## 2015-04-01 NOTE — Progress Notes (Signed)
   Subjective:    Patient ID: Curtis Jacobs, male    DOB: 07/08/1933, 81 y.o.   MRN: [id number]  HPI Pt presents with left great nail ingrown on medial border, noted blistering and itching    Review of Systems  All other systems reviewed and are negative.      Objective:   Physical Exam        Assessment & Plan:

## 2015-04-01 NOTE — Patient Instructions (Signed)

## 2015-04-02 ENCOUNTER — Telehealth: Payer: Self-pay | Admitting: *Deleted

## 2015-04-02 NOTE — Telephone Encounter (Signed)
Patient called me back and stated "feels pretty good and did fine last night. Was not in too much pain. 80% of pain is gone now".

## 2015-04-02 NOTE — Telephone Encounter (Signed)
Left message at [phone] (Home #) for patient to check to see how they were doing from their ingrown toenail procedure that was done on Tuesday, April 01, 2015. Waiting for a response.

## 2015-04-02 NOTE — Progress Notes (Signed)
Subjective:     Patient ID: Curtis Jacobs, male   DOB: [birth year], 81 y.o.   MRN: [id number]  HPI patient presents stating my left big toenail has become red and sore on the inside and I tried to trim it myself and ended up   Review of Systems  All other systems reviewed and are negative.      Objective:   Physical Exam  Constitutional: He is oriented to person, place, and time.  Cardiovascular: Intact distal pulses.   Musculoskeletal: Normal range of motion.  Neurological: He is oriented to person, place, and time.  Skin: Skin is warm.  Nursing note and vitals reviewed.  neurovascular status was found to be intact with muscle strength and I noted on the right hallux medial border there is redness in the proximal nail fold with no erythema or edema proximal to this point. It is localized but it is painful when pressed and there appears to be also some incurvation of the nailbed. 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 At this time I infiltrated the right hallux 60 mg Xylocaine Marcaine mixture remove the medial border removed proud flesh abscess tissue allowed channel for drainage and applied sterile dressing. Gave instructions on soaks and reappoint and may ultimately require a permanent procedure

## 2015-04-08 ENCOUNTER — Other Ambulatory Visit: Payer: Self-pay | Admitting: Internal Medicine

## 2015-04-23 DIAGNOSIS — L239 Allergic contact dermatitis, unspecified cause: Secondary | ICD-10-CM | POA: Diagnosis not present

## 2015-04-23 DIAGNOSIS — D1801 Hemangioma of skin and subcutaneous tissue: Secondary | ICD-10-CM | POA: Diagnosis not present

## 2015-04-23 DIAGNOSIS — L812 Freckles: Secondary | ICD-10-CM | POA: Diagnosis not present

## 2015-04-23 DIAGNOSIS — Z85828 Personal history of other malignant neoplasm of skin: Secondary | ICD-10-CM | POA: Diagnosis not present

## 2015-04-23 DIAGNOSIS — L2089 Other atopic dermatitis: Secondary | ICD-10-CM | POA: Diagnosis not present

## 2015-07-15 ENCOUNTER — Ambulatory Visit (INDEPENDENT_AMBULATORY_CARE_PROVIDER_SITE_OTHER)
Admission: RE | Admit: 2015-07-15 | Discharge: 2015-07-15 | Disposition: A | Discharge status: Home / Self Care | Payer: Medicare Other | Source: Ambulatory Visit | Attending: Pulmonary Disease | Admitting: Pulmonary Disease

## 2015-07-15 DIAGNOSIS — R911 Solitary pulmonary nodule: Secondary | ICD-10-CM | POA: Diagnosis not present

## 2015-07-15 DIAGNOSIS — R918 Other nonspecific abnormal finding of lung field: Secondary | ICD-10-CM | POA: Diagnosis not present

## 2015-07-16 ENCOUNTER — Encounter: Payer: Self-pay | Admitting: Internal Medicine

## 2015-07-16 ENCOUNTER — Other Ambulatory Visit (INDEPENDENT_AMBULATORY_CARE_PROVIDER_SITE_OTHER): Payer: Medicare Other

## 2015-07-16 ENCOUNTER — Ambulatory Visit (INDEPENDENT_AMBULATORY_CARE_PROVIDER_SITE_OTHER): Payer: Medicare Other | Admitting: Internal Medicine

## 2015-07-16 VITALS — BP 125/75 | HR 80 | Wt 204.0 lb

## 2015-07-16 DIAGNOSIS — R945 Abnormal results of liver function studies: Secondary | ICD-10-CM

## 2015-07-16 DIAGNOSIS — R972 Elevated prostate specific antigen [PSA]: Secondary | ICD-10-CM | POA: Diagnosis not present

## 2015-07-16 DIAGNOSIS — R7989 Other specified abnormal findings of blood chemistry: Secondary | ICD-10-CM

## 2015-07-16 DIAGNOSIS — N400 Enlarged prostate without lower urinary tract symptoms: Secondary | ICD-10-CM

## 2015-07-16 DIAGNOSIS — E785 Hyperlipidemia, unspecified: Secondary | ICD-10-CM

## 2015-07-16 DIAGNOSIS — R911 Solitary pulmonary nodule: Secondary | ICD-10-CM

## 2015-07-16 DIAGNOSIS — I10 Essential (primary) hypertension: Secondary | ICD-10-CM | POA: Diagnosis not present

## 2015-07-16 LAB — HEPATIC FUNCTION PANEL
ALBUMIN: 3.8 g/dL (ref 3.5–5.2)
ALT: 14 U/L (ref 0–53)
AST: 14 U/L (ref 0–37)
Alkaline Phosphatase: 99 U/L (ref 39–117)
BILIRUBIN DIRECT: 0.1 mg/dL (ref 0.0–0.3)
TOTAL PROTEIN: 6.5 g/dL (ref 6.0–8.3)
Total Bilirubin: 0.4 mg/dL (ref 0.2–1.2)

## 2015-07-16 LAB — BASIC METABOLIC PANEL
BUN: 15 mg/dL (ref 6–23)
CALCIUM: 9.4 mg/dL (ref 8.4–10.5)
CO2: 28 mEq/L (ref 19–32)
CREATININE: 1.11 mg/dL (ref 0.40–1.50)
Chloride: 109 mEq/L (ref 96–112)
GFR: 67.74 mL/min (ref >60.00)
GLUCOSE: 114 mg/dL — AB (ref 70–99)
POTASSIUM: 3.8 meq/L (ref 3.5–5.1)
SODIUM: 143 meq/L (ref 135–145)

## 2015-07-16 MED ORDER — LOSARTAN POTASSIUM 100 MG PO TABS: 100.0000 mg | ORAL_TABLET | Freq: Every day | ORAL | Status: DC

## 2015-07-16 NOTE — Assessment & Plan Note (Signed)
Chronic Losartan, Hytrin

## 2015-07-16 NOTE — Assessment & Plan Note (Signed)
Dr Risa Grill -- s/p prostate bx ?2009 OK On Hytrin

## 2015-07-16 NOTE — Progress Notes (Signed)
Pre visit review using our clinic review tool, if applicable. No additional management support is needed unless otherwise documented below in the visit note. 

## 2015-07-16 NOTE — Progress Notes (Signed)
Subjective:  Patient ID: Curtis Jacobs, male    DOB: [birth year]  Age: 81 y.o. MRN: [id number]  CC: No chief complaint on file.   HPI Curtis Jacobs presents for HTN, bronchiectases, OA f/u  Outpatient Prescriptions Prior to Visit  Medication Sig Dispense Refill  . Cholecalciferol 1000 UNITS tablet Take 2,000 Units by mouth daily.     . fluticasone (FLONASE) 50 MCG/ACT nasal spray Place 2 sprays into both nostrils daily. 16 g 3  . Glucosamine-Chondroit-Vit C-Mn (GLUCOSAMINE CHONDROITIN COMPLX) CAPS Take 1 capsule by mouth daily.      Marland Kitchen loratadine (CLARITIN) 10 MG tablet Take 10 mg by mouth daily.    . Multiple Vitamin (MULTIVITAMIN) tablet Take 1 tablet by mouth daily.      . niacin 500 MG CR capsule Take 500 mg by mouth at bedtime.      [name] POWD by Does not apply route daily.      Marland Kitchen terazosin (HYTRIN) 10 MG capsule TAKE ONE CAPSULE BY MOUTH AT BEDTIME 30 capsule 11  . vitamin C (ASCORBIC ACID) 500 MG tablet Take 500 mg by mouth daily.      . budesonide-formoterol (SYMBICORT) 80-4.5 MCG/ACT inhaler Inhale 2 puffs into the lungs 2 (two) times daily. 1 Inhaler 12  . losartan (COZAAR) 100 MG tablet TAKE 1 TABLET BY MOUTH DAILY 30 tablet 5  . Respiratory Therapy Supplies (FLUTTER) DEVI 1 Device by Does not apply route 3 (three) times daily. 1 each 0  . Spacer/Aero-Holding Chambers (AEROCHAMBER MV) inhaler Use as instructed 1 each 0  . cephALEXin (KEFLEX) 500 MG capsule Take 1 capsule (500 mg total) by mouth 2 (two) times daily. (Patient not taking: Reported on 07/16/2015) 20 capsule 0   Facility-Administered Medications Prior to Visit  Medication Dose Route Frequency Provider Last Rate Last Dose  . methylPREDNISolone acetate (DEPO-MEDROL) 20 MG/ML injection 20 mg  20 mg Intra-Lesional Once Annalyn Blecher V, MD      . methylPREDNISolone acetate (DEPO-MEDROL) injection 40 mg  40 mg Intra-articular Once Tabari Volkert V, MD      . methylPREDNISolone acetate (DEPO-MEDROL)  injection 40 mg  40 mg Intra-articular Once Liberta Gimpel V, MD        ROS Review of Systems  Constitutional: Negative for appetite change, fatigue and unexpected weight change.  HENT: Negative for congestion, nosebleeds, sneezing, sore throat and trouble swallowing.   Eyes: Negative for itching and visual disturbance.  Respiratory: Negative for cough.   Cardiovascular: Negative for chest pain, palpitations and leg swelling.  Gastrointestinal: Negative for nausea, diarrhea, blood in stool and abdominal distention.  Genitourinary: Negative for frequency and hematuria.  Musculoskeletal: Positive for back pain. Negative for joint swelling, gait problem and neck pain.  Skin: Negative for rash.  Neurological: Negative for dizziness, tremors, speech difficulty and weakness.  Psychiatric/Behavioral: Negative for sleep disturbance, dysphoric mood and agitation. The patient is not nervous/anxious.     Objective:  BP 125/75 mmHg  Pulse 80  Wt 204 lb (92.534 kg)  SpO2 97%  BP Readings from Last 3 Encounters:  07/16/15 125/75  04/01/15 134/63  01/10/15 148/76    Wt Readings from Last 3 Encounters:  07/16/15 204 lb (92.534 kg)  01/10/15 205 lb (92.987 kg)  07/10/14 206 lb (93.441 kg)    Physical Exam  Constitutional: He is oriented to person, place, and time. He appears well-developed. No distress.  NAD  HENT:  Mouth/Throat: Oropharynx is clear and moist.  Eyes: Conjunctivae are  normal. Pupils are equal, round, and reactive to light.  Neck: Normal range of motion. No JVD present. No thyromegaly present.  Cardiovascular: Normal rate, regular rhythm, normal heart sounds and intact distal pulses.  Exam reveals no gallop and no friction rub.   No murmur heard. Pulmonary/Chest: Effort normal and breath sounds normal. No respiratory distress. He has no wheezes. He has no rales. He exhibits no tenderness.  Abdominal: Soft. Bowel sounds are normal. He exhibits no distension and no mass.  There is no tenderness. There is no rebound and no guarding.  Musculoskeletal: Normal range of motion. He exhibits tenderness. He exhibits no edema.  Lymphadenopathy:    He has no cervical adenopathy.  Neurological: He is alert and oriented to person, place, and time. He has normal reflexes. No cranial nerve deficit. He exhibits normal muscle tone. He displays a negative Romberg sign. Coordination and gait normal.  Skin: Skin is warm and dry. No rash noted.  Psychiatric: He has a normal mood and affect. His behavior is normal. Judgment and thought content normal.  LS is tender  Lab Results  Component Value Date   WBC 5.0 01/10/2015   HGB 14.1 01/10/2015   HCT 41.5 01/10/2015   PLT 160.0 01/10/2015   GLUCOSE 116* 01/10/2015   CHOL 206* 01/10/2015   TRIG 244.0* 01/10/2015   HDL 32.60* 01/10/2015   LDLDIRECT 101.0 01/10/2015   ALT 16 01/10/2015   AST 16 01/10/2015   NA 139 01/10/2015   K 4.0 01/10/2015   CL 106 01/10/2015   CREATININE 1.19 01/10/2015   BUN 14 01/10/2015   CO2 30 01/10/2015   TSH 1.25 01/10/2015   PSA 4.85* 01/10/2015    Ct Chest Wo Contrast  07/15/2015  CLINICAL DATA:  Followup indeterminate pulmonary nodules. EXAM: CT CHEST WITHOUT CONTRAST TECHNIQUE: Multidetector CT imaging of the chest was performed following the standard protocol without IV contrast. COMPARISON:  12/28/2013 and 07/14/2013 FINDINGS: Mediastinum/Lymph Nodes: No masses or pathologically enlarged lymph nodes identified on this un-enhanced exam. Lungs/Pleura: Scarring is again seen with upper lobe predominance and mild traction bronchiectasis. No evidence of acute infiltrate or pleural effusion. Scattered sub-cm bilateral pulmonary nodular densities are again seen without significant change. A dominant pulmonary nodule is seen in the left upper lobe measuring 11 x 13 mm on image 23/series 3 which is stable. No new or enlarging pulmonary nodules identified. Upper abdomen: Cholelithiasis again noted.  Musculoskeletal: No chest wall mass or suspicious bone lesions identified. IMPRESSION: Stable bilateral pleural-parenchymal scarring and mild bronchiectasis with upper lung predominance. Stable bilateral pulmonary nodules, largest measuring 13 mm in the left upper lobe, consistent with benign postinflammatory etiology. Electronically Signed   By: Myles Rosenthal M.D.   On: 07/15/2015 09:48    Assessment & Plan:   Diagnoses and all orders for this visit:  Essential hypertension  BPH (benign prostatic hyperplasia)  Dyslipidemia  PSA, INCREASED -     PSA, total and free; Future  Pulmonary nodule, left  LFT elevation -     Hepatitis C antibody; Future -     Hepatic function panel; Future -     Basic metabolic panel; Future  Other orders -     losartan (COZAAR) 100 MG tablet; Take 1 tablet (100 mg total) by mouth daily.  I have discontinued Mr. Schartz's FLUTTER, AEROCHAMBER MV, budesonide-formoterol, and cephALEXin. I have also changed his losartan. Additionally, I am having him maintain his GLUCOSAMINE CHONDROITIN COMPLX, multivitamin, niacin, Saw Palmetto, vitamin C, Cholecalciferol, fluticasone, loratadine,  and terazosin. We will continue to administer methylPREDNISolone acetate, methylPREDNISolone acetate, and methylPREDNISolone acetate.  Meds ordered this encounter  Medications  . losartan (COZAAR) 100 MG tablet    Sig: Take 1 tablet (100 mg total) by mouth daily.    Dispense:  90 tablet    Refill:  3     Follow-up: Return in about 6 months (around 01/13/2016) for Wellness Exam.  Sonda Primes, MD

## 2015-07-16 NOTE — Assessment & Plan Note (Signed)
CT Chest - no change

## 2015-07-16 NOTE — Assessment & Plan Note (Signed)
Chronic   Dr Risa Grill -- s/p prostate bx ?2009 OK

## 2015-07-16 NOTE — Assessment & Plan Note (Signed)
Pt declined statins Labs

## 2015-07-17 LAB — PSA, TOTAL AND FREE
PSA FREE: 1.15 ng/mL
PSA, Free Pct: 27 % (ref >25)
PSA: 4.27 ng/mL — ABNORMAL HIGH (ref <=4.00)

## 2015-07-17 LAB — HEPATITIS C ANTIBODY: HCV AB: NEGATIVE

## 2015-07-18 NOTE — Progress Notes (Signed)
Quick Note:  LM with wife Tilda Burrow for patient to return call. ______

## 2015-08-05 ENCOUNTER — Encounter (INDEPENDENT_AMBULATORY_CARE_PROVIDER_SITE_OTHER): Payer: Self-pay

## 2015-08-05 ENCOUNTER — Ambulatory Visit (INDEPENDENT_AMBULATORY_CARE_PROVIDER_SITE_OTHER): Payer: Medicare Other | Admitting: Pulmonary Disease

## 2015-08-05 ENCOUNTER — Encounter: Payer: Self-pay | Admitting: Pulmonary Disease

## 2015-08-05 VITALS — BP 146/82 | HR 63 | Ht 70.0 in | Wt 203.0 lb

## 2015-08-05 DIAGNOSIS — J479 Bronchiectasis, uncomplicated: Secondary | ICD-10-CM | POA: Diagnosis not present

## 2015-08-05 DIAGNOSIS — R911 Solitary pulmonary nodule: Secondary | ICD-10-CM

## 2015-08-05 MED ORDER — GUAIFENESIN ER 600 MG PO TB12: 1200.0000 mg | ORAL_TABLET | Freq: Two times a day (BID) | ORAL | Status: DC

## 2015-08-05 NOTE — Assessment & Plan Note (Signed)
His most recent CT chest was showed no growth, consistent with benign disease.  No further imaging needed.

## 2015-08-05 NOTE — Progress Notes (Signed)
Subjective:    Patient ID: Curtis Jacobs, male    DOB: 02/28/1933, 81 y.o.   MRN: [id number]  Synopsis: Mr. Demott has bronchiectasis (post infectious) and a pulmonary nodule.  He had normal spirometry on the first visit with [facility] in 2014.  His bronchiectasis is likely from a bad pneumonia in the 1970's.    HPI Chief Complaint  Patient presents with  . Follow-up    pt c/o DOE, nonprod cough.     Curtis Jacobs is here for his annual visit for his bronchiectasis. No major respiratory illnesses, no flare ups, no bronchitis.  He has been trying to stay away from infections as best as possible. He still has a long cough in the mornings which is typically dry at first.  He still takes guaifenesin twice a day typically.     Past Medical History  Diagnosis Date  . BPH (benign prostatic hypertrophy)     Dr. Risa Grill  . Hyperlipidemia   . HTN (hypertension)   . HA (headache)     Traction- Dr. Jannifer Franklin- had MRI.MRA     Review of Systems  Constitutional: Negative for fever, chills and fatigue.  HENT: Negative for postnasal drip, rhinorrhea and sinus pressure.   Respiratory: Positive for cough. Negative for shortness of breath and wheezing.   Cardiovascular: Negative for chest pain, palpitations and leg swelling.       Objective:   Physical Exam  Filed Vitals:   08/05/15 0920  BP: 146/82  Pulse: 63  Height: 5\' 10"  (1.778 m)  Weight: 203 lb (92.08 kg)  SpO2: 97%  RA  Gen: well appearing HEENT: NCAT, PERRL, EOMi PULM: CTA B today CV: RRR, no mgr AB: BS+ soft, nontender Ext: warm, no edema  06/2015 CT chest > no change in nodules      Assessment & Plan:   Pulmonary nodule, left His most recent CT chest was showed no growth, consistent with benign disease.  No further imaging needed.  Bronchiectasis without acute exacerbation ([place]) All this has been a stable interval for him. We have seen no major changes in the last year. He has not had any exacerbations. He needs  to keep giving a flu shot annually. Today because of the stability of his disease I gave him the option to just follow-up with [place] on an as-needed basis or annually. He would prefer to stay with [place] annually.    Updated Medication List Outpatient Encounter Prescriptions as of 08/05/2015  Medication Sig  . Cholecalciferol 1000 UNITS tablet Take 2,000 Units by mouth daily.   . Glucosamine-Chondroit-Vit C-Mn (GLUCOSAMINE CHONDROITIN COMPLX) CAPS Take 1 capsule by mouth daily.    Marland Kitchen loratadine (CLARITIN) 10 MG tablet Take 10 mg by mouth daily.  Marland Kitchen losartan (COZAAR) 100 MG tablet Take 1 tablet (100 mg total) by mouth daily.  . Multiple Vitamin (MULTIVITAMIN) tablet Take 1 tablet by mouth daily.    . niacin 500 MG CR capsule Take 500 mg by mouth at bedtime.    [name] POWD by Does not apply route daily.    Marland Kitchen terazosin (HYTRIN) 10 MG capsule TAKE ONE CAPSULE BY MOUTH AT BEDTIME  . vitamin C (ASCORBIC ACID) 500 MG tablet Take 500 mg by mouth daily.    Marland Kitchen guaiFENesin (MUCINEX) 600 MG 12 hr tablet Take 2 tablets (1,200 mg total) by mouth 2 (two) times daily.  . [DISCONTINUED] fluticasone (FLONASE) 50 MCG/ACT nasal spray Place 2 sprays into both nostrils daily. (Patient not taking:  Reported on 08/05/2015)   Facility-Administered Encounter Medications as of 08/05/2015  Medication  . methylPREDNISolone acetate (DEPO-MEDROL) 20 MG/ML injection 20 mg  . methylPREDNISolone acetate (DEPO-MEDROL) injection 40 mg  . methylPREDNISolone acetate (DEPO-MEDROL) injection 40 mg

## 2015-08-05 NOTE — Assessment & Plan Note (Signed)
All this has been a stable interval for him. We have seen no major changes in the last year. He has not had any exacerbations. He needs to keep giving a flu shot annually. Today because of the stability of his disease I gave him the option to just follow-up with [place] on an as-needed basis or annually. He would prefer to stay with [place] annually.

## 2015-08-13 DIAGNOSIS — J069 Acute upper respiratory infection, unspecified: Secondary | ICD-10-CM | POA: Diagnosis not present

## 2015-10-25 DIAGNOSIS — Z85828 Personal history of other malignant neoplasm of skin: Secondary | ICD-10-CM | POA: Diagnosis not present

## 2015-10-25 DIAGNOSIS — D1801 Hemangioma of skin and subcutaneous tissue: Secondary | ICD-10-CM | POA: Diagnosis not present

## 2015-10-25 DIAGNOSIS — L821 Other seborrheic keratosis: Secondary | ICD-10-CM | POA: Diagnosis not present

## 2015-10-25 DIAGNOSIS — D2272 Melanocytic nevi of left lower limb, including hip: Secondary | ICD-10-CM | POA: Diagnosis not present

## 2015-10-25 DIAGNOSIS — L570 Actinic keratosis: Secondary | ICD-10-CM | POA: Diagnosis not present

## 2015-12-19 DIAGNOSIS — Z0100 Encounter for examination of eyes and vision without abnormal findings: Secondary | ICD-10-CM | POA: Diagnosis not present

## 2015-12-19 DIAGNOSIS — H2513 Age-related nuclear cataract, bilateral: Secondary | ICD-10-CM | POA: Diagnosis not present

## 2016-01-14 ENCOUNTER — Other Ambulatory Visit (INDEPENDENT_AMBULATORY_CARE_PROVIDER_SITE_OTHER): Payer: Medicare Other

## 2016-01-14 ENCOUNTER — Encounter: Payer: Self-pay | Admitting: Internal Medicine

## 2016-01-14 ENCOUNTER — Ambulatory Visit (INDEPENDENT_AMBULATORY_CARE_PROVIDER_SITE_OTHER): Payer: Medicare Other | Admitting: Internal Medicine

## 2016-01-14 VITALS — BP 160/70 | HR 68 | Ht 70.0 in | Wt 201.0 lb

## 2016-01-14 DIAGNOSIS — I4510 Unspecified right bundle-branch block: Secondary | ICD-10-CM

## 2016-01-14 DIAGNOSIS — R972 Elevated prostate specific antigen [PSA]: Secondary | ICD-10-CM | POA: Diagnosis not present

## 2016-01-14 DIAGNOSIS — J479 Bronchiectasis, uncomplicated: Secondary | ICD-10-CM

## 2016-01-14 DIAGNOSIS — Z0000 Encounter for general adult medical examination without abnormal findings: Secondary | ICD-10-CM

## 2016-01-14 DIAGNOSIS — I10 Essential (primary) hypertension: Secondary | ICD-10-CM | POA: Diagnosis not present

## 2016-01-14 DIAGNOSIS — Z23 Encounter for immunization: Secondary | ICD-10-CM

## 2016-01-14 LAB — CBC WITH DIFFERENTIAL/PLATELET
BASOS ABS: 0 10*3/uL (ref 0.0–0.1)
BASOS PCT: 0.7 % (ref 0.0–3.0)
EOS ABS: 0.1 10*3/uL (ref 0.0–0.7)
Eosinophils Relative: 1.7 % (ref 0.0–5.0)
HCT: 40.7 % (ref 39.0–52.0)
Hemoglobin: 14 g/dL (ref 13.0–17.0)
LYMPHS ABS: 1.4 10*3/uL (ref 0.7–4.0)
Lymphocytes Relative: 28.5 % (ref 12.0–46.0)
MCHC: 34.3 g/dL (ref 30.0–36.0)
MCV: 89.6 fl (ref 78.0–100.0)
MONO ABS: 0.4 10*3/uL (ref 0.1–1.0)
Monocytes Relative: 7.6 % (ref 3.0–12.0)
NEUTROS ABS: 3.1 10*3/uL (ref 1.4–7.7)
NEUTROS PCT: 61.5 % (ref 43.0–77.0)
PLATELETS: 172 10*3/uL (ref 150.0–400.0)
RBC: 4.55 Mil/uL (ref 4.22–5.81)
RDW: 13.1 % (ref 11.5–15.5)
WBC: 5 10*3/uL (ref 4.0–10.5)

## 2016-01-14 LAB — URINALYSIS
BILIRUBIN URINE: NEGATIVE
HGB URINE DIPSTICK: NEGATIVE
KETONES UR: NEGATIVE
Leukocytes, UA: NEGATIVE
NITRITE: NEGATIVE
SPECIFIC GRAVITY, URINE: 1.02 (ref 1.000–1.030)
TOTAL PROTEIN, URINE-UPE24: NEGATIVE
Urine Glucose: NEGATIVE
Urobilinogen, UA: 0.2 (ref 0.0–1.0)
pH: 5.5 (ref 5.0–8.0)

## 2016-01-14 LAB — BASIC METABOLIC PANEL
BUN: 15 mg/dL (ref 6–23)
CO2: 27 mEq/L (ref 19–32)
CREATININE: 1.1 mg/dL (ref 0.40–1.50)
Calcium: 9.2 mg/dL (ref 8.4–10.5)
Chloride: 111 mEq/L (ref 96–112)
GFR: 68.36 mL/min (ref >60.00)
GLUCOSE: 91 mg/dL (ref 70–99)
Potassium: 4.2 mEq/L (ref 3.5–5.1)
Sodium: 141 mEq/L (ref 135–145)

## 2016-01-14 LAB — HEPATIC FUNCTION PANEL
ALK PHOS: 100 U/L (ref 39–117)
ALT: 16 U/L (ref 0–53)
AST: 17 U/L (ref 0–37)
Albumin: 4 g/dL (ref 3.5–5.2)
BILIRUBIN DIRECT: 0.1 mg/dL (ref 0.0–0.3)
BILIRUBIN TOTAL: 0.7 mg/dL (ref 0.2–1.2)
TOTAL PROTEIN: 6.3 g/dL (ref 6.0–8.3)

## 2016-01-14 LAB — LIPID PANEL
CHOL/HDL RATIO: 6
Cholesterol: 199 mg/dL (ref 0–200)
HDL: 35.1 mg/dL — AB (ref >39.00)
LDL CALC: 133 mg/dL — AB (ref 0–99)
NonHDL: 163.72
TRIGLYCERIDES: 155 mg/dL — AB (ref 0.0–149.0)
VLDL: 31 mg/dL (ref 0.0–40.0)

## 2016-01-14 LAB — PSA: PSA: 3.76 ng/mL (ref 0.10–4.00)

## 2016-01-14 LAB — TSH: TSH: 1.42 u[IU]/mL (ref 0.35–4.50)

## 2016-01-14 NOTE — Progress Notes (Signed)
Subjective:  Patient ID: Curtis Jacobs, male    DOB: 1933-06-11  Age: 82 y.o. MRN: [id number]  CC: No chief complaint on file.   HPI Curtis Jacobs presents for a well exam.   F/u cough, abn CT chest. Pt did not take his meds this am. F/u RBBB - no sx's  Outpatient Prescriptions Prior to Visit  Medication Sig Dispense Refill  . Cholecalciferol 1000 UNITS tablet Take 2,000 Units by mouth daily.     . Glucosamine-Chondroit-Vit C-Mn (GLUCOSAMINE CHONDROITIN COMPLX) CAPS Take 1 capsule by mouth daily.      Marland Kitchen guaiFENesin (MUCINEX) 600 MG 12 hr tablet Take 2 tablets (1,200 mg total) by mouth 2 (two) times daily.    Marland Kitchen loratadine (CLARITIN) 10 MG tablet Take 10 mg by mouth daily.    Marland Kitchen losartan (COZAAR) 100 MG tablet Take 1 tablet (100 mg total) by mouth daily. 90 tablet 3  . Multiple Vitamin (MULTIVITAMIN) tablet Take 1 tablet by mouth daily.      . niacin 500 MG CR capsule Take 500 mg by mouth at bedtime.      [name] POWD by Does not apply route daily.      Marland Kitchen terazosin (HYTRIN) 10 MG capsule TAKE ONE CAPSULE BY MOUTH AT BEDTIME 30 capsule 11  . vitamin C (ASCORBIC ACID) 500 MG tablet Take 500 mg by mouth daily.       Facility-Administered Medications Prior to Visit  Medication Dose Route Frequency Provider Last Rate Last Dose  . methylPREDNISolone acetate (DEPO-MEDROL) 20 MG/ML injection 20 mg  20 mg Intra-Lesional Once Aleksei Plotnikov V, MD      . methylPREDNISolone acetate (DEPO-MEDROL) injection 40 mg  40 mg Intra-articular Once Aleksei Plotnikov V, MD      . methylPREDNISolone acetate (DEPO-MEDROL) injection 40 mg  40 mg Intra-articular Once Aleksei Plotnikov V, MD        ROS Review of Systems  Constitutional: Negative for appetite change, fatigue and unexpected weight change.  HENT: Negative for congestion, nosebleeds, sneezing, sore throat and trouble swallowing.   Eyes: Negative for itching and visual disturbance.  Respiratory: Positive for cough.   Cardiovascular:  Negative for chest pain, palpitations and leg swelling.  Gastrointestinal: Negative for nausea, diarrhea, blood in stool and abdominal distention.  Genitourinary: Negative for frequency and hematuria.  Musculoskeletal: Positive for arthralgias. Negative for back pain, joint swelling, gait problem and neck pain.  Skin: Negative for rash.  Neurological: Negative for dizziness, tremors, speech difficulty and weakness.  Psychiatric/Behavioral: Negative for sleep disturbance, dysphoric mood and agitation. The patient is not nervous/anxious.     Objective:  BP 160/70 mmHg  Pulse 68  Ht 5\' 10"  (1.778 m)  Wt 201 lb (91.173 kg)  BMI 28.84 kg/m2  SpO2 96%  BP Readings from Last 3 Encounters:  01/14/16 160/70  08/05/15 146/82  07/16/15 125/75    Wt Readings from Last 3 Encounters:  01/14/16 201 lb (91.173 kg)  08/05/15 203 lb (92.08 kg)  07/16/15 204 lb (92.534 kg)    Physical Exam  Constitutional: He is oriented to person, place, and time. He appears well-developed and well-nourished. No distress.  HENT:  Head: Normocephalic and atraumatic.  Right Ear: External ear normal.  Left Ear: External ear normal.  Nose: Nose normal.  Mouth/Throat: Oropharynx is clear and moist. No oropharyngeal exudate.  Eyes: Conjunctivae and EOM are normal. Pupils are equal, round, and reactive to light. Right eye exhibits no discharge. Left eye exhibits no  discharge. No scleral icterus.  Neck: Normal range of motion. Neck supple. No JVD present. No tracheal deviation present. No thyromegaly present.  Cardiovascular: Normal rate, regular rhythm, normal heart sounds and intact distal pulses.  Exam reveals no gallop and no friction rub.   No murmur heard. Pulmonary/Chest: Effort normal and breath sounds normal. No stridor. No respiratory distress. He has no wheezes. He has no rales. He exhibits no tenderness.  Abdominal: Soft. Bowel sounds are normal. He exhibits no distension and no mass. There is no  tenderness. There is no rebound and no guarding.  Genitourinary: Rectum normal and penis normal. Guaiac negative stool. No penile tenderness.  Musculoskeletal: Normal range of motion. He exhibits no edema or tenderness.  Lymphadenopathy:    He has no cervical adenopathy.  Neurological: He is alert and oriented to person, place, and time. He has normal reflexes. No cranial nerve deficit. He exhibits normal muscle tone. Coordination normal.  Skin: Skin is warm and dry. No rash noted. He is not diaphoretic. No erythema. No pallor.  Psychiatric: He has a normal mood and affect. His behavior is normal. Judgment and thought content normal.  prostate 1 (+)   Procedure: EKG Indication: RBBB Impression: NSR. RBBB. No new changes.  Lab Results  Component Value Date   WBC 5.0 01/10/2015   HGB 14.1 01/10/2015   HCT 41.5 01/10/2015   PLT 160.0 01/10/2015   GLUCOSE 114* 07/16/2015   CHOL 206* 01/10/2015   TRIG 244.0* 01/10/2015   HDL 32.60* 01/10/2015   LDLDIRECT 101.0 01/10/2015   ALT 14 07/16/2015   AST 14 07/16/2015   NA 143 07/16/2015   K 3.8 07/16/2015   CL 109 07/16/2015   CREATININE 1.11 07/16/2015   BUN 15 07/16/2015   CO2 28 07/16/2015   TSH 1.25 01/10/2015   PSA 4.27* 07/16/2015    Ct Chest Wo Contrast  07/15/2015  CLINICAL DATA:  Followup indeterminate pulmonary nodules. EXAM: CT CHEST WITHOUT CONTRAST TECHNIQUE: Multidetector CT imaging of the chest was performed following the standard protocol without IV contrast. COMPARISON:  12/28/2013 and 07/14/2013 FINDINGS: Mediastinum/Lymph Nodes: No masses or pathologically enlarged lymph nodes identified on this un-enhanced exam. Lungs/Pleura: Scarring is again seen with upper lobe predominance and mild traction bronchiectasis. No evidence of acute infiltrate or pleural effusion. Scattered sub-cm bilateral pulmonary nodular densities are again seen without significant change. A dominant pulmonary nodule is seen in the left upper lobe  measuring 11 x 13 mm on image 23/series 3 which is stable. No new or enlarging pulmonary nodules identified. Upper abdomen: Cholelithiasis again noted. Musculoskeletal: No chest wall mass or suspicious bone lesions identified. IMPRESSION: Stable bilateral pleural-parenchymal scarring and mild bronchiectasis with upper lung predominance. Stable bilateral pulmonary nodules, largest measuring 13 mm in the left upper lobe, consistent with benign postinflammatory etiology. Electronically Signed   By: Earle Gell M.D.   On: 07/15/2015 09:48    Assessment & Plan:   There are no diagnoses linked to this encounter. I am having Mr. Leamy maintain his GLUCOSAMINE CHONDROITIN COMPLX, multivitamin, niacin, Saw Palmetto, vitamin C, Cholecalciferol, loratadine, terazosin, losartan, and guaiFENesin. We will continue to administer methylPREDNISolone acetate, methylPREDNISolone acetate, and methylPREDNISolone acetate.  No orders of the defined types were placed in this encounter.     Follow-up: No Follow-up on file.  Walker Kehr, MD

## 2016-01-14 NOTE — Progress Notes (Signed)
Pre visit review using our clinic review tool, if applicable. No additional management support is needed unless otherwise documented below in the visit note. 

## 2016-01-14 NOTE — Assessment & Plan Note (Signed)
EKG

## 2016-01-14 NOTE — Assessment & Plan Note (Signed)
PSA

## 2016-01-14 NOTE — Assessment & Plan Note (Signed)
Here for medicare wellness/physical  Diet: heart healthy  Physical activity: not sedentary - competes in rifle shooting Depression/mood screen: negative  Hearing: ok to whispered voice  Visual acuity: grossly normal, performs annual eye exam  ADLs: capable  Fall risk: none  Home safety: good  Cognitive evaluation: intact to orientation, naming, recall and repetition  EOL planning: adv directives, full code/ I agree  I have personally reviewed and have noted  1. The patient's medical, surgical and social history  2. Their use of alcohol, tobacco or illicit drugs  3. Their current medications and supplements  4. The patient's functional ability including ADL's, fall risks, home safety risks and hearing or visual impairment.  5. Diet and physical activities  6. Evidence for depression or mood disorders 7. The roster of all physicians providing medical care to patient - is listed in the Snapshot section of the chart and reviewed today.    Today patient counseled on age appropriate routine health concerns for screening and prevention, each reviewed and up to date or declined. Immunizations reviewed and up to date or declined. Labs ordered and reviewed. Risk factors for depression reviewed and negative. Hearing function and visual acuity are intact. ADLs screened and addressed as needed. Functional ability and level of safety reviewed and appropriate. Education, counseling and referrals performed based on assessed risks today. Patient provided with a copy of personalized plan for preventive services.

## 2016-01-14 NOTE — Patient Instructions (Signed)

## 2016-01-14 NOTE — Assessment & Plan Note (Signed)
Check BP at home Call if elevated 

## 2016-02-17 DIAGNOSIS — M545 Low back pain: Secondary | ICD-10-CM | POA: Diagnosis not present

## 2016-02-17 DIAGNOSIS — M9903 Segmental and somatic dysfunction of lumbar region: Secondary | ICD-10-CM | POA: Diagnosis not present

## 2016-02-17 DIAGNOSIS — M5136 Other intervertebral disc degeneration, lumbar region: Secondary | ICD-10-CM | POA: Diagnosis not present

## 2016-02-17 DIAGNOSIS — M5441 Lumbago with sciatica, right side: Secondary | ICD-10-CM | POA: Diagnosis not present

## 2016-02-20 DIAGNOSIS — M545 Low back pain: Secondary | ICD-10-CM | POA: Diagnosis not present

## 2016-02-20 DIAGNOSIS — M9903 Segmental and somatic dysfunction of lumbar region: Secondary | ICD-10-CM | POA: Diagnosis not present

## 2016-02-20 DIAGNOSIS — M5136 Other intervertebral disc degeneration, lumbar region: Secondary | ICD-10-CM | POA: Diagnosis not present

## 2016-02-20 DIAGNOSIS — M5441 Lumbago with sciatica, right side: Secondary | ICD-10-CM | POA: Diagnosis not present

## 2016-03-03 DIAGNOSIS — M545 Low back pain: Secondary | ICD-10-CM | POA: Diagnosis not present

## 2016-03-03 DIAGNOSIS — M5136 Other intervertebral disc degeneration, lumbar region: Secondary | ICD-10-CM | POA: Diagnosis not present

## 2016-03-03 DIAGNOSIS — M9903 Segmental and somatic dysfunction of lumbar region: Secondary | ICD-10-CM | POA: Diagnosis not present

## 2016-03-03 DIAGNOSIS — M5441 Lumbago with sciatica, right side: Secondary | ICD-10-CM | POA: Diagnosis not present

## 2016-03-17 DIAGNOSIS — M5136 Other intervertebral disc degeneration, lumbar region: Secondary | ICD-10-CM | POA: Diagnosis not present

## 2016-03-17 DIAGNOSIS — M545 Low back pain: Secondary | ICD-10-CM | POA: Diagnosis not present

## 2016-03-17 DIAGNOSIS — M5441 Lumbago with sciatica, right side: Secondary | ICD-10-CM | POA: Diagnosis not present

## 2016-03-17 DIAGNOSIS — M9903 Segmental and somatic dysfunction of lumbar region: Secondary | ICD-10-CM | POA: Diagnosis not present

## 2016-03-22 ENCOUNTER — Other Ambulatory Visit: Payer: Self-pay | Admitting: Internal Medicine

## 2016-03-31 DIAGNOSIS — M5441 Lumbago with sciatica, right side: Secondary | ICD-10-CM | POA: Diagnosis not present

## 2016-03-31 DIAGNOSIS — M9903 Segmental and somatic dysfunction of lumbar region: Secondary | ICD-10-CM | POA: Diagnosis not present

## 2016-03-31 DIAGNOSIS — M5136 Other intervertebral disc degeneration, lumbar region: Secondary | ICD-10-CM | POA: Diagnosis not present

## 2016-03-31 DIAGNOSIS — M545 Low back pain: Secondary | ICD-10-CM | POA: Diagnosis not present

## 2016-04-02 DIAGNOSIS — S335XXA Sprain of ligaments of lumbar spine, initial encounter: Secondary | ICD-10-CM | POA: Diagnosis not present

## 2016-04-21 DIAGNOSIS — M5136 Other intervertebral disc degeneration, lumbar region: Secondary | ICD-10-CM | POA: Diagnosis not present

## 2016-04-21 DIAGNOSIS — M9903 Segmental and somatic dysfunction of lumbar region: Secondary | ICD-10-CM | POA: Diagnosis not present

## 2016-04-21 DIAGNOSIS — M5441 Lumbago with sciatica, right side: Secondary | ICD-10-CM | POA: Diagnosis not present

## 2016-04-21 DIAGNOSIS — M545 Low back pain: Secondary | ICD-10-CM | POA: Diagnosis not present

## 2016-04-30 ENCOUNTER — Encounter: Payer: Self-pay | Admitting: Internal Medicine

## 2016-04-30 DIAGNOSIS — Z85828 Personal history of other malignant neoplasm of skin: Secondary | ICD-10-CM | POA: Diagnosis not present

## 2016-04-30 DIAGNOSIS — L308 Other specified dermatitis: Secondary | ICD-10-CM | POA: Diagnosis not present

## 2016-04-30 DIAGNOSIS — D1801 Hemangioma of skin and subcutaneous tissue: Secondary | ICD-10-CM | POA: Diagnosis not present

## 2016-04-30 DIAGNOSIS — L570 Actinic keratosis: Secondary | ICD-10-CM | POA: Diagnosis not present

## 2016-04-30 DIAGNOSIS — D692 Other nonthrombocytopenic purpura: Secondary | ICD-10-CM | POA: Diagnosis not present

## 2016-04-30 DIAGNOSIS — L821 Other seborrheic keratosis: Secondary | ICD-10-CM | POA: Diagnosis not present

## 2016-04-30 DIAGNOSIS — L98499 Non-pressure chronic ulcer of skin of other sites with unspecified severity: Secondary | ICD-10-CM | POA: Diagnosis not present

## 2016-05-12 DIAGNOSIS — M5441 Lumbago with sciatica, right side: Secondary | ICD-10-CM | POA: Diagnosis not present

## 2016-05-12 DIAGNOSIS — M5136 Other intervertebral disc degeneration, lumbar region: Secondary | ICD-10-CM | POA: Diagnosis not present

## 2016-05-12 DIAGNOSIS — M545 Low back pain: Secondary | ICD-10-CM | POA: Diagnosis not present

## 2016-05-12 DIAGNOSIS — M9903 Segmental and somatic dysfunction of lumbar region: Secondary | ICD-10-CM | POA: Diagnosis not present

## 2016-05-26 DIAGNOSIS — M47812 Spondylosis without myelopathy or radiculopathy, cervical region: Secondary | ICD-10-CM | POA: Diagnosis not present

## 2016-05-26 DIAGNOSIS — M9901 Segmental and somatic dysfunction of cervical region: Secondary | ICD-10-CM | POA: Diagnosis not present

## 2016-05-26 DIAGNOSIS — M5030 Other cervical disc degeneration, unspecified cervical region: Secondary | ICD-10-CM | POA: Diagnosis not present

## 2016-05-26 DIAGNOSIS — M542 Cervicalgia: Secondary | ICD-10-CM | POA: Diagnosis not present

## 2016-06-09 DIAGNOSIS — M542 Cervicalgia: Secondary | ICD-10-CM | POA: Diagnosis not present

## 2016-06-09 DIAGNOSIS — M47812 Spondylosis without myelopathy or radiculopathy, cervical region: Secondary | ICD-10-CM | POA: Diagnosis not present

## 2016-06-09 DIAGNOSIS — M9901 Segmental and somatic dysfunction of cervical region: Secondary | ICD-10-CM | POA: Diagnosis not present

## 2016-06-09 DIAGNOSIS — M5030 Other cervical disc degeneration, unspecified cervical region: Secondary | ICD-10-CM | POA: Diagnosis not present

## 2016-06-23 DIAGNOSIS — M5030 Other cervical disc degeneration, unspecified cervical region: Secondary | ICD-10-CM | POA: Diagnosis not present

## 2016-06-23 DIAGNOSIS — M47812 Spondylosis without myelopathy or radiculopathy, cervical region: Secondary | ICD-10-CM | POA: Diagnosis not present

## 2016-06-23 DIAGNOSIS — M542 Cervicalgia: Secondary | ICD-10-CM | POA: Diagnosis not present

## 2016-06-23 DIAGNOSIS — M9901 Segmental and somatic dysfunction of cervical region: Secondary | ICD-10-CM | POA: Diagnosis not present

## 2016-07-07 DIAGNOSIS — M9901 Segmental and somatic dysfunction of cervical region: Secondary | ICD-10-CM | POA: Diagnosis not present

## 2016-07-07 DIAGNOSIS — M5030 Other cervical disc degeneration, unspecified cervical region: Secondary | ICD-10-CM | POA: Diagnosis not present

## 2016-07-07 DIAGNOSIS — M47812 Spondylosis without myelopathy or radiculopathy, cervical region: Secondary | ICD-10-CM | POA: Diagnosis not present

## 2016-07-07 DIAGNOSIS — M542 Cervicalgia: Secondary | ICD-10-CM | POA: Diagnosis not present

## 2016-07-16 ENCOUNTER — Encounter: Payer: Self-pay | Admitting: Internal Medicine

## 2016-07-16 ENCOUNTER — Ambulatory Visit (INDEPENDENT_AMBULATORY_CARE_PROVIDER_SITE_OTHER): Payer: Medicare Other | Admitting: Internal Medicine

## 2016-07-16 ENCOUNTER — Other Ambulatory Visit (INDEPENDENT_AMBULATORY_CARE_PROVIDER_SITE_OTHER): Payer: Medicare Other

## 2016-07-16 VITALS — BP 146/70 | HR 70 | Wt 204.0 lb

## 2016-07-16 DIAGNOSIS — I10 Essential (primary) hypertension: Secondary | ICD-10-CM | POA: Diagnosis not present

## 2016-07-16 DIAGNOSIS — J479 Bronchiectasis, uncomplicated: Secondary | ICD-10-CM | POA: Diagnosis not present

## 2016-07-16 DIAGNOSIS — R972 Elevated prostate specific antigen [PSA]: Secondary | ICD-10-CM

## 2016-07-16 DIAGNOSIS — E785 Hyperlipidemia, unspecified: Secondary | ICD-10-CM | POA: Diagnosis not present

## 2016-07-16 LAB — BASIC METABOLIC PANEL
BUN: 14 mg/dL (ref 6–23)
CALCIUM: 9.7 mg/dL (ref 8.4–10.5)
CO2: 29 meq/L (ref 19–32)
CREATININE: 1.19 mg/dL (ref 0.40–1.50)
Chloride: 108 mEq/L (ref 96–112)
GFR: 62.35 mL/min (ref >60.00)
Glucose, Bld: 92 mg/dL (ref 70–99)
Potassium: 4.1 mEq/L (ref 3.5–5.1)
Sodium: 143 mEq/L (ref 135–145)

## 2016-07-16 LAB — PSA: PSA: 5.7 ng/mL — ABNORMAL HIGH (ref 0.10–4.00)

## 2016-07-16 NOTE — Progress Notes (Signed)
Subjective:  Patient ID: Curtis Jacobs, male    DOB: May 31, 1933  Age: 82 y.o. MRN: [id number]  CC: No chief complaint on file.   HPI Curtis Jacobs presents for HTN, chronic cough, BPH, LBP f/u  Outpatient Medications Prior to Visit  Medication Sig Dispense Refill  . Cholecalciferol 1000 UNITS tablet Take 2,000 Units by mouth daily.     . Glucosamine-Chondroit-Vit C-Mn (GLUCOSAMINE CHONDROITIN COMPLX) CAPS Take 1 capsule by mouth daily.      Marland Kitchen guaiFENesin (MUCINEX) 600 MG 12 hr tablet Take 2 tablets (1,200 mg total) by mouth 2 (two) times daily.    Marland Kitchen loratadine (CLARITIN) 10 MG tablet Take 10 mg by mouth daily.    Marland Kitchen losartan (COZAAR) 100 MG tablet Take 1 tablet (100 mg total) by mouth daily. 90 tablet 3  . Multiple Vitamin (MULTIVITAMIN) tablet Take 1 tablet by mouth daily.      . niacin 500 MG CR capsule Take 500 mg by mouth at bedtime.      Curtis Jacobs POWD by Does not apply route daily.      Marland Kitchen terazosin (HYTRIN) 10 MG capsule TAKE ONE CAPSULE BY MOUTH AT BEDTIME 30 capsule 11  . vitamin C (ASCORBIC ACID) 500 MG tablet Take 500 mg by mouth daily.       Facility-Administered Medications Prior to Visit  Medication Dose Route Frequency Provider Last Rate Last Dose  . methylPREDNISolone acetate (DEPO-MEDROL) 20 MG/ML injection 20 mg  20 mg Intra-Lesional Once Curtis Garter, MD      . methylPREDNISolone acetate (DEPO-MEDROL) injection 40 mg  40 mg Intra-articular Once Curtis Quint Dezmen Alcock, MD      . methylPREDNISolone acetate (DEPO-MEDROL) injection 40 mg  40 mg Intra-articular Once Curtis Garter, MD        ROS Review of Systems  Constitutional: Negative for appetite change, fatigue and unexpected weight change.  HENT: Negative for congestion, nosebleeds, sneezing, sore throat and trouble swallowing.   Eyes: Negative for itching and visual disturbance.  Respiratory: Positive for cough.   Cardiovascular: Negative for chest pain, palpitations and leg swelling.    Gastrointestinal: Negative for abdominal distention, blood in stool, diarrhea and nausea.  Genitourinary: Negative for frequency and hematuria.  Musculoskeletal: Positive for arthralgias and back pain. Negative for gait problem, joint swelling and neck pain.  Skin: Negative for rash.  Neurological: Negative for dizziness, tremors, speech difficulty and weakness.  Psychiatric/Behavioral: Negative for agitation, dysphoric mood and sleep disturbance. The patient is not nervous/anxious.     Objective:  BP (!) 146/70   Pulse 70   Wt 204 lb (92.5 kg)   SpO2 98%   BMI 29.27 kg/m   BP Readings from Last 3 Encounters:  07/16/16 (!) 146/70  01/14/16 (!) 160/70  08/05/15 (!) 146/82    Wt Readings from Last 3 Encounters:  07/16/16 204 lb (92.5 kg)  01/14/16 201 lb (91.2 kg)  08/05/15 203 lb (92.1 kg)    Physical Exam  Constitutional: He is oriented to person, place, and time. He appears well-developed. No distress.  NAD  HENT:  Mouth/Throat: Oropharynx is clear and moist.  Eyes: Conjunctivae are normal. Pupils are equal, round, and reactive to light.  Neck: Normal range of motion. No JVD present. No thyromegaly present.  Cardiovascular: Normal rate, regular rhythm, normal heart sounds and intact distal pulses.  Exam reveals no gallop and no friction rub.   No murmur heard. Pulmonary/Chest: Effort normal and breath sounds normal. No respiratory  distress. He has no wheezes. He has no rales. He exhibits no tenderness.  Abdominal: Soft. Bowel sounds are normal. He exhibits no distension and no mass. There is no tenderness. There is no rebound and no guarding.  Musculoskeletal: Normal range of motion. He exhibits tenderness. He exhibits no edema.  Lymphadenopathy:    He has no cervical adenopathy.  Neurological: He is alert and oriented to person, place, and time. He has normal reflexes. No cranial nerve deficit. He exhibits normal muscle tone. He displays a negative Romberg sign.  Coordination and gait normal.  Skin: Skin is warm and dry. Rash noted.  Psychiatric: He has a normal mood and affect. His behavior is normal. Judgment and thought content normal.  LS tender rash  Lab Results  Component Value Date   WBC 5.0 01/14/2016   HGB 14.0 01/14/2016   HCT 40.7 01/14/2016   PLT 172.0 01/14/2016   GLUCOSE 91 01/14/2016   CHOL 199 01/14/2016   TRIG 155.0 (H) 01/14/2016   HDL 35.10 (L) 01/14/2016   LDLDIRECT 101.0 01/10/2015   LDLCALC 133 (H) 01/14/2016   ALT 16 01/14/2016   AST 17 01/14/2016   NA 141 01/14/2016   K 4.2 01/14/2016   CL 111 01/14/2016   CREATININE 1.10 01/14/2016   BUN 15 01/14/2016   CO2 27 01/14/2016   TSH 1.42 01/14/2016   PSA 3.76 01/14/2016    Ct Chest Wo Contrast  Result Date: 07/15/2015 CLINICAL DATA:  Followup indeterminate pulmonary nodules. EXAM: CT CHEST WITHOUT CONTRAST TECHNIQUE: Multidetector CT imaging of the chest was performed following the standard protocol without IV contrast. COMPARISON:  12/28/2013 and 07/14/2013 FINDINGS: Mediastinum/Lymph Nodes: No masses or pathologically enlarged lymph nodes identified on this un-enhanced exam. Lungs/Pleura: Scarring is again seen with upper lobe predominance and mild traction bronchiectasis. No evidence of acute infiltrate or pleural effusion. Scattered sub-cm bilateral pulmonary nodular densities are again seen without significant change. A dominant pulmonary nodule is seen in the left upper lobe measuring 11 x 13 mm on image 23/series 3 which is stable. No new or enlarging pulmonary nodules identified. Upper abdomen: Cholelithiasis again noted. Musculoskeletal: No chest wall mass or suspicious bone lesions identified. IMPRESSION: Stable bilateral pleural-parenchymal scarring and mild bronchiectasis with upper lung predominance. Stable bilateral pulmonary nodules, largest measuring 13 mm in the left upper lobe, consistent with benign postinflammatory etiology. Electronically Signed   By:  Curtis Jacobs M.D.   On: 07/15/2015 09:48    Assessment & Plan:   There are no diagnoses linked to this encounter. I am having [name] maintain his GLUCOSAMINE CHONDROITIN COMPLX, multivitamin, niacin, Saw Jacobs, vitamin C, Cholecalciferol, loratadine, losartan, guaiFENesin, terazosin, and fluocinonide cream. We will continue to administer methylPREDNISolone acetate, methylPREDNISolone acetate, and methylPREDNISolone acetate.  Meds ordered this encounter  Medications  . fluocinonide cream (LIDEX) 0.05 %    Sig: Apply 1 application topically daily.     Follow-up: No Follow-up on file.  Sonda Primes, MD

## 2016-07-16 NOTE — Assessment & Plan Note (Signed)
F/u w/Dr Lake Bells

## 2016-07-16 NOTE — Assessment & Plan Note (Signed)
Monitor PSAs

## 2016-07-16 NOTE — Assessment & Plan Note (Signed)
Declined statins. 

## 2016-07-16 NOTE — Progress Notes (Signed)
Pre visit review using our clinic review tool, if applicable. No additional management support is needed unless otherwise documented below in the visit note. 

## 2016-07-16 NOTE — Assessment & Plan Note (Signed)
Hytrin Losartan

## 2016-07-21 DIAGNOSIS — M9901 Segmental and somatic dysfunction of cervical region: Secondary | ICD-10-CM | POA: Diagnosis not present

## 2016-07-21 DIAGNOSIS — M542 Cervicalgia: Secondary | ICD-10-CM | POA: Diagnosis not present

## 2016-07-21 DIAGNOSIS — M5030 Other cervical disc degeneration, unspecified cervical region: Secondary | ICD-10-CM | POA: Diagnosis not present

## 2016-07-21 DIAGNOSIS — M47812 Spondylosis without myelopathy or radiculopathy, cervical region: Secondary | ICD-10-CM | POA: Diagnosis not present

## 2016-08-04 ENCOUNTER — Ambulatory Visit (INDEPENDENT_AMBULATORY_CARE_PROVIDER_SITE_OTHER): Payer: Medicare Other | Admitting: Pulmonary Disease

## 2016-08-04 ENCOUNTER — Encounter: Payer: Self-pay | Admitting: Pulmonary Disease

## 2016-08-04 DIAGNOSIS — J479 Bronchiectasis, uncomplicated: Secondary | ICD-10-CM | POA: Diagnosis not present

## 2016-08-04 NOTE — Assessment & Plan Note (Signed)
No exacerbations in the last year. Today we reviewed the importance of keeping his immunizations up-to-date (which they are) and hand hygiene. He performs mucociliary clearance methods with intentional cough in the mornings which she augments his mucus clearance with an expectorant (guaifenesin) sees. I recommended that he continue with this strategy. Follow-up with me in one year or sooner if needed

## 2016-08-04 NOTE — Progress Notes (Signed)
Subjective:    Patient ID: Curtis Jacobs, male    DOB: 1933-08-13, 82 y.o.   MRN: [id number]  Synopsis: Mr. Curtis Jacobs has bronchiectasis (post infectious) and a pulmonary nodule.  He had normal spirometry on the first visit with [facility] in 2014.  His bronchiectasis is likely from a bad pneumonia in the 1970's.   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    HPI Chief Complaint  Patient presents with  . Follow-up    pt states he is at baseline- SOB with exertion, sometimes prod cough worse qam.     [name] says he has been doing OK for the last year. He says that he always has some dyspnea with a chronic cough which he describes as a tickle in the mornings.  He says that he will try extra hard to cough up mucus in the mornings.  He says that he takes guaifenesin 600 mg twice a day.    Past Medical History:  Diagnosis Date  . BPH (benign prostatic hypertrophy)    Dr. Risa Grill  . HA (headache)    Traction- Dr. Jannifer Franklin- had MRI.MRA  . HTN (hypertension)   . Hyperlipidemia      Review of Systems  Constitutional: Negative for chills, fatigue and fever.  HENT: Negative for postnasal drip, rhinorrhea and sinus pressure.   Respiratory: Positive for cough. Negative for shortness of breath and wheezing.   Cardiovascular: Negative for chest pain, palpitations and leg swelling.       Objective:   Physical Exam  Vitals:   08/04/16 1004  BP: 138/76  BP Location: Left Arm  Cuff Size: Normal  Pulse: 76  SpO2: 98%  Weight: 209 lb (94.8 kg)  Height: 5\' 10"  (1.778 m)  RA  Gen: well appearing HEENT: NCAT, PERRL, EOMi PULM: CTA B today CV: RRR, no mgr AB: BS+ soft, nontender Ext: warm, no edema  06/2015 CT chest images reviewed again 08/04/2016 in clinid > no change in nodules, round solid nodules left upper lobe, bronchiectasis in the RUL predominant      Assessment & Plan:   Bronchiectasis without acute exacerbation (HCC) No exacerbations in the last year.  Today we reviewed the importance of keeping his immunizations up-to-date (which they are) and hand hygiene. He performs mucociliary clearance methods with intentional cough in the mornings which she augments his mucus clearance with an expectorant (guaifenesin) sees. I recommended that he continue with this strategy. Follow-up with me in one year or sooner if needed   Updated Medication List Outpatient Encounter Prescriptions as of 08/04/2016  Medication Sig  . Cholecalciferol 1000 UNITS tablet Take 2,000 Units by mouth daily.   . fluocinonide cream (LIDEX) [id number] % Apply 1 application topically daily.  . Glucosamine-Chondroit-Vit C-Mn (GLUCOSAMINE CHONDROITIN COMPLX) CAPS Take 1 capsule by mouth daily.    Marland Kitchen guaiFENesin (MUCINEX) 600 MG 12 hr tablet Take 2 tablets (1,200 mg total) by mouth 2 (two) times daily.  Marland Kitchen loratadine (CLARITIN) 10 MG tablet Take 10 mg by mouth daily.  Marland Kitchen losartan (COZAAR) 100 MG tablet Take 1 tablet (100 mg total) by mouth daily.  . Multiple Vitamin (MULTIVITAMIN) tablet Take 1 tablet by mouth daily.    . niacin 500 MG CR capsule Take 500 mg by mouth at bedtime.    [name] POWD Take by mouth daily.   Marland Kitchen terazosin (HYTRIN) 10 MG capsule TAKE ONE CAPSULE BY MOUTH AT BEDTIME  . vitamin C (ASCORBIC ACID) 500 MG tablet  Take 500 mg by mouth daily.     Facility-Administered Encounter Medications as of 08/04/2016  Medication  . methylPREDNISolone acetate (DEPO-MEDROL) 20 MG/ML injection 20 mg  . methylPREDNISolone acetate (DEPO-MEDROL) injection 40 mg  . methylPREDNISolone acetate (DEPO-MEDROL) injection 40 mg

## 2016-08-04 NOTE — Patient Instructions (Signed)
Keep doing what you are doing: Intentional cough every morning, taking Mucinex as you're doing and keeping your hands clean when out in public We will see you back in one year or sooner if needed

## 2016-08-06 DIAGNOSIS — M9901 Segmental and somatic dysfunction of cervical region: Secondary | ICD-10-CM | POA: Diagnosis not present

## 2016-08-06 DIAGNOSIS — M5030 Other cervical disc degeneration, unspecified cervical region: Secondary | ICD-10-CM | POA: Diagnosis not present

## 2016-08-06 DIAGNOSIS — M542 Cervicalgia: Secondary | ICD-10-CM | POA: Diagnosis not present

## 2016-08-06 DIAGNOSIS — M47812 Spondylosis without myelopathy or radiculopathy, cervical region: Secondary | ICD-10-CM | POA: Diagnosis not present

## 2016-08-12 DIAGNOSIS — M5030 Other cervical disc degeneration, unspecified cervical region: Secondary | ICD-10-CM | POA: Diagnosis not present

## 2016-08-12 DIAGNOSIS — M542 Cervicalgia: Secondary | ICD-10-CM | POA: Diagnosis not present

## 2016-08-12 DIAGNOSIS — M47812 Spondylosis without myelopathy or radiculopathy, cervical region: Secondary | ICD-10-CM | POA: Diagnosis not present

## 2016-08-12 DIAGNOSIS — M9901 Segmental and somatic dysfunction of cervical region: Secondary | ICD-10-CM | POA: Diagnosis not present

## 2016-08-18 ENCOUNTER — Other Ambulatory Visit: Payer: Self-pay | Admitting: Internal Medicine

## 2016-08-24 DIAGNOSIS — M47816 Spondylosis without myelopathy or radiculopathy, lumbar region: Secondary | ICD-10-CM | POA: Diagnosis not present

## 2016-08-24 DIAGNOSIS — M545 Low back pain: Secondary | ICD-10-CM | POA: Diagnosis not present

## 2016-08-24 DIAGNOSIS — M5136 Other intervertebral disc degeneration, lumbar region: Secondary | ICD-10-CM | POA: Diagnosis not present

## 2016-08-24 DIAGNOSIS — M9903 Segmental and somatic dysfunction of lumbar region: Secondary | ICD-10-CM | POA: Diagnosis not present

## 2016-09-07 DIAGNOSIS — M47816 Spondylosis without myelopathy or radiculopathy, lumbar region: Secondary | ICD-10-CM | POA: Diagnosis not present

## 2016-09-07 DIAGNOSIS — M545 Low back pain: Secondary | ICD-10-CM | POA: Diagnosis not present

## 2016-09-07 DIAGNOSIS — M9903 Segmental and somatic dysfunction of lumbar region: Secondary | ICD-10-CM | POA: Diagnosis not present

## 2016-09-07 DIAGNOSIS — M5136 Other intervertebral disc degeneration, lumbar region: Secondary | ICD-10-CM | POA: Diagnosis not present

## 2016-09-21 DIAGNOSIS — M9903 Segmental and somatic dysfunction of lumbar region: Secondary | ICD-10-CM | POA: Diagnosis not present

## 2016-09-21 DIAGNOSIS — M545 Low back pain: Secondary | ICD-10-CM | POA: Diagnosis not present

## 2016-09-21 DIAGNOSIS — M47816 Spondylosis without myelopathy or radiculopathy, lumbar region: Secondary | ICD-10-CM | POA: Diagnosis not present

## 2016-09-21 DIAGNOSIS — M5136 Other intervertebral disc degeneration, lumbar region: Secondary | ICD-10-CM | POA: Diagnosis not present

## 2016-10-05 DIAGNOSIS — M545 Low back pain: Secondary | ICD-10-CM | POA: Diagnosis not present

## 2016-10-05 DIAGNOSIS — M9903 Segmental and somatic dysfunction of lumbar region: Secondary | ICD-10-CM | POA: Diagnosis not present

## 2016-10-05 DIAGNOSIS — M47816 Spondylosis without myelopathy or radiculopathy, lumbar region: Secondary | ICD-10-CM | POA: Diagnosis not present

## 2016-10-05 DIAGNOSIS — M5136 Other intervertebral disc degeneration, lumbar region: Secondary | ICD-10-CM | POA: Diagnosis not present

## 2016-10-19 DIAGNOSIS — M47816 Spondylosis without myelopathy or radiculopathy, lumbar region: Secondary | ICD-10-CM | POA: Diagnosis not present

## 2016-10-19 DIAGNOSIS — M9903 Segmental and somatic dysfunction of lumbar region: Secondary | ICD-10-CM | POA: Diagnosis not present

## 2016-10-19 DIAGNOSIS — M545 Low back pain: Secondary | ICD-10-CM | POA: Diagnosis not present

## 2016-10-19 DIAGNOSIS — M5136 Other intervertebral disc degeneration, lumbar region: Secondary | ICD-10-CM | POA: Diagnosis not present

## 2016-11-02 DIAGNOSIS — M9903 Segmental and somatic dysfunction of lumbar region: Secondary | ICD-10-CM | POA: Diagnosis not present

## 2016-11-02 DIAGNOSIS — M5136 Other intervertebral disc degeneration, lumbar region: Secondary | ICD-10-CM | POA: Diagnosis not present

## 2016-11-02 DIAGNOSIS — M47816 Spondylosis without myelopathy or radiculopathy, lumbar region: Secondary | ICD-10-CM | POA: Diagnosis not present

## 2016-11-02 DIAGNOSIS — M545 Low back pain: Secondary | ICD-10-CM | POA: Diagnosis not present

## 2016-11-03 DIAGNOSIS — D2271 Melanocytic nevi of right lower limb, including hip: Secondary | ICD-10-CM | POA: Diagnosis not present

## 2016-11-03 DIAGNOSIS — D2272 Melanocytic nevi of left lower limb, including hip: Secondary | ICD-10-CM | POA: Diagnosis not present

## 2016-11-03 DIAGNOSIS — L570 Actinic keratosis: Secondary | ICD-10-CM | POA: Diagnosis not present

## 2016-11-03 DIAGNOSIS — D1801 Hemangioma of skin and subcutaneous tissue: Secondary | ICD-10-CM | POA: Diagnosis not present

## 2016-11-03 DIAGNOSIS — Z85828 Personal history of other malignant neoplasm of skin: Secondary | ICD-10-CM | POA: Diagnosis not present

## 2016-11-03 DIAGNOSIS — L821 Other seborrheic keratosis: Secondary | ICD-10-CM | POA: Diagnosis not present

## 2016-11-16 DIAGNOSIS — M9903 Segmental and somatic dysfunction of lumbar region: Secondary | ICD-10-CM | POA: Diagnosis not present

## 2016-11-16 DIAGNOSIS — M545 Low back pain: Secondary | ICD-10-CM | POA: Diagnosis not present

## 2016-11-16 DIAGNOSIS — M47816 Spondylosis without myelopathy or radiculopathy, lumbar region: Secondary | ICD-10-CM | POA: Diagnosis not present

## 2016-11-16 DIAGNOSIS — M5136 Other intervertebral disc degeneration, lumbar region: Secondary | ICD-10-CM | POA: Diagnosis not present

## 2016-11-30 DIAGNOSIS — M9903 Segmental and somatic dysfunction of lumbar region: Secondary | ICD-10-CM | POA: Diagnosis not present

## 2016-11-30 DIAGNOSIS — M545 Low back pain: Secondary | ICD-10-CM | POA: Diagnosis not present

## 2016-11-30 DIAGNOSIS — M47816 Spondylosis without myelopathy or radiculopathy, lumbar region: Secondary | ICD-10-CM | POA: Diagnosis not present

## 2016-11-30 DIAGNOSIS — M5136 Other intervertebral disc degeneration, lumbar region: Secondary | ICD-10-CM | POA: Diagnosis not present

## 2016-12-14 DIAGNOSIS — M545 Low back pain: Secondary | ICD-10-CM | POA: Diagnosis not present

## 2016-12-14 DIAGNOSIS — M9903 Segmental and somatic dysfunction of lumbar region: Secondary | ICD-10-CM | POA: Diagnosis not present

## 2016-12-14 DIAGNOSIS — M5136 Other intervertebral disc degeneration, lumbar region: Secondary | ICD-10-CM | POA: Diagnosis not present

## 2016-12-14 DIAGNOSIS — M47816 Spondylosis without myelopathy or radiculopathy, lumbar region: Secondary | ICD-10-CM | POA: Diagnosis not present

## 2016-12-28 DIAGNOSIS — M9903 Segmental and somatic dysfunction of lumbar region: Secondary | ICD-10-CM | POA: Diagnosis not present

## 2016-12-28 DIAGNOSIS — M47816 Spondylosis without myelopathy or radiculopathy, lumbar region: Secondary | ICD-10-CM | POA: Diagnosis not present

## 2016-12-28 DIAGNOSIS — M5136 Other intervertebral disc degeneration, lumbar region: Secondary | ICD-10-CM | POA: Diagnosis not present

## 2016-12-28 DIAGNOSIS — M545 Low back pain: Secondary | ICD-10-CM | POA: Diagnosis not present

## 2017-01-12 DIAGNOSIS — M5136 Other intervertebral disc degeneration, lumbar region: Secondary | ICD-10-CM | POA: Diagnosis not present

## 2017-01-12 DIAGNOSIS — M9903 Segmental and somatic dysfunction of lumbar region: Secondary | ICD-10-CM | POA: Diagnosis not present

## 2017-01-12 DIAGNOSIS — M47816 Spondylosis without myelopathy or radiculopathy, lumbar region: Secondary | ICD-10-CM | POA: Diagnosis not present

## 2017-01-12 DIAGNOSIS — M545 Low back pain: Secondary | ICD-10-CM | POA: Diagnosis not present

## 2017-01-14 ENCOUNTER — Ambulatory Visit (INDEPENDENT_AMBULATORY_CARE_PROVIDER_SITE_OTHER): Payer: Medicare Other | Admitting: Internal Medicine

## 2017-01-14 ENCOUNTER — Encounter: Payer: Self-pay | Admitting: Internal Medicine

## 2017-01-14 ENCOUNTER — Other Ambulatory Visit (INDEPENDENT_AMBULATORY_CARE_PROVIDER_SITE_OTHER): Payer: Medicare Other

## 2017-01-14 VITALS — BP 130/82 | HR 58 | Temp 97.7°F | Ht 70.0 in | Wt 201.0 lb

## 2017-01-14 DIAGNOSIS — I4510 Unspecified right bundle-branch block: Secondary | ICD-10-CM | POA: Diagnosis not present

## 2017-01-14 DIAGNOSIS — Z0000 Encounter for general adult medical examination without abnormal findings: Secondary | ICD-10-CM | POA: Diagnosis not present

## 2017-01-14 DIAGNOSIS — I10 Essential (primary) hypertension: Secondary | ICD-10-CM

## 2017-01-14 LAB — BASIC METABOLIC PANEL
BUN: 16 mg/dL (ref 6–23)
CALCIUM: 9.5 mg/dL (ref 8.4–10.5)
CO2: 28 meq/L (ref 19–32)
CREATININE: 1.27 mg/dL (ref 0.40–1.50)
Chloride: 110 mEq/L (ref 96–112)
GFR: 57.77 mL/min — ABNORMAL LOW (ref >60.00)
Glucose, Bld: 94 mg/dL (ref 70–99)
Potassium: 4.2 mEq/L (ref 3.5–5.1)
Sodium: 141 mEq/L (ref 135–145)

## 2017-01-14 LAB — CBC WITH DIFFERENTIAL/PLATELET
Basophils Absolute: 0.1 10*3/uL (ref 0.0–0.1)
Basophils Relative: 1.2 % (ref 0.0–3.0)
EOS ABS: 0.1 10*3/uL (ref 0.0–0.7)
Eosinophils Relative: 2.3 % (ref 0.0–5.0)
HCT: 41.7 % (ref 39.0–52.0)
Hemoglobin: 14.1 g/dL (ref 13.0–17.0)
LYMPHS ABS: 1.6 10*3/uL (ref 0.7–4.0)
LYMPHS PCT: 29.6 % (ref 12.0–46.0)
MCHC: 33.8 g/dL (ref 30.0–36.0)
MCV: 92.1 fl (ref 78.0–100.0)
MONO ABS: 0.4 10*3/uL (ref 0.1–1.0)
Monocytes Relative: 8.4 % (ref 3.0–12.0)
NEUTROS ABS: 3.1 10*3/uL (ref 1.4–7.7)
NEUTROS PCT: 58.5 % (ref 43.0–77.0)
PLATELETS: 170 10*3/uL (ref 150.0–400.0)
RBC: 4.53 Mil/uL (ref 4.22–5.81)
RDW: 13.7 % (ref 11.5–15.5)
WBC: 5.3 10*3/uL (ref 4.0–10.5)

## 2017-01-14 LAB — URINALYSIS
Bilirubin Urine: NEGATIVE
Hgb urine dipstick: NEGATIVE
Ketones, ur: NEGATIVE
Leukocytes, UA: NEGATIVE
NITRITE: NEGATIVE
SPECIFIC GRAVITY, URINE: 1.025 (ref 1.000–1.030)
TOTAL PROTEIN, URINE-UPE24: NEGATIVE
URINE GLUCOSE: NEGATIVE
Urobilinogen, UA: 0.2 (ref 0.0–1.0)
pH: 6 (ref 5.0–8.0)

## 2017-01-14 LAB — LIPID PANEL
CHOL/HDL RATIO: 6
Cholesterol: 201 mg/dL — ABNORMAL HIGH (ref 0–200)
HDL: 35 mg/dL — AB (ref >39.00)
LDL Cholesterol: 133 mg/dL — ABNORMAL HIGH (ref 0–99)
NONHDL: 166.06
TRIGLYCERIDES: 163 mg/dL — AB (ref 0.0–149.0)
VLDL: 32.6 mg/dL (ref 0.0–40.0)

## 2017-01-14 LAB — HEPATIC FUNCTION PANEL
ALT: 16 U/L (ref 0–53)
AST: 15 U/L (ref 0–37)
Albumin: 4.1 g/dL (ref 3.5–5.2)
Alkaline Phosphatase: 118 U/L — ABNORMAL HIGH (ref 39–117)
BILIRUBIN DIRECT: 0.1 mg/dL (ref 0.0–0.3)
BILIRUBIN TOTAL: 0.7 mg/dL (ref 0.2–1.2)
Total Protein: 6.7 g/dL (ref 6.0–8.3)

## 2017-01-14 LAB — PSA: PSA: 3.51 ng/mL (ref 0.10–4.00)

## 2017-01-14 LAB — TSH: TSH: 2.16 u[IU]/mL (ref 0.35–4.50)

## 2017-01-14 NOTE — Assessment & Plan Note (Signed)
Losartan, Hytrin

## 2017-01-14 NOTE — Assessment & Plan Note (Signed)
Here for medicare wellness/physical  Diet: heart healthy  Physical activity: not sedentary  Depression/mood screen: negative  Hearing: decreased a little to whispered voice  Visual acuity: grossly normal w/glasses, performs annual eye exam  ADLs: capable  Fall risk: low to none  Home safety: good  Cognitive evaluation: intact to orientation, naming, recall and repetition  EOL planning: adv directives, full code/ I agree  I have personally reviewed and have noted  1. The patient's medical, surgical and social history  2. Their use of alcohol, tobacco or illicit drugs  3. Their current medications and supplements  4. The patient's functional ability including ADL's, fall risks, home safety risks and hearing or visual impairment.  5. Diet and physical activities  6. Evidence for depression or mood disorders 7. The roster of all physicians providing medical care to patient - is listed in the Snapshot section of the chart and reviewed today.    Today patient counseled on age appropriate routine health concerns for screening and prevention, each reviewed and up to date or declined. Immunizations reviewed and up to date or declined. Labs ordered and reviewed. Risk factors for depression reviewed and negative. Hearing function and visual acuity are intact. ADLs screened and addressed as needed. Functional ability and level of safety reviewed and appropriate. Education, counseling and referrals performed based on assessed risks today. Patient provided with a copy of personalized plan for preventive services.

## 2017-01-14 NOTE — Patient Instructions (Signed)

## 2017-01-14 NOTE — Assessment & Plan Note (Signed)
EKG - no changes from before

## 2017-01-14 NOTE — Progress Notes (Signed)
Subjective:  Patient ID: Curtis Jacobs, male    DOB: 08-04-1933  Age: 83 y.o. MRN: [id number]  CC: No chief complaint on file.   HPI Curtis Jacobs presents for a well exam  Outpatient Medications Prior to Visit  Medication Sig Dispense Refill  . Cholecalciferol 1000 UNITS tablet Take 2,000 Units by mouth daily.     . fluocinonide cream (LIDEX) 5.36 % Apply 1 application topically daily.    . Glucosamine-Chondroit-Vit C-Mn (GLUCOSAMINE CHONDROITIN COMPLX) CAPS Take 1 capsule by mouth daily.      Marland Kitchen guaiFENesin (MUCINEX) 600 MG 12 hr tablet Take 2 tablets (1,200 mg total) by mouth 2 (two) times daily.    Marland Kitchen loratadine (CLARITIN) 10 MG tablet Take 10 mg by mouth daily.    Marland Kitchen losartan (COZAAR) 100 MG tablet TAKE 1 TABLET BY MOUTH EVERY DAY 90 tablet 0  . Multiple Vitamin (MULTIVITAMIN) tablet Take 1 tablet by mouth daily.      . niacin 500 MG CR capsule Take 500 mg by mouth at bedtime.      [name] POWD Take by mouth daily.     Marland Kitchen terazosin (HYTRIN) 10 MG capsule TAKE ONE CAPSULE BY MOUTH AT BEDTIME 30 capsule 11  . vitamin C (ASCORBIC ACID) 500 MG tablet Take 500 mg by mouth daily.       Facility-Administered Medications Prior to Visit  Medication Dose Route Frequency Provider Last Rate Last Dose  . methylPREDNISolone acetate (DEPO-MEDROL) 20 MG/ML injection 20 mg  20 mg Intra-Lesional Once Plotnikov, Aleksei V, MD      . methylPREDNISolone acetate (DEPO-MEDROL) injection 40 mg  40 mg Intra-articular Once Plotnikov, Aleksei V, MD      . methylPREDNISolone acetate (DEPO-MEDROL) injection 40 mg  40 mg Intra-articular Once Plotnikov, Evie Lacks, MD        ROS Review of Systems  Constitutional: Negative for appetite change, fatigue and unexpected weight change.  HENT: Negative for congestion, nosebleeds, sneezing, sore throat and trouble swallowing.   Eyes: Negative for itching and visual disturbance.  Respiratory: Negative for cough.   Cardiovascular: Negative for chest pain,  palpitations and leg swelling.  Gastrointestinal: Negative for abdominal distention, blood in stool, diarrhea and nausea.  Genitourinary: Negative for frequency and hematuria.  Musculoskeletal: Negative for back pain, gait problem, joint swelling and neck pain.  Skin: Negative for rash.  Neurological: Negative for dizziness, tremors, speech difficulty and weakness.  Psychiatric/Behavioral: Negative for agitation, dysphoric mood, sleep disturbance and suicidal ideas. The patient is not nervous/anxious.     Objective:  BP 130/82 (BP Location: Left Arm, Patient Position: Sitting, Cuff Size: Normal)   Pulse (!) 58   Temp 97.7 F (36.5 C) (Oral)   Ht 5\' 10"  (1.778 m)   Wt 201 lb (91.2 kg)   SpO2 99%   BMI 28.84 kg/m   BP Readings from Last 3 Encounters:  01/14/17 130/82  08/04/16 138/76  07/16/16 (!) 146/70    Wt Readings from Last 3 Encounters:  01/14/17 201 lb (91.2 kg)  08/04/16 209 lb (94.8 kg)  07/16/16 204 lb (92.5 kg)    Physical Exam  Constitutional: He is oriented to person, place, and time. He appears well-developed. No distress.  NAD  HENT:  Mouth/Throat: Oropharynx is clear and moist.  Eyes: Conjunctivae are normal. Pupils are equal, round, and reactive to light.  Neck: Normal range of motion. No JVD present. No thyromegaly present.  Cardiovascular: Normal rate, regular rhythm, normal heart sounds and  intact distal pulses.  Exam reveals no gallop and no friction rub.   No murmur heard. Pulmonary/Chest: Effort normal and breath sounds normal. No respiratory distress. He has no wheezes. He has no rales. He exhibits no tenderness.  Abdominal: Soft. Bowel sounds are normal. He exhibits no distension and no mass. There is no tenderness. There is no rebound and no guarding.  Musculoskeletal: Normal range of motion. He exhibits tenderness. He exhibits no edema.  Lymphadenopathy:    He has no cervical adenopathy.  Neurological: He is alert and oriented to person, place,  and time. He has normal reflexes. No cranial nerve deficit. He exhibits normal muscle tone. He displays a negative Romberg sign. Coordination and gait normal.  Skin: Skin is warm and dry. No rash noted.  Psychiatric: He has a normal mood and affect. His behavior is normal. Judgment and thought content normal.   Procedure: EKG Indication: bifascicular block Impression: bifascicular block. No acute changes.   Lab Results  Component Value Date   WBC 5.0 01/14/2016   HGB 14.0 01/14/2016   HCT 40.7 01/14/2016   PLT 172.0 01/14/2016   GLUCOSE 92 07/16/2016   CHOL 199 01/14/2016   TRIG 155.0 (H) 01/14/2016   HDL 35.10 (L) 01/14/2016   LDLDIRECT 101.0 01/10/2015   LDLCALC 133 (H) 01/14/2016   ALT 16 01/14/2016   AST 17 01/14/2016   NA 143 07/16/2016   K 4.1 07/16/2016   CL 108 07/16/2016   CREATININE 1.19 07/16/2016   BUN 14 07/16/2016   CO2 29 07/16/2016   TSH 1.42 01/14/2016   PSA 5.70 (H) 07/16/2016    Ct Chest Wo Contrast  Result Date: 07/15/2015 CLINICAL DATA:  Followup indeterminate pulmonary nodules. EXAM: CT CHEST WITHOUT CONTRAST TECHNIQUE: Multidetector CT imaging of the chest was performed following the standard protocol without IV contrast. COMPARISON:  12/28/2013 and 07/14/2013 FINDINGS: Mediastinum/Lymph Nodes: No masses or pathologically enlarged lymph nodes identified on this un-enhanced exam. Lungs/Pleura: Scarring is again seen with upper lobe predominance and mild traction bronchiectasis. No evidence of acute infiltrate or pleural effusion. Scattered sub-cm bilateral pulmonary nodular densities are again seen without significant change. A dominant pulmonary nodule is seen in the left upper lobe measuring 11 x 13 mm on image 23/series 3 which is stable. No new or enlarging pulmonary nodules identified. Upper abdomen: Cholelithiasis again noted. Musculoskeletal: No chest wall mass or suspicious bone lesions identified. IMPRESSION: Stable bilateral pleural-parenchymal  scarring and mild bronchiectasis with upper lung predominance. Stable bilateral pulmonary nodules, largest measuring 13 mm in the left upper lobe, consistent with benign postinflammatory etiology. Electronically Signed   By: Earle Gell M.D.   On: 07/15/2015 09:48    Assessment & Plan:   There are no diagnoses linked to this encounter. I am having Mr. Antonini maintain his GLUCOSAMINE CHONDROITIN COMPLX, multivitamin, niacin, Saw Palmetto, vitamin C, Cholecalciferol, loratadine, guaiFENesin, terazosin, fluocinonide cream, and losartan. We will continue to administer methylPREDNISolone acetate, methylPREDNISolone acetate, and methylPREDNISolone acetate.  No orders of the defined types were placed in this encounter.    Follow-up: No Follow-up on file.  Walker Kehr, MD

## 2017-02-01 DIAGNOSIS — M545 Low back pain: Secondary | ICD-10-CM | POA: Diagnosis not present

## 2017-02-01 DIAGNOSIS — M9903 Segmental and somatic dysfunction of lumbar region: Secondary | ICD-10-CM | POA: Diagnosis not present

## 2017-02-01 DIAGNOSIS — M5136 Other intervertebral disc degeneration, lumbar region: Secondary | ICD-10-CM | POA: Diagnosis not present

## 2017-02-01 DIAGNOSIS — M47816 Spondylosis without myelopathy or radiculopathy, lumbar region: Secondary | ICD-10-CM | POA: Diagnosis not present

## 2017-02-10 ENCOUNTER — Other Ambulatory Visit: Payer: Self-pay | Admitting: Internal Medicine

## 2017-03-11 DIAGNOSIS — M5136 Other intervertebral disc degeneration, lumbar region: Secondary | ICD-10-CM | POA: Diagnosis not present

## 2017-03-11 DIAGNOSIS — M47816 Spondylosis without myelopathy or radiculopathy, lumbar region: Secondary | ICD-10-CM | POA: Diagnosis not present

## 2017-03-11 DIAGNOSIS — M545 Low back pain: Secondary | ICD-10-CM | POA: Diagnosis not present

## 2017-03-11 DIAGNOSIS — M9903 Segmental and somatic dysfunction of lumbar region: Secondary | ICD-10-CM | POA: Diagnosis not present

## 2017-03-20 ENCOUNTER — Other Ambulatory Visit: Payer: Self-pay | Admitting: Internal Medicine

## 2017-03-22 DIAGNOSIS — M47816 Spondylosis without myelopathy or radiculopathy, lumbar region: Secondary | ICD-10-CM | POA: Diagnosis not present

## 2017-03-22 DIAGNOSIS — M9903 Segmental and somatic dysfunction of lumbar region: Secondary | ICD-10-CM | POA: Diagnosis not present

## 2017-03-22 DIAGNOSIS — M545 Low back pain: Secondary | ICD-10-CM | POA: Diagnosis not present

## 2017-03-22 DIAGNOSIS — M5136 Other intervertebral disc degeneration, lumbar region: Secondary | ICD-10-CM | POA: Diagnosis not present

## 2017-04-05 DIAGNOSIS — M9903 Segmental and somatic dysfunction of lumbar region: Secondary | ICD-10-CM | POA: Diagnosis not present

## 2017-04-05 DIAGNOSIS — M5136 Other intervertebral disc degeneration, lumbar region: Secondary | ICD-10-CM | POA: Diagnosis not present

## 2017-04-05 DIAGNOSIS — M47816 Spondylosis without myelopathy or radiculopathy, lumbar region: Secondary | ICD-10-CM | POA: Diagnosis not present

## 2017-04-05 DIAGNOSIS — M545 Low back pain: Secondary | ICD-10-CM | POA: Diagnosis not present

## 2017-04-13 DIAGNOSIS — M542 Cervicalgia: Secondary | ICD-10-CM | POA: Diagnosis not present

## 2017-04-13 DIAGNOSIS — M9901 Segmental and somatic dysfunction of cervical region: Secondary | ICD-10-CM | POA: Diagnosis not present

## 2017-04-13 DIAGNOSIS — M9902 Segmental and somatic dysfunction of thoracic region: Secondary | ICD-10-CM | POA: Diagnosis not present

## 2017-04-13 DIAGNOSIS — M4712 Other spondylosis with myelopathy, cervical region: Secondary | ICD-10-CM | POA: Diagnosis not present

## 2017-05-06 DIAGNOSIS — D1801 Hemangioma of skin and subcutaneous tissue: Secondary | ICD-10-CM | POA: Diagnosis not present

## 2017-05-06 DIAGNOSIS — L821 Other seborrheic keratosis: Secondary | ICD-10-CM | POA: Diagnosis not present

## 2017-05-06 DIAGNOSIS — Z85828 Personal history of other malignant neoplasm of skin: Secondary | ICD-10-CM | POA: Diagnosis not present

## 2017-05-06 DIAGNOSIS — L570 Actinic keratosis: Secondary | ICD-10-CM | POA: Diagnosis not present

## 2017-05-06 DIAGNOSIS — D225 Melanocytic nevi of trunk: Secondary | ICD-10-CM | POA: Diagnosis not present

## 2017-07-15 ENCOUNTER — Ambulatory Visit (INDEPENDENT_AMBULATORY_CARE_PROVIDER_SITE_OTHER)
Admission: RE | Admit: 2017-07-15 | Discharge: 2017-07-15 | Disposition: A | Discharge status: Home / Self Care | Payer: Medicare Other | Source: Ambulatory Visit | Attending: Pulmonary Disease | Admitting: Pulmonary Disease

## 2017-07-15 ENCOUNTER — Ambulatory Visit: Payer: Medicare Other | Admitting: Pulmonary Disease

## 2017-07-15 ENCOUNTER — Encounter: Payer: Self-pay | Admitting: Pulmonary Disease

## 2017-07-15 VITALS — BP 134/68 | HR 64 | Ht 70.0 in | Wt 194.0 lb

## 2017-07-15 DIAGNOSIS — J479 Bronchiectasis, uncomplicated: Secondary | ICD-10-CM | POA: Diagnosis not present

## 2017-07-15 DIAGNOSIS — R05 Cough: Secondary | ICD-10-CM

## 2017-07-15 DIAGNOSIS — R059 Cough, unspecified: Secondary | ICD-10-CM

## 2017-07-15 NOTE — Progress Notes (Signed)
Subjective:    Patient ID: Curtis Jacobs, male    DOB: [birth year], 83 y.o.   MRN: [id number]  Synopsis: Curtis Jacobs has bronchiectasis (post infectious) and a pulmonary nodule.  He had normal spirometry on the first visit with [facility] in 2014.  His bronchiectasis is likely from a bad pneumonia in the 1970's.   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    HPI Chief Complaint  Patient presents with  . Follow-up    1 year follow up, has tickle in throat, coughing to get sputum loose   Curtis Jacobs says that he is feeling older and slower.  He feels a little more unsteady.   In the last year he had some increased symptoms after he was exposed to a moldy barn.  He said that he had a cold a few days after that.  His symptoms started with a sore throat and ended up with a chest infection.  His symptoms typically last for two weeks.  He was treated with a Zpack for that.  This happened three months ago.   Apart from this episode he doesn't think his symptoms have changed.  He says that he has a lot of chest congestion in the mornings.    He doesn't produce mucus though.   No night sweats, unexplained weight loss or fevers.    Past Medical History:  Diagnosis Date  . BPH (benign prostatic hypertrophy)    Dr. Risa Grill  . HA (headache)    Traction- Dr. Jannifer Franklin- had MRI.MRA  . HTN (hypertension)   . Hyperlipidemia      Review of Systems  Constitutional: Negative for chills, fatigue and fever.  HENT: Negative for postnasal drip, rhinorrhea and sinus pressure.   Respiratory: Positive for cough. Negative for shortness of breath and wheezing.   Cardiovascular: Negative for chest pain, palpitations and leg swelling.       Objective:   Physical Exam  Vitals:   07/15/17 0912  BP: 134/68  Pulse: 64  SpO2: 98%  Weight: 194 lb (88 kg)  Height: 5\' 10"  (1.778 m)  RA  Gen: well appearing HENT: OP clear, TM's clear, neck supple PULM: CTA B, normal percussion CV: RRR, no  mgr, trace edema GI: BS+, soft, nontender Derm: no cyanosis or rash Psyche: normal mood and affect   06/2015 CT chest images reviewed again 07/15/2017 in clinid > no change in nodules, round solid nodules left upper lobe, bronchiectasis in the RUL predominant      Assessment & Plan:   Cough - Plan: DG Chest 2 View  Bronchiectasis without complication ([place])  Discussion: Curtis Jacobs has done fairly well in the last year with the exception of one exacerbation.  Not have alarming signs or symptoms of an atypical infection such as weight loss night sweats or unexplained fevers.  However, given his history of bronchiectasis and the exacerbation he had a few months ago I think it would be reasonable to get a chest x-ray to make sure there is no evidence of anything else going on.  He is also got a history of nodules which we know are benign based on lack of growth on imaging and a biopsy which was performed years ago.  He needs to use his flutter valve more frequently we discussed this today  Bronchiectasis: Given the infection you had a few months ago and the daily chest congestion we will get a chest X-ray today to look at your lungs Use a flutter valve  10 breaths, 3 times per day to help clear mucus If you get bronchitis please let us know so we can collect your mucus I'm glad you had a flu shot this year  We will see you back in one year or sooner if needed   Current Outpatient Medications:  .  Cholecalciferol 1000 UNITS tablet, Take 2,000 Units by mouth daily. , Disp: , Rfl:  .  fluocinonide cream (LIDEX) 1.44 %, Apply 1 application topically daily., Disp: , Rfl:  .  Glucosamine-Chondroit-Vit C-Mn (GLUCOSAMINE CHONDROITIN COMPLX) CAPS, Take 1 capsule by mouth daily.  , Disp: , Rfl:  .  guaiFENesin (MUCINEX) 600 MG 12 hr tablet, Take 2 tablets (1,200 mg total) by mouth 2 (two) times daily., Disp: , Rfl:  .  loratadine (CLARITIN) 10 MG tablet, Take 10 mg by mouth daily., Disp: , Rfl:  .   losartan (COZAAR) 100 MG tablet, TAKE 1 TABLET BY MOUTH EVERY DAY, Disp: 90 tablet, Rfl: 0 .  Multiple Vitamin (MULTIVITAMIN) tablet, Take 1 tablet by mouth daily.  , Disp: , Rfl:  .  niacin 500 MG CR capsule, Take 500 mg by mouth at bedtime.  , Disp: , Rfl:  .  Saw Palmetto POWD, Take by mouth daily. , Disp: , Rfl:  .  terazosin (HYTRIN) 10 MG capsule, TAKE ONE CAPSULE BY MOUTH AT BEDTIME, Disp: 90 capsule, Rfl: 2 .  vitamin C (ASCORBIC ACID) 500 MG tablet, Take 500 mg by mouth daily.  , Disp: , Rfl:   Current Facility-Administered Medications:  .  methylPREDNISolone acetate (DEPO-MEDROL) 20 MG/ML injection 20 mg, 20 mg, Intra-Lesional, Once, Plotnikov, Aleksei V, MD .  methylPREDNISolone acetate (DEPO-MEDROL) injection 40 mg, 40 mg, Intra-articular, Once, Plotnikov, Aleksei V, MD .  methylPREDNISolone acetate (DEPO-MEDROL) injection 40 mg, 40 mg, Intra-articular, Once, Plotnikov, Evie Lacks, MD

## 2017-07-15 NOTE — Patient Instructions (Signed)
Bronchiectasis: Given the infection you had a few months ago and the daily chest congestion we will get a chest X-ray today to look at your lungs Use a flutter valve 10 breaths, 3 times per day to help clear mucus If you get bronchitis please let us know so we can collect your mucus I'm glad you had a flu shot this year  We will see you back in one year or sooner if needed

## 2017-07-16 ENCOUNTER — Ambulatory Visit: Payer: Medicare Other | Admitting: Pulmonary Disease

## 2017-07-16 NOTE — Progress Notes (Signed)
Spoke with patient and made him aware of results. He verbalized understanding and did not have any questions. Nothing further is needed.

## 2017-10-08 DIAGNOSIS — M545 Low back pain: Secondary | ICD-10-CM | POA: Diagnosis not present

## 2017-10-08 DIAGNOSIS — M791 Myalgia, unspecified site: Secondary | ICD-10-CM | POA: Diagnosis not present

## 2017-10-22 DIAGNOSIS — M533 Sacrococcygeal disorders, not elsewhere classified: Secondary | ICD-10-CM | POA: Diagnosis not present

## 2017-11-09 DIAGNOSIS — L821 Other seborrheic keratosis: Secondary | ICD-10-CM | POA: Diagnosis not present

## 2017-11-09 DIAGNOSIS — D1801 Hemangioma of skin and subcutaneous tissue: Secondary | ICD-10-CM | POA: Diagnosis not present

## 2017-11-09 DIAGNOSIS — Z85828 Personal history of other malignant neoplasm of skin: Secondary | ICD-10-CM | POA: Diagnosis not present

## 2017-11-09 DIAGNOSIS — L812 Freckles: Secondary | ICD-10-CM | POA: Diagnosis not present

## 2017-11-09 DIAGNOSIS — D2271 Melanocytic nevi of right lower limb, including hip: Secondary | ICD-10-CM | POA: Diagnosis not present

## 2017-11-13 ENCOUNTER — Other Ambulatory Visit: Payer: Self-pay | Admitting: Internal Medicine

## 2017-11-16 DIAGNOSIS — M533 Sacrococcygeal disorders, not elsewhere classified: Secondary | ICD-10-CM | POA: Diagnosis not present

## 2017-12-07 DIAGNOSIS — M533 Sacrococcygeal disorders, not elsewhere classified: Secondary | ICD-10-CM | POA: Diagnosis not present

## 2018-01-18 ENCOUNTER — Encounter: Payer: Medicare Other | Admitting: Internal Medicine

## 2018-01-20 ENCOUNTER — Other Ambulatory Visit: Payer: Self-pay

## 2018-01-20 MED ORDER — TERAZOSIN HCL 10 MG PO CAPS: 10.0000 mg | ORAL_CAPSULE | Freq: Every day | ORAL | 0 refills | Status: DC

## 2018-01-27 ENCOUNTER — Other Ambulatory Visit (INDEPENDENT_AMBULATORY_CARE_PROVIDER_SITE_OTHER): Payer: Medicare Other

## 2018-01-27 ENCOUNTER — Ambulatory Visit (INDEPENDENT_AMBULATORY_CARE_PROVIDER_SITE_OTHER): Payer: Medicare Other | Admitting: Internal Medicine

## 2018-01-27 ENCOUNTER — Encounter: Payer: Self-pay | Admitting: Internal Medicine

## 2018-01-27 VITALS — BP 136/64 | HR 58 | Temp 98.3°F | Ht 70.0 in | Wt 196.0 lb

## 2018-01-27 DIAGNOSIS — E785 Hyperlipidemia, unspecified: Secondary | ICD-10-CM | POA: Diagnosis not present

## 2018-01-27 DIAGNOSIS — J479 Bronchiectasis, uncomplicated: Secondary | ICD-10-CM | POA: Diagnosis not present

## 2018-01-27 DIAGNOSIS — R972 Elevated prostate specific antigen [PSA]: Secondary | ICD-10-CM

## 2018-01-27 DIAGNOSIS — M2550 Pain in unspecified joint: Secondary | ICD-10-CM

## 2018-01-27 DIAGNOSIS — Z0000 Encounter for general adult medical examination without abnormal findings: Secondary | ICD-10-CM

## 2018-01-27 DIAGNOSIS — N401 Enlarged prostate with lower urinary tract symptoms: Secondary | ICD-10-CM

## 2018-01-27 DIAGNOSIS — I10 Essential (primary) hypertension: Secondary | ICD-10-CM

## 2018-01-27 DIAGNOSIS — G8929 Other chronic pain: Secondary | ICD-10-CM

## 2018-01-27 DIAGNOSIS — M545 Low back pain, unspecified: Secondary | ICD-10-CM | POA: Insufficient documentation

## 2018-01-27 DIAGNOSIS — M5442 Lumbago with sciatica, left side: Secondary | ICD-10-CM

## 2018-01-27 DIAGNOSIS — M5441 Lumbago with sciatica, right side: Secondary | ICD-10-CM

## 2018-01-27 LAB — CBC WITH DIFFERENTIAL/PLATELET
BASOS PCT: 1.1 % (ref 0.0–3.0)
Basophils Absolute: 0.1 10*3/uL (ref 0.0–0.1)
EOS PCT: 1.5 % (ref 0.0–5.0)
Eosinophils Absolute: 0.1 10*3/uL (ref 0.0–0.7)
HEMATOCRIT: 40.3 % (ref 39.0–52.0)
Hemoglobin: 14 g/dL (ref 13.0–17.0)
LYMPHS ABS: 1.4 10*3/uL (ref 0.7–4.0)
LYMPHS PCT: 25.4 % (ref 12.0–46.0)
MCHC: 34.6 g/dL (ref 30.0–36.0)
MCV: 90.7 fl (ref 78.0–100.0)
MONOS PCT: 7.6 % (ref 3.0–12.0)
Monocytes Absolute: 0.4 10*3/uL (ref 0.1–1.0)
NEUTROS ABS: 3.5 10*3/uL (ref 1.4–7.7)
NEUTROS PCT: 64.4 % (ref 43.0–77.0)
PLATELETS: 169 10*3/uL (ref 150.0–400.0)
RBC: 4.45 Mil/uL (ref 4.22–5.81)
RDW: 12.9 % (ref 11.5–15.5)
WBC: 5.5 10*3/uL (ref 4.0–10.5)

## 2018-01-27 LAB — URINALYSIS
Bilirubin Urine: NEGATIVE
Hgb urine dipstick: NEGATIVE
KETONES UR: NEGATIVE
Leukocytes, UA: NEGATIVE
Nitrite: NEGATIVE
PH: 6 (ref 5.0–8.0)
SPECIFIC GRAVITY, URINE: 1.01 (ref 1.000–1.030)
TOTAL PROTEIN, URINE-UPE24: NEGATIVE
URINE GLUCOSE: NEGATIVE
Urobilinogen, UA: 0.2 (ref 0.0–1.0)

## 2018-01-27 LAB — BASIC METABOLIC PANEL
BUN: 16 mg/dL (ref 6–23)
CALCIUM: 9.5 mg/dL (ref 8.4–10.5)
CO2: 27 mEq/L (ref 19–32)
Chloride: 108 mEq/L (ref 96–112)
Creatinine, Ser: 1.24 mg/dL (ref 0.40–1.50)
GFR: 59.23 mL/min — AB (ref >60.00)
GLUCOSE: 98 mg/dL (ref 70–99)
POTASSIUM: 4.4 meq/L (ref 3.5–5.1)
Sodium: 142 mEq/L (ref 135–145)

## 2018-01-27 LAB — LIPID PANEL
CHOLESTEROL: 191 mg/dL (ref 0–200)
HDL: 36.2 mg/dL — AB (ref >39.00)
LDL Cholesterol: 126 mg/dL — ABNORMAL HIGH (ref 0–99)
NonHDL: 154.57
Total CHOL/HDL Ratio: 5
Triglycerides: 144 mg/dL (ref 0.0–149.0)
VLDL: 28.8 mg/dL (ref 0.0–40.0)

## 2018-01-27 LAB — HEPATIC FUNCTION PANEL
ALBUMIN: 4 g/dL (ref 3.5–5.2)
ALT: 17 U/L (ref 0–53)
AST: 18 U/L (ref 0–37)
Alkaline Phosphatase: 123 U/L — ABNORMAL HIGH (ref 39–117)
BILIRUBIN TOTAL: 0.7 mg/dL (ref 0.2–1.2)
Bilirubin, Direct: 0.1 mg/dL (ref 0.0–0.3)
Total Protein: 6.5 g/dL (ref 6.0–8.3)

## 2018-01-27 LAB — TSH: TSH: 2.02 u[IU]/mL (ref 0.35–4.50)

## 2018-01-27 LAB — PSA: PSA: 5.68 ng/mL — ABNORMAL HIGH (ref 0.10–4.00)

## 2018-01-27 MED ORDER — ZOSTER VAC RECOMB ADJUVANTED 50 MCG/0.5ML IM SUSR: 0.5000 mL | Freq: Once | INTRAMUSCULAR | 1 refills | Status: AC

## 2018-01-27 NOTE — Assessment & Plan Note (Signed)
Labs

## 2018-01-27 NOTE — Assessment & Plan Note (Signed)
Here for medicare wellness/physical  Diet: heart healthy  Physical activity: not sedentary  Depression/mood screen: negative  Hearing: intact to whispered voice  Visual acuity: grossly normal w/glasses, performs annual eye exam  ADLs: capable  Fall risk: low  Home safety: good  Cognitive evaluation: intact to orientation, naming, recall and repetition  EOL planning: adv directives, full code/ I agree  I have personally reviewed and have noted  1. The patient's medical, surgical and social history  2. Their use of alcohol, tobacco or illicit drugs  3. Their current medications and supplements  4. The patient's functional ability including ADL's, fall risks, home safety risks and hearing or visual impairment.  5. Diet and physical activities  6. Evidence for depression or mood disorders 7. The roster of all physicians providing medical care to patient - is listed in the Snapshot section of the chart and reviewed today.    Today patient counseled on age appropriate routine health concerns for screening and prevention, each reviewed and up to date or declined. Immunizations reviewed and up to date or declined. Labs ordered and reviewed. Risk factors for depression reviewed and negative. Hearing function and visual acuity are intact. ADLs screened and addressed as needed. Functional ability and level of safety reviewed and appropriate. Education, counseling and referrals performed based on assessed risks today. Patient provided with a copy of personalized plan for preventive services.      

## 2018-01-27 NOTE — Assessment & Plan Note (Signed)
Dr Jacelyn Grip - injections

## 2018-01-27 NOTE — Assessment & Plan Note (Signed)
Hytrin PSA

## 2018-01-27 NOTE — Assessment & Plan Note (Signed)
PSA

## 2018-01-27 NOTE — Progress Notes (Signed)
Subjective:  Patient ID: Curtis Jacobs, male    DOB: November 20, 1932  Age: 84 y.o. MRN: [id number]  CC: No chief complaint on file.   HPI ATOM HERRERA presents for a well exam C/o LBP, bronchiectases. C/o lightheadedness, low BP - he cut back on Losartan to 50 mg/d C/o tick bites - achy  Outpatient Medications Prior to Visit  Medication Sig Dispense Refill  . Cholecalciferol 1000 UNITS tablet Take 2,000 Units by mouth daily.     . fluocinonide cream (LIDEX) 0.05 % Apply 1 application topically daily.    . Glucosamine-Chondroit-Vit C-Mn (GLUCOSAMINE CHONDROITIN COMPLX) CAPS Take 1 capsule by mouth daily.      Marland Kitchen guaiFENesin (MUCINEX) 600 MG 12 hr tablet Take 2 tablets (1,200 mg total) by mouth 2 (two) times daily.    Marland Kitchen loratadine (CLARITIN) 10 MG tablet Take 10 mg by mouth daily.    Marland Kitchen losartan (COZAAR) 100 MG tablet TAKE 1 TABLET BY MOUTH EVERY DAY 90 tablet 3  . Multiple Vitamin (MULTIVITAMIN) tablet Take 1 tablet by mouth daily.      . niacin 500 MG CR capsule Take 500 mg by mouth at bedtime.      [name] POWD Take by mouth daily.     Marland Kitchen terazosin (HYTRIN) 10 MG capsule Take 1 capsule (10 mg total) by mouth at bedtime. 90 capsule 0  . vitamin C (ASCORBIC ACID) 500 MG tablet Take 500 mg by mouth daily.       Facility-Administered Medications Prior to Visit  Medication Dose Route Frequency Provider Last Rate Last Dose  . methylPREDNISolone acetate (DEPO-MEDROL) 20 MG/ML injection 20 mg  20 mg Intra-Lesional Once Jyron Turman V, MD      . methylPREDNISolone acetate (DEPO-MEDROL) injection 40 mg  40 mg Intra-articular Once Chais Fehringer V, MD      . methylPREDNISolone acetate (DEPO-MEDROL) injection 40 mg  40 mg Intra-articular Once Keshawna Dix, [name], MD        ROS: Review of Systems  Constitutional: Positive for fatigue. Negative for appetite change and unexpected weight change.  HENT: Negative for congestion, nosebleeds, sneezing, sore throat and trouble swallowing.    Eyes: Negative for itching and visual disturbance.  Respiratory: Positive for cough and shortness of breath.   Cardiovascular: Negative for chest pain, palpitations and leg swelling.  Gastrointestinal: Negative for abdominal distention, blood in stool, diarrhea and nausea.  Genitourinary: Negative for frequency and hematuria.  Musculoskeletal: Positive for arthralgias and back pain. Negative for gait problem, joint swelling and neck pain.  Skin: Negative for rash.  Neurological: Positive for light-headedness. Negative for dizziness, tremors, speech difficulty and weakness.  Psychiatric/Behavioral: Positive for decreased concentration. Negative for agitation, dysphoric mood, sleep disturbance and suicidal ideas. The patient is not nervous/anxious.     Objective:  BP 136/64 (BP Location: Left Arm, Patient Position: Sitting, Cuff Size: Large)   Pulse (!) 58   Temp 98.3 F (36.8 C) (Oral)   Ht 5\' 10"  (1.778 m)   Wt 196 lb (88.9 kg)   SpO2 98%   BMI 28.12 kg/m   BP Readings from Last 3 Encounters:  01/27/18 136/64  07/15/17 134/68  01/14/17 130/82    Wt Readings from Last 3 Encounters:  01/27/18 196 lb (88.9 kg)  07/15/17 194 lb (88 kg)  01/14/17 201 lb (91.2 kg)    Physical Exam  Constitutional: He is oriented to person, place, and time. He appears well-developed. No distress.  NAD  HENT:  Mouth/Throat:  Oropharynx is clear and moist.  Eyes: Pupils are equal, round, and reactive to light. Conjunctivae are normal.  Neck: Normal range of motion. No JVD present. No thyromegaly present.  Cardiovascular: Normal rate, regular rhythm, normal heart sounds and intact distal pulses. Exam reveals no gallop and no friction rub.  No murmur heard. Pulmonary/Chest: Effort normal and breath sounds normal. No respiratory distress. He has no wheezes. He has no rales. He exhibits no tenderness.  Abdominal: Soft. Bowel sounds are normal. He exhibits no distension and no mass. There is no  tenderness. There is no rebound and no guarding.  Genitourinary: Rectal exam shows guaiac negative stool.  Musculoskeletal: Normal range of motion. He exhibits no edema or tenderness.  Lymphadenopathy:    He has no cervical adenopathy.  Neurological: He is alert and oriented to person, place, and time. He has normal reflexes. No cranial nerve deficit. He exhibits normal muscle tone. He displays a negative Romberg sign. Coordination and gait normal.  Skin: Skin is warm and dry. No rash noted.  Psychiatric: He has a normal mood and affect. His behavior is normal. Judgment and thought content normal.  prostate 2+  Lab Results  Component Value Date   WBC 5.3 01/14/2017   HGB 14.1 01/14/2017   HCT 41.7 01/14/2017   PLT 170.0 01/14/2017   GLUCOSE 94 01/14/2017   CHOL 201 (H) 01/14/2017   TRIG 163.0 (H) 01/14/2017   HDL 35.00 (L) 01/14/2017   LDLDIRECT 101.0 01/10/2015   LDLCALC 133 (H) 01/14/2017   ALT 16 01/14/2017   AST 15 01/14/2017   NA 141 01/14/2017   K 4.2 01/14/2017   CL 110 01/14/2017   CREATININE 1.27 01/14/2017   BUN 16 01/14/2017   CO2 28 01/14/2017   TSH 2.16 01/14/2017   PSA 3.51 01/14/2017    Dg Chest 2 View  Result Date: 07/15/2017 CLINICAL DATA:  Chronic cough and congestion. History of pulmonary nodules or bronchiectasis. EXAM: CHEST  2 VIEW COMPARISON:  07/15/2015. FINDINGS: Mediastinum hilar structures normal. Heart size normal. No focal infiltrate. No pulmonary nodule noted on today's exam. Right apical pleural-parenchymal thickening again noted most consistent with scarring. Heart size normal. Thoracic spine scoliosis and degenerative change. Surgical clips right axilla. IMPRESSION: 1. Right apical pleural-parenchymal thickening consistent with scarring. 2.  No acute cardiopulmonary disease. Electronically Signed   By: Maisie Fus  Register   On: 07/15/2017 10:16    Assessment & Plan:   There are no diagnoses linked to this encounter.   No orders of the defined  types were placed in this encounter.    Follow-up: No follow-ups on file.  Sonda Primes, MD

## 2018-01-27 NOTE — Addendum Note (Signed)
Addended by: Karren Cobble on: 01/27/2018 10:53 AM   Modules accepted: Orders

## 2018-01-27 NOTE — Patient Instructions (Addendum)
Stop Losartan Health Maintenance, Male A healthy lifestyle and preventive care is important for your health and wellness. Ask your health care provider about what schedule of regular examinations is right for you. What should I know about weight and diet? Eat a Healthy Diet  Eat plenty of vegetables, fruits, whole grains, low-fat dairy products, and lean protein.  Do not eat a lot of foods high in solid fats, added sugars, or salt.  Maintain a Healthy Weight Regular exercise can help you achieve or maintain a healthy weight. You should:  Do at least 150 minutes of exercise each week. The exercise should increase your heart rate and make you sweat (moderate-intensity exercise).  Do strength-training exercises at least twice a week.  Watch Your Levels of Cholesterol and Blood Lipids  Have your blood tested for lipids and cholesterol every 5 years starting at 82 years of age. If you are at high risk for heart disease, you should start having your blood tested when you are 82 years old. You may need to have your cholesterol levels checked more often if: ? Your lipid or cholesterol levels are high. ? You are older than 82 years of age. ? You are at high risk for heart disease.  What should I know about cancer screening? Many types of cancers can be detected early and may often be prevented. Lung Cancer  You should be screened every year for lung cancer if: ? You are a current smoker who has smoked for at least 30 years. ? You are a former smoker who has quit within the past 15 years.  Talk to your health care provider about your screening options, when you should start screening, and how often you should be screened.  Colorectal Cancer  Routine colorectal cancer screening usually begins at 82 years of age and should be repeated every 5-10 years until you are 82 years old. You may need to be screened more often if early forms of precancerous polyps or small growths are found. Your  health care provider may recommend screening at an earlier age if you have risk factors for colon cancer.  Your health care provider may recommend using home test kits to check for hidden blood in the stool.  A small camera at the end of a tube can be used to examine your colon (sigmoidoscopy or colonoscopy). This checks for the earliest forms of colorectal cancer.  Prostate and Testicular Cancer  Depending on your age and overall health, your health care provider may do certain tests to screen for prostate and testicular cancer.  Talk to your health care provider about any symptoms or concerns you have about testicular or prostate cancer.  Skin Cancer  Check your skin from head to toe regularly.  Tell your health care provider about any new moles or changes in moles, especially if: ? There is a change in a mole's size, shape, or color. ? You have a mole that is larger than a pencil eraser.  Always use sunscreen. Apply sunscreen liberally and repeat throughout the day.  Protect yourself by wearing long sleeves, pants, a wide-brimmed hat, and sunglasses when outside.  What should I know about heart disease, diabetes, and high blood pressure?  If you are 17-68 years of age, have your blood pressure checked every 3-5 years. If you are 61 years of age or older, have your blood pressure checked every year. You should have your blood pressure measured twice-once when you are at a hospital or clinic, and  once when you are not at a hospital or clinic. Record the average of the two measurements. To check your blood pressure when you are not at a hospital or clinic, you can use: ? An automated blood pressure machine at a pharmacy. ? A home blood pressure monitor.  Talk to your health care provider about your target blood pressure.  If you are between 50-78 years old, ask your health care provider if you should take aspirin to prevent heart disease.  Have regular diabetes screenings by  checking your fasting blood sugar level. ? If you are at a normal weight and have a low risk for diabetes, have this test once every three years after the age of 51. ? If you are overweight and have a high risk for diabetes, consider being tested at a younger age or more often.  A one-time screening for abdominal aortic aneurysm (AAA) by ultrasound is recommended for men aged 75-75 years who are current or former smokers. What should I know about preventing infection? Hepatitis B If you have a higher risk for hepatitis B, you should be screened for this virus. Talk with your health care provider to find out if you are at risk for hepatitis B infection. Hepatitis C Blood testing is recommended for:  Everyone born from [birth year] through 1963.  Anyone with known risk factors for hepatitis C.  Sexually Transmitted Diseases (STDs)  You should be screened each year for STDs including gonorrhea and chlamydia if: ? You are sexually active and are younger than 82 years of age. ? You are older than 82 years of age and your health care provider tells you that you are at risk for this type of infection. ? Your sexual activity has changed since you were last screened and you are at an increased risk for chlamydia or gonorrhea. Ask your health care provider if you are at risk.  Talk with your health care provider about whether you are at high risk of being infected with HIV. Your health care provider may recommend a prescription medicine to help prevent HIV infection.  What else can I do?  Schedule regular health, dental, and eye exams.  Stay current with your vaccines (immunizations).  Do not use any tobacco products, such as cigarettes, chewing tobacco, and e-cigarettes. If you need help quitting, ask your health care provider.  Limit alcohol intake to no more than 2 drinks per day. One drink equals 12 ounces of beer, 5 ounces of wine, or 1 ounces of hard liquor.  Do not use street drugs.  Do not  share needles.  Ask your health care provider for help if you need support or information about quitting drugs.  Tell your health care provider if you often feel depressed.  Tell your health care provider if you have ever been abused or do not feel safe at home. This information is not intended to replace advice given to you by your health care provider. Make sure you discuss any questions you have with your health care provider. Document Released: 01/30/2008 Document Revised: 04/01/2016 Document Reviewed: 05/07/2015 Elsevier Interactive Patient Education  [employer].

## 2018-01-27 NOTE — Assessment & Plan Note (Signed)
D/c Losartan due to low BP

## 2018-01-31 LAB — LYME DISEASE, WESTERN BLOT
IGG P18 AB.: ABSENT
IGG P39 AB.: ABSENT
IGG P58 AB.: ABSENT
IGG P66 AB.: ABSENT
IGG P93 AB.: ABSENT
IGM P23 AB.: ABSENT
IGM P39 AB.: ABSENT
IgG P23 Ab.: ABSENT
IgG P28 Ab.: ABSENT
IgG P30 Ab.: ABSENT
IgG P45 Ab.: ABSENT
IgM P41 Ab.: ABSENT
Lyme IgG Wb: NEGATIVE
Lyme IgM Wb: NEGATIVE

## 2018-03-02 DIAGNOSIS — M533 Sacrococcygeal disorders, not elsewhere classified: Secondary | ICD-10-CM | POA: Diagnosis not present

## 2018-03-18 DIAGNOSIS — M1711 Unilateral primary osteoarthritis, right knee: Secondary | ICD-10-CM | POA: Diagnosis not present

## 2018-03-18 DIAGNOSIS — M7581 Other shoulder lesions, right shoulder: Secondary | ICD-10-CM | POA: Diagnosis not present

## 2018-04-21 ENCOUNTER — Other Ambulatory Visit: Payer: Self-pay

## 2018-04-21 MED ORDER — TERAZOSIN HCL 10 MG PO CAPS: 10.0000 mg | ORAL_CAPSULE | Freq: Every day | ORAL | 1 refills | Status: DC

## 2018-05-13 DIAGNOSIS — Z85828 Personal history of other malignant neoplasm of skin: Secondary | ICD-10-CM | POA: Diagnosis not present

## 2018-05-13 DIAGNOSIS — L570 Actinic keratosis: Secondary | ICD-10-CM | POA: Diagnosis not present

## 2018-05-13 DIAGNOSIS — L812 Freckles: Secondary | ICD-10-CM | POA: Diagnosis not present

## 2018-05-13 DIAGNOSIS — D1801 Hemangioma of skin and subcutaneous tissue: Secondary | ICD-10-CM | POA: Diagnosis not present

## 2018-05-13 DIAGNOSIS — L821 Other seborrheic keratosis: Secondary | ICD-10-CM | POA: Diagnosis not present

## 2018-05-13 DIAGNOSIS — D692 Other nonthrombocytopenic purpura: Secondary | ICD-10-CM | POA: Diagnosis not present

## 2018-06-01 DIAGNOSIS — H5203 Hypermetropia, bilateral: Secondary | ICD-10-CM | POA: Diagnosis not present

## 2018-06-01 DIAGNOSIS — H524 Presbyopia: Secondary | ICD-10-CM | POA: Diagnosis not present

## 2018-06-01 DIAGNOSIS — H2513 Age-related nuclear cataract, bilateral: Secondary | ICD-10-CM | POA: Diagnosis not present

## 2018-06-03 DIAGNOSIS — S61412A Laceration without foreign body of left hand, initial encounter: Secondary | ICD-10-CM | POA: Diagnosis not present

## 2018-06-03 DIAGNOSIS — W5501XA Bitten by cat, initial encounter: Secondary | ICD-10-CM | POA: Diagnosis not present

## 2018-06-24 DIAGNOSIS — S61412D Laceration without foreign body of left hand, subsequent encounter: Secondary | ICD-10-CM | POA: Diagnosis not present

## 2018-06-24 DIAGNOSIS — W5501XD Bitten by cat, subsequent encounter: Secondary | ICD-10-CM | POA: Diagnosis not present

## 2018-07-27 DIAGNOSIS — M533 Sacrococcygeal disorders, not elsewhere classified: Secondary | ICD-10-CM | POA: Diagnosis not present

## 2018-07-28 ENCOUNTER — Ambulatory Visit (INDEPENDENT_AMBULATORY_CARE_PROVIDER_SITE_OTHER): Payer: Medicare Other | Admitting: Internal Medicine

## 2018-07-28 ENCOUNTER — Encounter: Payer: Self-pay | Admitting: Internal Medicine

## 2018-07-28 ENCOUNTER — Other Ambulatory Visit (INDEPENDENT_AMBULATORY_CARE_PROVIDER_SITE_OTHER): Payer: Medicare Other

## 2018-07-28 VITALS — BP 124/58 | HR 75 | Temp 97.9°F | Ht 70.0 in | Wt 200.0 lb

## 2018-07-28 DIAGNOSIS — R972 Elevated prostate specific antigen [PSA]: Secondary | ICD-10-CM

## 2018-07-28 DIAGNOSIS — I10 Essential (primary) hypertension: Secondary | ICD-10-CM

## 2018-07-28 DIAGNOSIS — M5441 Lumbago with sciatica, right side: Secondary | ICD-10-CM

## 2018-07-28 DIAGNOSIS — N401 Enlarged prostate with lower urinary tract symptoms: Secondary | ICD-10-CM

## 2018-07-28 DIAGNOSIS — D485 Neoplasm of uncertain behavior of skin: Secondary | ICD-10-CM

## 2018-07-28 DIAGNOSIS — M5442 Lumbago with sciatica, left side: Secondary | ICD-10-CM | POA: Diagnosis not present

## 2018-07-28 DIAGNOSIS — G8929 Other chronic pain: Secondary | ICD-10-CM

## 2018-07-28 LAB — BASIC METABOLIC PANEL
BUN: 17 mg/dL (ref 6–23)
CALCIUM: 9.8 mg/dL (ref 8.4–10.5)
CO2: 26 meq/L (ref 19–32)
Chloride: 107 mEq/L (ref 96–112)
Creatinine, Ser: 1.25 mg/dL (ref 0.40–1.50)
GFR: 58.62 mL/min — ABNORMAL LOW (ref >60.00)
Glucose, Bld: 130 mg/dL — ABNORMAL HIGH (ref 70–99)
POTASSIUM: 3.9 meq/L (ref 3.5–5.1)
Sodium: 139 mEq/L (ref 135–145)

## 2018-07-28 LAB — PSA: PSA: 3.55 ng/mL (ref 0.10–4.00)

## 2018-07-28 LAB — TSH: TSH: 0.55 u[IU]/mL (ref 0.35–4.50)

## 2018-07-28 MED ORDER — CLOTRIMAZOLE-BETAMETHASONE 1-0.05 % EX CREA: 1.0000 "application " | TOPICAL_CREAM | Freq: Two times a day (BID) | CUTANEOUS | 1 refills | Status: AC

## 2018-07-28 NOTE — Assessment & Plan Note (Signed)
Hytrin 

## 2018-07-28 NOTE — Progress Notes (Signed)
Subjective:  Patient ID: Curtis Jacobs, male    DOB: 30-Jun-1933  Age: 84 y.o. MRN: [id number]  CC: No chief complaint on file.   HPI CAMARION WEIER presents for HTN - off meds (feeling better) F/u allergies, OA f/u. C/o LBP irrad to the L  Outpatient Medications Prior to Visit  Medication Sig Dispense Refill  . Cholecalciferol 1000 UNITS tablet Take 2,000 Units by mouth daily.     . fluocinonide cream (LIDEX) 3.53 % Apply 1 application topically daily.    . Glucosamine-Chondroit-Vit C-Mn (GLUCOSAMINE CHONDROITIN COMPLX) CAPS Take 1 capsule by mouth daily.      Marland Kitchen guaiFENesin (MUCINEX) 600 MG 12 hr tablet Take 2 tablets (1,200 mg total) by mouth 2 (two) times daily.    Marland Kitchen loratadine (CLARITIN) 10 MG tablet Take 10 mg by mouth daily.    . Multiple Vitamin (MULTIVITAMIN) tablet Take 1 tablet by mouth daily.      . niacin 500 MG CR capsule Take 500 mg by mouth at bedtime.      [name] POWD Take by mouth daily.     Marland Kitchen terazosin (HYTRIN) 10 MG capsule Take 1 capsule (10 mg total) by mouth at bedtime. 90 capsule 1  . vitamin C (ASCORBIC ACID) 500 MG tablet Take 500 mg by mouth daily.       Facility-Administered Medications Prior to Visit  Medication Dose Route Frequency Provider Last Rate Last Dose  . methylPREDNISolone acetate (DEPO-MEDROL) 20 MG/ML injection 20 mg  20 mg Intra-Lesional Once Plotnikov, Aleksei V, MD      . methylPREDNISolone acetate (DEPO-MEDROL) injection 40 mg  40 mg Intra-articular Once Plotnikov, Aleksei V, MD      . methylPREDNISolone acetate (DEPO-MEDROL) injection 40 mg  40 mg Intra-articular Once Plotnikov, Evie Lacks, MD        ROS: Review of Systems  Constitutional: Negative for appetite change, fatigue and unexpected weight change.  HENT: Negative for congestion, nosebleeds, sneezing, sore throat and trouble swallowing.   Eyes: Negative for itching and visual disturbance.  Respiratory: Negative for cough.   Cardiovascular: Negative for chest pain,  palpitations and leg swelling.  Gastrointestinal: Negative for abdominal distention, blood in stool, diarrhea and nausea.  Genitourinary: Negative for frequency and hematuria.  Musculoskeletal: Positive for arthralgias and back pain. Negative for gait problem, joint swelling and neck pain.  Skin: Negative for rash.  Neurological: Negative for dizziness, tremors, speech difficulty and weakness.  Psychiatric/Behavioral: Negative for agitation, dysphoric mood and sleep disturbance. The patient is not nervous/anxious.     Objective:  BP (!) 124/58 (BP Location: Left Arm, Patient Position: Sitting, Cuff Size: Normal)   Pulse 75   Temp 97.9 F (36.6 C) (Oral)   Ht 5\' 10"  (1.778 m)   Wt 200 lb (90.7 kg)   SpO2 95%   BMI 28.70 kg/m   BP Readings from Last 3 Encounters:  07/28/18 (!) 124/58  01/27/18 136/64  07/15/17 134/68    Wt Readings from Last 3 Encounters:  07/28/18 200 lb (90.7 kg)  01/27/18 196 lb (88.9 kg)  07/15/17 194 lb (88 kg)    Physical Exam Constitutional:      General: He is not in acute distress.    Appearance: He is well-developed.     Comments: NAD  Eyes:     Conjunctiva/sclera: Conjunctivae normal.     Pupils: Pupils are equal, round, and reactive to light.  Neck:     Musculoskeletal: Normal range of motion.  Thyroid: No thyromegaly.     Vascular: No JVD.  Cardiovascular:     Rate and Rhythm: Normal rate and regular rhythm.     Heart sounds: Normal heart sounds. No murmur. No friction rub. No gallop.   Pulmonary:     Effort: Pulmonary effort is normal. No respiratory distress.     Breath sounds: Normal breath sounds. No wheezing or rales.  Chest:     Chest wall: No tenderness.  Abdominal:     General: Bowel sounds are normal. There is no distension.     Palpations: Abdomen is soft. There is no mass.     Tenderness: There is no abdominal tenderness. There is no guarding or rebound.  Musculoskeletal: Normal range of motion.        General:  Tenderness present.  Lymphadenopathy:     Cervical: No cervical adenopathy.  Skin:    General: Skin is warm and dry.     Findings: No rash.  Neurological:     Mental Status: He is alert and oriented to person, place, and time.     Cranial Nerves: No cranial nerve deficit.     Motor: No abnormal muscle tone.     Coordination: Coordination normal.     Gait: Gait normal.     Deep Tendon Reflexes: Reflexes are normal and symmetric.  Psychiatric:        Behavior: Behavior normal.        Thought Content: Thought content normal.        Judgment: Judgment normal.   LS tender  L buttock lesion B buttock rash patch  Lab Results  Component Value Date   WBC 5.5 01/27/2018   HGB 14.0 01/27/2018   HCT 40.3 01/27/2018   PLT 169.0 01/27/2018   GLUCOSE 98 01/27/2018   CHOL 191 01/27/2018   TRIG 144.0 01/27/2018   HDL 36.20 (L) 01/27/2018   LDLDIRECT 101.0 01/10/2015   LDLCALC 126 (H) 01/27/2018   ALT 17 01/27/2018   AST 18 01/27/2018   NA 142 01/27/2018   K 4.4 01/27/2018   CL 108 01/27/2018   CREATININE 1.24 01/27/2018   BUN 16 01/27/2018   CO2 27 01/27/2018   TSH 2.02 01/27/2018   PSA 5.68 (H) 01/27/2018    Dg Chest 2 View  Result Date: 07/15/2017 CLINICAL DATA:  Chronic cough and congestion. History of pulmonary nodules or bronchiectasis. EXAM: CHEST  2 VIEW COMPARISON:  07/15/2015. FINDINGS: Mediastinum hilar structures normal. Heart size normal. No focal infiltrate. No pulmonary nodule noted on today's exam. Right apical pleural-parenchymal thickening again noted most consistent with scarring. Heart size normal. Thoracic spine scoliosis and degenerative change. Surgical clips right axilla. IMPRESSION: 1. Right apical pleural-parenchymal thickening consistent with scarring. 2.  No acute cardiopulmonary disease. Electronically Signed   By: Everett   On: 07/15/2017 10:16    Assessment & Plan:   There are no diagnoses linked to this encounter.   No orders of the  defined types were placed in this encounter.    Follow-up: No follow-ups on file.  Walker Kehr, MD

## 2018-07-28 NOTE — Assessment & Plan Note (Signed)
F/u w/Urology 

## 2018-07-28 NOTE — Patient Instructions (Addendum)
Valerian root for insomnia  Skin biopsy w/me

## 2018-07-28 NOTE — Assessment & Plan Note (Signed)
L buttock lesion - skin bx Lotrisone B buttock rash patch

## 2018-07-28 NOTE — Assessment & Plan Note (Signed)
Pt had a steroid spinal inj yesterday

## 2018-07-28 NOTE — Assessment & Plan Note (Signed)
On Hytrin 

## 2018-08-19 DIAGNOSIS — H2513 Age-related nuclear cataract, bilateral: Secondary | ICD-10-CM | POA: Diagnosis not present

## 2018-08-23 ENCOUNTER — Other Ambulatory Visit: Payer: Medicare Other

## 2018-08-23 ENCOUNTER — Ambulatory Visit (INDEPENDENT_AMBULATORY_CARE_PROVIDER_SITE_OTHER): Payer: Medicare Other | Admitting: Internal Medicine

## 2018-08-23 ENCOUNTER — Encounter: Payer: Self-pay | Admitting: Internal Medicine

## 2018-08-23 VITALS — BP 140/72 | HR 77 | Temp 98.2°F | Ht 70.0 in | Wt 200.0 lb

## 2018-08-23 DIAGNOSIS — L988 Other specified disorders of the skin and subcutaneous tissue: Secondary | ICD-10-CM

## 2018-08-23 DIAGNOSIS — D485 Neoplasm of uncertain behavior of skin: Secondary | ICD-10-CM

## 2018-08-23 DIAGNOSIS — L309 Dermatitis, unspecified: Secondary | ICD-10-CM | POA: Diagnosis not present

## 2018-08-23 MED ORDER — TRIAMCINOLONE ACETONIDE 0.1 % EX OINT: 1.0000 "application " | TOPICAL_OINTMENT | Freq: Three times a day (TID) | CUTANEOUS | 1 refills | Status: DC

## 2018-08-23 MED ORDER — METHYLPREDNISOLONE ACETATE 80 MG/ML IJ SUSP
80.0000 mg | Freq: Once | INTRAMUSCULAR | Status: AC
  Administered 2018-08-23: 80 mg via INTRALESIONAL

## 2018-08-23 NOTE — Assessment & Plan Note (Signed)
Skin bx Steroid inj - see procedure

## 2018-08-23 NOTE — Progress Notes (Signed)
Subjective:  Patient ID: Curtis Jacobs, male    DOB: 06-Dec-1932  Age: 85 y.o. MRN: [id number]  CC: No chief complaint on file.   HPI [name] presents for a rash on B buttocks x 2 plus years, very painful...  Outpatient Medications Prior to Visit  Medication Sig Dispense Refill  . Cholecalciferol 1000 UNITS tablet Take 2,000 Units by mouth daily.     . clotrimazole-betamethasone (LOTRISONE) cream Apply 1 application topically 2 (two) times daily. 30 g 1  . fluocinonide cream (LIDEX) 0.05 % Apply 1 application topically daily.    . Glucosamine-Chondroit-Vit C-Mn (GLUCOSAMINE CHONDROITIN COMPLX) CAPS Take 1 capsule by mouth daily.      Marland Kitchen guaiFENesin (MUCINEX) 600 MG 12 hr tablet Take 2 tablets (1,200 mg total) by mouth 2 (two) times daily.    Marland Kitchen loratadine (CLARITIN) 10 MG tablet Take 10 mg by mouth daily.    . Multiple Vitamin (MULTIVITAMIN) tablet Take 1 tablet by mouth daily.      . niacin 500 MG CR capsule Take 500 mg by mouth at bedtime.      [name] POWD Take by mouth daily.     Marland Kitchen terazosin (HYTRIN) 10 MG capsule Take 1 capsule (10 mg total) by mouth at bedtime. 90 capsule 1  . vitamin C (ASCORBIC ACID) 500 MG tablet Take 500 mg by mouth daily.       Facility-Administered Medications Prior to Visit  Medication Dose Route Frequency Provider Last Rate Last Dose  . methylPREDNISolone acetate (DEPO-MEDROL) 20 MG/ML injection 20 mg  20 mg Intra-Lesional Once [name], MD      . methylPREDNISolone acetate (DEPO-MEDROL) injection 40 mg  40 mg Intra-articular Once [name], MD      . methylPREDNISolone acetate (DEPO-MEDROL) injection 40 mg  40 mg Intra-articular Once [name], [name], MD        ROS: Review of Systems  Constitutional: Positive for fever.  Skin: Positive for color change, rash and wound.    Objective:  BP 140/72 (BP Location: Left Arm, Patient Position: Sitting, Cuff Size: Normal)   Pulse 77   Temp 98.2 F (36.8 C) (Oral)    Ht 5\' 10"  (1.778 m)   Wt 200 lb (90.7 kg)   SpO2 97%   BMI 28.70 kg/m   BP Readings from Last 3 Encounters:  08/23/18 140/72  07/28/18 (!) 124/58  01/27/18 136/64    Wt Readings from Last 3 Encounters:  08/23/18 200 lb (90.7 kg)  07/28/18 200 lb (90.7 kg)  01/27/18 196 lb (88.9 kg)    Physical Exam Skin:    Findings: Lesion and rash present.  Neurological:     Mental Status: He is alert.     Procedure: steroid injection of chronic rash site 3x2 cm each Indication:pruritic lesion Risks (unsuccessful procedure, bleeding, infection, atrophy) as well as benefits were discussed.  The patient was placed in a decubitus position. After sterile prep the areas on B buttocks were injected with 40 mg DepoMedrol and 2 cc of Lidocaine mix each. Tolerated well. Complications: none. Bandaid applied.    Procedure Note :     Procedure :  Skin biopsy   Indication:  chronic rash site 3x2 cm R buttock   Risks including unsuccessful procedure , bleeding, infection, bruising, scar, a need for another complete procedure and others were explained to the patient in detail as well as the benefits. Informed consent was obtained.  The patient was placed in  a decubitus position.  Lesion #1 on   R buttock  measuring  6x5   mm   Skin over lesion #1  was prepped with Betadine and alcohol  and anesthetized with 1 cc of 2% lidocaine and epinephrine, using a 25-gauge 1 inch needle.  Shave biopsy with a sterile Dermablade was carried out in the usual fashion. Hyfrecator was used to destroy the rest of the lesion potentially left behind and for hemostasis. Band-Aid was applied with antibiotic ointment.    Postprocedure instructions :    A Band-Aid should be  changed twice daily. You can take a shower tomorrow.  Keep the wounds clean. You can wash them with liquid soap and water. Pat dry with gauze or a Kleenex tissue  Before applying antibiotic ointment and a Band-Aid.   You need to report immediately   if fever, chills or any signs of infection develop.    The biopsy results should be available in 1 -2 weeks.    Lab Results  Component Value Date   WBC 5.5 01/27/2018   HGB 14.0 01/27/2018   HCT 40.3 01/27/2018   PLT 169.0 01/27/2018   GLUCOSE 130 (H) 07/28/2018   CHOL 191 01/27/2018   TRIG 144.0 01/27/2018   HDL 36.20 (L) 01/27/2018   LDLDIRECT 101.0 01/10/2015   LDLCALC 126 (H) 01/27/2018   ALT 17 01/27/2018   AST 18 01/27/2018   NA 139 07/28/2018   K 3.9 07/28/2018   CL 107 07/28/2018   CREATININE 1.25 07/28/2018   BUN 17 07/28/2018   CO2 26 07/28/2018   TSH 0.55 07/28/2018   PSA 3.55 07/28/2018    Dg Chest 2 View  Result Date: 07/15/2017 CLINICAL DATA:  Chronic cough and congestion. History of pulmonary nodules or bronchiectasis. EXAM: CHEST  2 VIEW COMPARISON:  07/15/2015. FINDINGS: Mediastinum hilar structures normal. Heart size normal. No focal infiltrate. No pulmonary nodule noted on today's exam. Right apical pleural-parenchymal thickening again noted most consistent with scarring. Heart size normal. Thoracic spine scoliosis and degenerative change. Surgical clips right axilla. IMPRESSION: 1. Right apical pleural-parenchymal thickening consistent with scarring. 2.  No acute cardiopulmonary disease. Electronically Signed   By: Maisie Fus  Register   On: 07/15/2017 10:16    Assessment & Plan:   There are no diagnoses linked to this encounter.   No orders of the defined types were placed in this encounter.    Follow-up: No follow-ups on file.  Sonda Primes, MD

## 2018-08-23 NOTE — Patient Instructions (Signed)
Sebamed soap 

## 2018-10-04 DIAGNOSIS — H2512 Age-related nuclear cataract, left eye: Secondary | ICD-10-CM | POA: Diagnosis not present

## 2018-10-04 DIAGNOSIS — H52202 Unspecified astigmatism, left eye: Secondary | ICD-10-CM | POA: Diagnosis not present

## 2018-10-04 DIAGNOSIS — H25042 Posterior subcapsular polar age-related cataract, left eye: Secondary | ICD-10-CM | POA: Diagnosis not present

## 2018-10-04 DIAGNOSIS — H25812 Combined forms of age-related cataract, left eye: Secondary | ICD-10-CM | POA: Diagnosis not present

## 2018-10-17 ENCOUNTER — Other Ambulatory Visit: Payer: Self-pay | Admitting: Internal Medicine

## 2018-10-18 ENCOUNTER — Ambulatory Visit (INDEPENDENT_AMBULATORY_CARE_PROVIDER_SITE_OTHER): Payer: Medicare Other | Admitting: Internal Medicine

## 2018-10-18 ENCOUNTER — Encounter: Payer: Self-pay | Admitting: Internal Medicine

## 2018-10-18 VITALS — BP 136/74 | HR 75 | Temp 98.0°F | Ht 70.0 in | Wt 200.0 lb

## 2018-10-18 DIAGNOSIS — L309 Dermatitis, unspecified: Secondary | ICD-10-CM | POA: Diagnosis not present

## 2018-10-18 DIAGNOSIS — I10 Essential (primary) hypertension: Secondary | ICD-10-CM

## 2018-10-18 DIAGNOSIS — L989 Disorder of the skin and subcutaneous tissue, unspecified: Secondary | ICD-10-CM

## 2018-10-18 DIAGNOSIS — E785 Hyperlipidemia, unspecified: Secondary | ICD-10-CM

## 2018-10-18 NOTE — Assessment & Plan Note (Signed)
B buttocks - skin was injected

## 2018-10-18 NOTE — Assessment & Plan Note (Signed)
Hytrin 

## 2018-10-18 NOTE — Assessment & Plan Note (Signed)
Procedure: skin lesions steroid injection Reason: skin lesion, symptomatic Verbal consent was obtained.  Skin was cleaned with betadine and alcohol.  Skin lesions on B buttocks were injected with 40 mg of DepoMedrol and 2c 1% Lidocaine each.  Tolerated well Complications none Band-aids applied

## 2018-10-18 NOTE — Progress Notes (Signed)
Subjective:  Patient ID: Curtis Jacobs, male    DOB: [birth year]  Age: 85 y.o. MRN: [id number]  CC: No chief complaint on file.   HPI STEFAN MCFEELY presents for a rash, buttock lesions, allergies, BPH f/u  Outpatient Medications Prior to Visit  Medication Sig Dispense Refill  . Cholecalciferol 1000 UNITS tablet Take 2,000 Units by mouth daily.     . clotrimazole-betamethasone (LOTRISONE) cream Apply 1 application topically 2 (two) times daily. 30 g 1  . fluocinonide cream (LIDEX) 0.05 % Apply 1 application topically daily.    . Glucosamine-Chondroit-Vit C-Mn (GLUCOSAMINE CHONDROITIN COMPLX) CAPS Take 1 capsule by mouth daily.      Marland Kitchen guaiFENesin (MUCINEX) 600 MG 12 hr tablet Take 2 tablets (1,200 mg total) by mouth 2 (two) times daily.    Marland Kitchen loratadine (CLARITIN) 10 MG tablet Take 10 mg by mouth daily.    . Multiple Vitamin (MULTIVITAMIN) tablet Take 1 tablet by mouth daily.      . niacin 500 MG CR capsule Take 500 mg by mouth at bedtime.      [name] POWD Take by mouth daily.     Marland Kitchen terazosin (HYTRIN) 10 MG capsule TAKE ONE CAPSULE BY MOUTH EVERY NIGHT AT BEDTIME 90 capsule 3  . triamcinolone ointment (KENALOG) 0.1 % Apply 1 application topically 3 (three) times daily. 80 g 1  . vitamin C (ASCORBIC ACID) 500 MG tablet Take 500 mg by mouth daily.       Facility-Administered Medications Prior to Visit  Medication Dose Route Frequency Provider Last Rate Last Dose  . methylPREDNISolone acetate (DEPO-MEDROL) 20 MG/ML injection 20 mg  20 mg Intra-Lesional Once Carmilla Granville V, MD      . methylPREDNISolone acetate (DEPO-MEDROL) injection 40 mg  40 mg Intra-articular Once Fay Swider V, MD      . methylPREDNISolone acetate (DEPO-MEDROL) injection 40 mg  40 mg Intra-articular Once Sinclaire Artiga, [name], MD        ROS: Review of Systems  Constitutional: Negative for appetite change, fatigue and unexpected weight change.  HENT: Negative for congestion, nosebleeds, sneezing,  sore throat and trouble swallowing.   Eyes: Negative for itching and visual disturbance.  Respiratory: Negative for cough.   Cardiovascular: Negative for chest pain, palpitations and leg swelling.  Gastrointestinal: Negative for abdominal distention, blood in stool, diarrhea and nausea.  Genitourinary: Negative for frequency and hematuria.  Musculoskeletal: Negative for back pain, gait problem, joint swelling and neck pain.  Skin: Positive for rash and wound.  Neurological: Negative for dizziness, tremors, speech difficulty and weakness.  Psychiatric/Behavioral: Negative for agitation, dysphoric mood and sleep disturbance. The patient is not nervous/anxious.     Objective:  BP 136/74 (BP Location: Left Arm, Patient Position: Sitting, Cuff Size: Large)   Pulse 75   Temp 98 F (36.7 C) (Oral)   Ht 5\' 10"  (1.778 m)   Wt 200 lb (90.7 kg)   SpO2 99%   BMI 28.70 kg/m   BP Readings from Last 3 Encounters:  10/18/18 136/74  08/23/18 140/72  07/28/18 (!) 124/58    Wt Readings from Last 3 Encounters:  10/18/18 200 lb (90.7 kg)  08/23/18 200 lb (90.7 kg)  07/28/18 200 lb (90.7 kg)    Physical Exam Constitutional:      General: He is not in acute distress.    Appearance: He is well-developed.     Comments: NAD  Eyes:     Conjunctiva/sclera: Conjunctivae normal.  Pupils: Pupils are equal, round, and reactive to light.  Neck:     Musculoskeletal: Normal range of motion.     Thyroid: No thyromegaly.     Vascular: No JVD.  Cardiovascular:     Rate and Rhythm: Normal rate and regular rhythm.     Heart sounds: Normal heart sounds. No murmur. No friction rub. No gallop.   Pulmonary:     Effort: Pulmonary effort is normal. No respiratory distress.     Breath sounds: Normal breath sounds. No wheezing or rales.  Chest:     Chest wall: No tenderness.  Abdominal:     General: Bowel sounds are normal. There is no distension.     Palpations: Abdomen is soft. There is no mass.      Tenderness: There is no abdominal tenderness. There is no guarding or rebound.  Musculoskeletal: Normal range of motion.        General: No tenderness.  Lymphadenopathy:     Cervical: No cervical adenopathy.  Skin:    General: Skin is warm and dry.     Findings: Erythema and rash present.  Neurological:     Mental Status: He is alert and oriented to person, place, and time.     Cranial Nerves: No cranial nerve deficit.     Motor: No abnormal muscle tone.     Coordination: Coordination normal.     Gait: Gait normal.     Deep Tendon Reflexes: Reflexes are normal and symmetric.  Psychiatric:        Behavior: Behavior normal.        Thought Content: Thought content normal.        Judgment: Judgment normal.   R buttock ulcer, rash better L buttock rash - better   Procedure: skin lesion steroid injection Reason: skin lesion, symptomatic Verbal consent was obtained.  Skin was cleaned with betadine and alcohol.  Skin lesions on B buttocks were injected with 40 mg of DepoMedrol and 2c 1% Lidocaine each.  Tolerated well Complications none Band-aids applied   Lab Results  Component Value Date   WBC 5.5 01/27/2018   HGB 14.0 01/27/2018   HCT 40.3 01/27/2018   PLT 169.0 01/27/2018   GLUCOSE 130 (H) 07/28/2018   CHOL 191 01/27/2018   TRIG 144.0 01/27/2018   HDL 36.20 (L) 01/27/2018   LDLDIRECT 101.0 01/10/2015   LDLCALC 126 (H) 01/27/2018   ALT 17 01/27/2018   AST 18 01/27/2018   NA 139 07/28/2018   K 3.9 07/28/2018   CL 107 07/28/2018   CREATININE 1.25 07/28/2018   BUN 17 07/28/2018   CO2 26 07/28/2018   TSH 0.55 07/28/2018   PSA 3.55 07/28/2018    Dg Chest 2 View  Result Date: 07/15/2017 CLINICAL DATA:  Chronic cough and congestion. History of pulmonary nodules or bronchiectasis. EXAM: CHEST  2 VIEW COMPARISON:  07/15/2015. FINDINGS: Mediastinum hilar structures normal. Heart size normal. No focal infiltrate. No pulmonary nodule noted on today's exam. Right apical  pleural-parenchymal thickening again noted most consistent with scarring. Heart size normal. Thoracic spine scoliosis and degenerative change. Surgical clips right axilla. IMPRESSION: 1. Right apical pleural-parenchymal thickening consistent with scarring. 2.  No acute cardiopulmonary disease. Electronically Signed   By: Maisie Fus  Register   On: 07/15/2017 10:16    Assessment & Plan:   There are no diagnoses linked to this encounter.   No orders of the defined types were placed in this encounter.    Follow-up: No follow-ups on file.  Sonda Primes, MD

## 2018-10-18 NOTE — Assessment & Plan Note (Signed)
He declined statins

## 2018-10-20 ENCOUNTER — Ambulatory Visit: Payer: Medicare Other | Admitting: Internal Medicine

## 2018-10-26 DIAGNOSIS — M533 Sacrococcygeal disorders, not elsewhere classified: Secondary | ICD-10-CM | POA: Diagnosis not present

## 2018-12-20 ENCOUNTER — Ambulatory Visit: Payer: Medicare Other | Admitting: Internal Medicine

## 2019-01-24 DIAGNOSIS — M533 Sacrococcygeal disorders, not elsewhere classified: Secondary | ICD-10-CM | POA: Diagnosis not present

## 2019-02-02 ENCOUNTER — Other Ambulatory Visit (INDEPENDENT_AMBULATORY_CARE_PROVIDER_SITE_OTHER): Payer: Medicare Other

## 2019-02-02 ENCOUNTER — Ambulatory Visit (INDEPENDENT_AMBULATORY_CARE_PROVIDER_SITE_OTHER): Payer: Medicare Other | Admitting: Internal Medicine

## 2019-02-02 ENCOUNTER — Other Ambulatory Visit: Payer: Self-pay

## 2019-02-02 ENCOUNTER — Encounter: Payer: Self-pay | Admitting: Internal Medicine

## 2019-02-02 VITALS — BP 142/76 | HR 84 | Temp 97.6°F | Ht 70.0 in | Wt 195.0 lb

## 2019-02-02 DIAGNOSIS — E785 Hyperlipidemia, unspecified: Secondary | ICD-10-CM

## 2019-02-02 DIAGNOSIS — L989 Disorder of the skin and subcutaneous tissue, unspecified: Secondary | ICD-10-CM

## 2019-02-02 DIAGNOSIS — M5442 Lumbago with sciatica, left side: Secondary | ICD-10-CM

## 2019-02-02 DIAGNOSIS — I10 Essential (primary) hypertension: Secondary | ICD-10-CM

## 2019-02-02 DIAGNOSIS — J479 Bronchiectasis, uncomplicated: Secondary | ICD-10-CM

## 2019-02-02 DIAGNOSIS — R739 Hyperglycemia, unspecified: Secondary | ICD-10-CM

## 2019-02-02 DIAGNOSIS — M5441 Lumbago with sciatica, right side: Secondary | ICD-10-CM

## 2019-02-02 DIAGNOSIS — G8929 Other chronic pain: Secondary | ICD-10-CM

## 2019-02-02 LAB — BASIC METABOLIC PANEL
BUN: 15 mg/dL (ref 6–23)
CO2: 29 mEq/L (ref 19–32)
Calcium: 9.7 mg/dL (ref 8.4–10.5)
Chloride: 105 mEq/L (ref 96–112)
Creatinine, Ser: 1.2 mg/dL (ref 0.40–1.50)
GFR: 57.74 mL/min — ABNORMAL LOW (ref >60.00)
Glucose, Bld: 102 mg/dL — ABNORMAL HIGH (ref 70–99)
Potassium: 3.7 mEq/L (ref 3.5–5.1)
Sodium: 141 mEq/L (ref 135–145)

## 2019-02-02 LAB — CBC WITH DIFFERENTIAL/PLATELET
Basophils Absolute: 0 10*3/uL (ref 0.0–0.1)
Basophils Relative: 0.8 % (ref 0.0–3.0)
Eosinophils Absolute: 0.1 10*3/uL (ref 0.0–0.7)
Eosinophils Relative: 1.7 % (ref 0.0–5.0)
HCT: 42.6 % (ref 39.0–52.0)
Hemoglobin: 14.4 g/dL (ref 13.0–17.0)
Lymphocytes Relative: 27.8 % (ref 12.0–46.0)
Lymphs Abs: 1.5 10*3/uL (ref 0.7–4.0)
MCHC: 33.8 g/dL (ref 30.0–36.0)
MCV: 93.7 fl (ref 78.0–100.0)
Monocytes Absolute: 0.4 10*3/uL (ref 0.1–1.0)
Monocytes Relative: 7.7 % (ref 3.0–12.0)
Neutro Abs: 3.3 10*3/uL (ref 1.4–7.7)
Neutrophils Relative %: 62 % (ref 43.0–77.0)
Platelets: 177 10*3/uL (ref 150.0–400.0)
RBC: 4.55 Mil/uL (ref 4.22–5.81)
RDW: 13.3 % (ref 11.5–15.5)
WBC: 5.3 10*3/uL (ref 4.0–10.5)

## 2019-02-02 LAB — HEMOGLOBIN A1C: Hgb A1c MFr Bld: 5.8 % (ref 4.6–6.5)

## 2019-02-02 NOTE — Progress Notes (Signed)
Vernona Rieger stool blood possible   Subjective:  Patient ID: Curtis Jacobs, male    DOB: 1932-12-04  Age: 85 y.o. MRN: [id number]  CC: No chief complaint on file.   HPI Curtis Jacobs presents for buttock rash, itching Using EmuaidMax on skin - Argentum silver    Outpatient Medications Prior to Visit  Medication Sig Dispense Refill  . Cholecalciferol 1000 UNITS tablet Take 2,000 Units by mouth daily.     . clotrimazole-betamethasone (LOTRISONE) cream Apply 1 application topically 2 (two) times daily. 30 g 1  . fluocinonide cream (LIDEX) 0.05 % Apply 1 application topically daily.    . Glucosamine-Chondroit-Vit C-Mn (GLUCOSAMINE CHONDROITIN COMPLX) CAPS Take 1 capsule by mouth daily.      Marland Kitchen guaiFENesin (MUCINEX) 600 MG 12 hr tablet Take 2 tablets (1,200 mg total) by mouth 2 (two) times daily.    Marland Kitchen loratadine (CLARITIN) 10 MG tablet Take 10 mg by mouth daily.    . Multiple Vitamin (MULTIVITAMIN) tablet Take 1 tablet by mouth daily.      . niacin 500 MG CR capsule Take 500 mg by mouth at bedtime.      [name] POWD Take by mouth daily.     Marland Kitchen terazosin (HYTRIN) 10 MG capsule TAKE ONE CAPSULE BY MOUTH EVERY NIGHT AT BEDTIME 90 capsule 3  . triamcinolone ointment (KENALOG) 0.1 % Apply 1 application topically 3 (three) times daily. 80 g 1  . vitamin C (ASCORBIC ACID) 500 MG tablet Take 500 mg by mouth daily.       Facility-Administered Medications Prior to Visit  Medication Dose Route Frequency Provider Last Rate Last Dose  . methylPREDNISolone acetate (DEPO-MEDROL) 20 MG/ML injection 20 mg  20 mg Intra-Lesional Once Farrell Broerman V, MD      . methylPREDNISolone acetate (DEPO-MEDROL) injection 40 mg  40 mg Intra-articular Once Kyle Stansell V, MD      . methylPREDNISolone acetate (DEPO-MEDROL) injection 40 mg  40 mg Intra-articular Once Dhyan Noah, [name], MD        ROS: Review of Systems  Constitutional: Negative for appetite change, fatigue and unexpected weight change.   HENT: Negative for congestion, nosebleeds, sneezing, sore throat and trouble swallowing.   Eyes: Negative for itching and visual disturbance.  Respiratory: Negative for cough.   Cardiovascular: Negative for chest pain, palpitations and leg swelling.  Gastrointestinal: Negative for abdominal distention, blood in stool, diarrhea and nausea.  Genitourinary: Negative for frequency and hematuria.  Musculoskeletal: Negative for back pain, gait problem, joint swelling and neck pain.  Skin: Positive for color change, rash and wound.  Neurological: Negative for dizziness, tremors, speech difficulty and weakness.  Psychiatric/Behavioral: Negative for agitation, dysphoric mood, sleep disturbance and suicidal ideas. The patient is not nervous/anxious.     Objective:  BP (!) 142/76 (BP Location: Left Arm, Patient Position: Sitting, Cuff Size: Large)   Pulse 84   Temp 97.6 F (36.4 C) (Oral)   Ht 5\' 10"  (1.778 m)   Wt 195 lb (88.5 kg)   SpO2 97%   BMI 27.98 kg/m   BP Readings from Last 3 Encounters:  02/02/19 (!) 142/76  10/18/18 136/74  08/23/18 140/72    Wt Readings from Last 3 Encounters:  02/02/19 195 lb (88.5 kg)  10/18/18 200 lb (90.7 kg)  08/23/18 200 lb (90.7 kg)    Physical Exam Constitutional:      General: He is not in acute distress.    Appearance: He is well-developed.  Comments: NAD  Eyes:     Conjunctiva/sclera: Conjunctivae normal.     Pupils: Pupils are equal, round, and reactive to light.  Neck:     Musculoskeletal: Normal range of motion.     Thyroid: No thyromegaly.     Vascular: No JVD.  Cardiovascular:     Rate and Rhythm: Normal rate and regular rhythm.     Heart sounds: Normal heart sounds. No murmur. No friction rub. No gallop.   Pulmonary:     Effort: Pulmonary effort is normal. No respiratory distress.     Breath sounds: Normal breath sounds. No wheezing or rales.  Chest:     Chest wall: No tenderness.  Abdominal:     General: Bowel sounds are  normal. There is no distension.     Palpations: Abdomen is soft. There is no mass.     Tenderness: There is no abdominal tenderness. There is no guarding or rebound.  Musculoskeletal: Normal range of motion.        General: No tenderness.  Lymphadenopathy:     Cervical: No cervical adenopathy.  Skin:    General: Skin is warm and dry.     Findings: Erythema, lesion and rash present.  Neurological:     Mental Status: He is alert and oriented to person, place, and time.     Cranial Nerves: No cranial nerve deficit.     Motor: No abnormal muscle tone.     Coordination: Coordination normal.     Gait: Gait normal.     Deep Tendon Reflexes: Reflexes are normal and symmetric.  Psychiatric:        Behavior: Behavior normal.        Thought Content: Thought content normal.        Judgment: Judgment normal.   buttock rash is better  Lab Results  Component Value Date   WBC 5.5 01/27/2018   HGB 14.0 01/27/2018   HCT 40.3 01/27/2018   PLT 169.0 01/27/2018   GLUCOSE 130 (H) 07/28/2018   CHOL 191 01/27/2018   TRIG 144.0 01/27/2018   HDL 36.20 (L) 01/27/2018   LDLDIRECT 101.0 01/10/2015   LDLCALC 126 (H) 01/27/2018   ALT 17 01/27/2018   AST 18 01/27/2018   NA 139 07/28/2018   K 3.9 07/28/2018   CL 107 07/28/2018   CREATININE 1.25 07/28/2018   BUN 17 07/28/2018   CO2 26 07/28/2018   TSH 0.55 07/28/2018   PSA 3.55 07/28/2018    Dg Chest 2 View  Result Date: 07/15/2017 CLINICAL DATA:  Chronic cough and congestion. History of pulmonary nodules or bronchiectasis. EXAM: CHEST  2 VIEW COMPARISON:  07/15/2015. FINDINGS: Mediastinum hilar structures normal. Heart size normal. No focal infiltrate. No pulmonary nodule noted on today's exam. Right apical pleural-parenchymal thickening again noted most consistent with scarring. Heart size normal. Thoracic spine scoliosis and degenerative change. Surgical clips right axilla. IMPRESSION: 1. Right apical pleural-parenchymal thickening consistent  with scarring. 2.  No acute cardiopulmonary disease. Electronically Signed   By: Maisie Fus  Register   On: 07/15/2017 10:16    Assessment & Plan:   There are no diagnoses linked to this encounter.   No orders of the defined types were placed in this encounter.    Follow-up: No follow-ups on file.  Sonda Primes, MD will sleep left Lovenox

## 2019-02-02 NOTE — Assessment & Plan Note (Signed)
Using EmuaidMax on skin - Argentum silver - better

## 2019-02-02 NOTE — Assessment & Plan Note (Signed)
Doing well 

## 2019-02-02 NOTE — Assessment & Plan Note (Signed)
Declined statins. 

## 2019-02-02 NOTE — Assessment & Plan Note (Signed)
BP Readings from Last 3 Encounters:  02/02/19 (!) 142/76  10/18/18 136/74  08/23/18 140/72

## 2019-02-07 DIAGNOSIS — D1801 Hemangioma of skin and subcutaneous tissue: Secondary | ICD-10-CM | POA: Diagnosis not present

## 2019-02-07 DIAGNOSIS — L812 Freckles: Secondary | ICD-10-CM | POA: Diagnosis not present

## 2019-02-07 DIAGNOSIS — L821 Other seborrheic keratosis: Secondary | ICD-10-CM | POA: Diagnosis not present

## 2019-02-07 DIAGNOSIS — L308 Other specified dermatitis: Secondary | ICD-10-CM | POA: Diagnosis not present

## 2019-02-07 DIAGNOSIS — Z85828 Personal history of other malignant neoplasm of skin: Secondary | ICD-10-CM | POA: Diagnosis not present

## 2019-02-21 DIAGNOSIS — H25811 Combined forms of age-related cataract, right eye: Secondary | ICD-10-CM | POA: Diagnosis not present

## 2019-02-21 DIAGNOSIS — H2511 Age-related nuclear cataract, right eye: Secondary | ICD-10-CM | POA: Diagnosis not present

## 2019-02-21 DIAGNOSIS — H52201 Unspecified astigmatism, right eye: Secondary | ICD-10-CM | POA: Diagnosis not present

## 2019-02-22 ENCOUNTER — Ambulatory Visit: Payer: Medicare Other | Admitting: Internal Medicine

## 2019-03-21 DIAGNOSIS — T8522XA Displacement of intraocular lens, initial encounter: Secondary | ICD-10-CM | POA: Diagnosis not present

## 2019-05-10 DIAGNOSIS — Z961 Presence of intraocular lens: Secondary | ICD-10-CM | POA: Diagnosis not present

## 2019-05-11 DIAGNOSIS — M533 Sacrococcygeal disorders, not elsewhere classified: Secondary | ICD-10-CM | POA: Diagnosis not present

## 2019-05-11 DIAGNOSIS — M47812 Spondylosis without myelopathy or radiculopathy, cervical region: Secondary | ICD-10-CM | POA: Diagnosis not present

## 2019-05-23 DIAGNOSIS — M47812 Spondylosis without myelopathy or radiculopathy, cervical region: Secondary | ICD-10-CM | POA: Diagnosis not present

## 2019-08-02 DIAGNOSIS — D0321 Melanoma in situ of right ear and external auricular canal: Secondary | ICD-10-CM | POA: Diagnosis not present

## 2019-08-02 DIAGNOSIS — Z85828 Personal history of other malignant neoplasm of skin: Secondary | ICD-10-CM | POA: Diagnosis not present

## 2019-08-02 DIAGNOSIS — D1801 Hemangioma of skin and subcutaneous tissue: Secondary | ICD-10-CM | POA: Diagnosis not present

## 2019-08-02 DIAGNOSIS — L570 Actinic keratosis: Secondary | ICD-10-CM | POA: Diagnosis not present

## 2019-08-02 DIAGNOSIS — L308 Other specified dermatitis: Secondary | ICD-10-CM | POA: Diagnosis not present

## 2019-08-02 DIAGNOSIS — L812 Freckles: Secondary | ICD-10-CM | POA: Diagnosis not present

## 2019-08-02 DIAGNOSIS — L821 Other seborrheic keratosis: Secondary | ICD-10-CM | POA: Diagnosis not present

## 2019-08-08 ENCOUNTER — Other Ambulatory Visit: Payer: Medicare Other

## 2019-08-08 ENCOUNTER — Ambulatory Visit (INDEPENDENT_AMBULATORY_CARE_PROVIDER_SITE_OTHER)
Admission: RE | Admit: 2019-08-08 | Discharge: 2019-08-08 | Disposition: A | Discharge status: Home / Self Care | Payer: Medicare Other | Source: Ambulatory Visit | Attending: Internal Medicine | Admitting: Internal Medicine

## 2019-08-08 ENCOUNTER — Ambulatory Visit (INDEPENDENT_AMBULATORY_CARE_PROVIDER_SITE_OTHER): Payer: Medicare Other | Admitting: Internal Medicine

## 2019-08-08 ENCOUNTER — Other Ambulatory Visit: Payer: Self-pay

## 2019-08-08 ENCOUNTER — Encounter: Payer: Self-pay | Admitting: Internal Medicine

## 2019-08-08 ENCOUNTER — Ambulatory Visit: Payer: Medicare Other

## 2019-08-08 VITALS — BP 164/96 | HR 74 | Temp 98.3°F | Ht 70.0 in | Wt 202.0 lb

## 2019-08-08 DIAGNOSIS — C4321 Malignant melanoma of right ear and external auricular canal: Secondary | ICD-10-CM | POA: Diagnosis not present

## 2019-08-08 DIAGNOSIS — R0989 Other specified symptoms and signs involving the circulatory and respiratory systems: Secondary | ICD-10-CM

## 2019-08-08 DIAGNOSIS — R972 Elevated prostate specific antigen [PSA]: Secondary | ICD-10-CM | POA: Diagnosis not present

## 2019-08-08 DIAGNOSIS — J479 Bronchiectasis, uncomplicated: Secondary | ICD-10-CM

## 2019-08-08 DIAGNOSIS — Z0000 Encounter for general adult medical examination without abnormal findings: Secondary | ICD-10-CM

## 2019-08-08 DIAGNOSIS — C4320 Malignant melanoma of unspecified ear and external auricular canal: Secondary | ICD-10-CM | POA: Insufficient documentation

## 2019-08-08 DIAGNOSIS — E785 Hyperlipidemia, unspecified: Secondary | ICD-10-CM

## 2019-08-08 DIAGNOSIS — R918 Other nonspecific abnormal finding of lung field: Secondary | ICD-10-CM | POA: Diagnosis not present

## 2019-08-08 DIAGNOSIS — I10 Essential (primary) hypertension: Secondary | ICD-10-CM

## 2019-08-08 NOTE — Assessment & Plan Note (Signed)
Duplex US

## 2019-08-08 NOTE — Progress Notes (Signed)
Subjective:  Patient ID: Curtis Jacobs, male    DOB: [birth year]  Age: 85 y.o. MRN: [id number]  CC: No chief complaint on file.   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 Cholecalciferol 1000 UNITS tablet Take 2,000 Units by mouth daily.     . fluocinonide cream (LIDEX) 0.05 % Apply 1 application topically daily.    . Glucosamine-Chondroit-Vit C-Mn (GLUCOSAMINE CHONDROITIN COMPLX) CAPS Take 1 capsule by mouth daily.      Marland Kitchen guaiFENesin (MUCINEX) 600 MG 12 hr tablet Take 2 tablets (1,200 mg total) by mouth 2 (two) times daily.    Marland Kitchen loratadine (CLARITIN) 10 MG tablet Take 10 mg by mouth daily.    . Multiple Vitamin (MULTIVITAMIN) tablet Take 1 tablet by mouth daily.      . niacin 500 MG CR capsule Take 500 mg by mouth at bedtime.      [name] POWD Take by mouth daily.     Marland Kitchen terazosin (HYTRIN) 10 MG capsule TAKE ONE CAPSULE BY MOUTH EVERY NIGHT AT BEDTIME 90 capsule 3  . triamcinolone ointment (KENALOG) 0.1 % Apply 1 application topically 3 (three) times daily. 80 g 1  . vitamin C (ASCORBIC ACID) 500 MG tablet Take 500 mg by mouth daily.       Facility-Administered Medications Prior to Visit  Medication Dose Route Frequency Provider Last Rate Last Admin  . methylPREDNISolone acetate (DEPO-MEDROL) 20 MG/ML injection 20 mg  20 mg Intra-Lesional Once Lillionna Nabi V, MD      . methylPREDNISolone acetate (DEPO-MEDROL) injection 40 mg  40 mg Intra-articular Once Ein Rijo V, MD      . methylPREDNISolone acetate (DEPO-MEDROL) injection 40 mg  40 mg Intra-articular Once Christophere Hillhouse, [name], MD        ROS: Review of Systems  Constitutional: Negative for appetite change, fatigue and unexpected weight change.  HENT: Negative for congestion, nosebleeds, sneezing, sore throat and trouble swallowing.   Eyes: Negative for itching and visual disturbance.    Respiratory: Negative for cough.   Cardiovascular: Negative for chest pain, palpitations and leg swelling.  Gastrointestinal: Negative for abdominal distention, blood in stool, diarrhea and nausea.  Genitourinary: Negative for frequency and hematuria.  Musculoskeletal: Negative for back pain, gait problem, joint swelling and neck pain.  Skin: Positive for rash.  Neurological: Negative for dizziness, tremors, speech difficulty and weakness.  Psychiatric/Behavioral: Negative for agitation, dysphoric mood, sleep disturbance and suicidal ideas. The patient is not nervous/anxious.     Objective:  BP (!) 164/96 (BP Location: Left Arm, Patient Position: Sitting, Cuff Size: Normal)   Pulse 74   Temp 98.3 F (36.8 C) (Oral)   Ht 5\' 10"  (1.778 m)   Wt 202 lb (91.6 kg)   SpO2 97%   BMI 28.98 kg/m   BP Readings from Last 3 Encounters:  08/08/19 (!) 164/96  02/02/19 (!) 142/76  10/18/18 136/74    Wt Readings from Last 3 Encounters:  08/08/19 202 lb (91.6 kg)  02/02/19 195 lb (88.5 kg)  10/18/18 200 lb (90.7 kg)    Physical Exam Constitutional:      General: He is not in acute distress.    Appearance: He is well-developed.     Comments: NAD  Eyes:     Conjunctiva/sclera: Conjunctivae normal.     Pupils: Pupils are equal, round,  and reactive to light.  Neck:     Thyroid: No thyromegaly.     Vascular: No JVD.  Cardiovascular:     Rate and Rhythm: Normal rate and regular rhythm.     Heart sounds: Normal heart sounds. No murmur. No friction rub. No gallop.   Pulmonary:     Effort: Pulmonary effort is normal. No respiratory distress.     Breath sounds: Normal breath sounds. No wheezing or rales.  Chest:     Chest wall: No tenderness.  Abdominal:     General: Bowel sounds are normal. There is no distension.     Palpations: Abdomen is soft. There is no mass.     Tenderness: There is no abdominal tenderness. There is no guarding or rebound.  Musculoskeletal:        General: No  tenderness. Normal range of motion.     Cervical back: Normal range of motion.  Lymphadenopathy:     Cervical: No cervical adenopathy.  Skin:    General: Skin is warm and dry.     Findings: Erythema and lesion present. No rash.  Neurological:     Mental Status: He is alert and oriented to person, place, and time.     Cranial Nerves: No cranial nerve deficit.     Motor: No abnormal muscle tone.     Coordination: Coordination normal.     Gait: Gait normal.     Deep Tendon Reflexes: Reflexes are normal and symmetric.  Psychiatric:        Behavior: Behavior normal.        Thought Content: Thought content normal.        Judgment: Judgment normal.   chronic rash on buttocks R ear melanoma small scar Mild bruit  Lab Results  Component Value Date   WBC 5.3 02/02/2019   HGB 14.4 02/02/2019   HCT 42.6 02/02/2019   PLT 177.0 02/02/2019   GLUCOSE 102 (H) 02/02/2019   CHOL 191 01/27/2018   TRIG 144.0 01/27/2018   HDL 36.20 (L) 01/27/2018   LDLDIRECT 101.0 01/10/2015   LDLCALC 126 (H) 01/27/2018   ALT 17 01/27/2018   AST 18 01/27/2018   NA 141 02/02/2019   K 3.7 02/02/2019   CL 105 02/02/2019   CREATININE 1.20 02/02/2019   BUN 15 02/02/2019   CO2 29 02/02/2019   TSH 0.55 07/28/2018   PSA 3.55 07/28/2018   HGBA1C 5.8 02/02/2019    DG Chest 2 View  Result Date: 07/15/2017 CLINICAL DATA:  Chronic cough and congestion. History of pulmonary nodules or bronchiectasis. EXAM: CHEST  2 VIEW COMPARISON:  07/15/2015. FINDINGS: Mediastinum hilar structures normal. Heart size normal. No focal infiltrate. No pulmonary nodule noted on today's exam. Right apical pleural-parenchymal thickening again noted most consistent with scarring. Heart size normal. Thoracic spine scoliosis and degenerative change. Surgical clips right axilla. IMPRESSION: 1. Right apical pleural-parenchymal thickening consistent with scarring. 2.  No acute cardiopulmonary disease. Electronically Signed   By: Maisie Fus  Register    On: 07/15/2017 10:16    Assessment & Plan:   There are no diagnoses linked to this encounter.   No orders of the defined types were placed in this encounter.    Follow-up: No follow-ups on file.  Sonda Primes, MD

## 2019-08-08 NOTE — Assessment & Plan Note (Signed)
Labs

## 2019-08-08 NOTE — Assessment & Plan Note (Addendum)
BP Readings from Last 3 Encounters:  08/08/19 (!) 164/96  02/02/19 (!) 142/76  10/18/18 136/74   Nl BP at home

## 2019-08-08 NOTE — Assessment & Plan Note (Signed)
PSA

## 2019-08-08 NOTE — Assessment & Plan Note (Signed)
F/u w/Pulmonology 

## 2019-08-08 NOTE — Assessment & Plan Note (Signed)
We discussed age appropriate health related issues, including available/recomended screening tests and vaccinations. We discussed a need for adhering to healthy diet and exercise. Labs were ordered to be later reviewed . All questions were answered.   

## 2019-08-08 NOTE — Assessment & Plan Note (Signed)
a new melanoma on R ear - another procedure is pending

## 2019-08-09 NOTE — Addendum Note (Signed)
Addended by: Aviva Signs M on: 08/09/2019 10:07 AM   Modules accepted: Orders

## 2019-08-15 ENCOUNTER — Other Ambulatory Visit: Payer: Self-pay

## 2019-08-15 ENCOUNTER — Ambulatory Visit (HOSPITAL_COMMUNITY)
Admission: RE | Admit: 2019-08-15 | Discharge: 2019-08-15 | Disposition: A | Discharge status: Home / Self Care | Payer: Medicare Other | Source: Ambulatory Visit | Attending: Internal Medicine | Admitting: Internal Medicine

## 2019-08-15 DIAGNOSIS — R0989 Other specified symptoms and signs involving the circulatory and respiratory systems: Secondary | ICD-10-CM | POA: Diagnosis not present

## 2019-08-28 DIAGNOSIS — D0322 Melanoma in situ of left ear and external auricular canal: Secondary | ICD-10-CM | POA: Diagnosis not present

## 2019-08-28 DIAGNOSIS — D0321 Melanoma in situ of right ear and external auricular canal: Secondary | ICD-10-CM | POA: Diagnosis not present

## 2019-09-13 DIAGNOSIS — M47812 Spondylosis without myelopathy or radiculopathy, cervical region: Secondary | ICD-10-CM | POA: Diagnosis not present

## 2019-09-27 DIAGNOSIS — M533 Sacrococcygeal disorders, not elsewhere classified: Secondary | ICD-10-CM | POA: Diagnosis not present

## 2019-10-12 ENCOUNTER — Other Ambulatory Visit: Payer: Self-pay | Admitting: Internal Medicine

## 2019-12-08 ENCOUNTER — Other Ambulatory Visit: Payer: Self-pay | Admitting: Internal Medicine

## 2020-02-06 ENCOUNTER — Encounter: Payer: Self-pay | Admitting: Internal Medicine

## 2020-02-06 ENCOUNTER — Ambulatory Visit (INDEPENDENT_AMBULATORY_CARE_PROVIDER_SITE_OTHER): Payer: Medicare Other | Admitting: Internal Medicine

## 2020-02-06 ENCOUNTER — Other Ambulatory Visit: Payer: Self-pay

## 2020-02-06 DIAGNOSIS — M47812 Spondylosis without myelopathy or radiculopathy, cervical region: Secondary | ICD-10-CM | POA: Diagnosis not present

## 2020-02-06 DIAGNOSIS — N401 Enlarged prostate with lower urinary tract symptoms: Secondary | ICD-10-CM

## 2020-02-06 DIAGNOSIS — E785 Hyperlipidemia, unspecified: Secondary | ICD-10-CM

## 2020-02-06 DIAGNOSIS — I10 Essential (primary) hypertension: Secondary | ICD-10-CM

## 2020-02-06 DIAGNOSIS — L309 Dermatitis, unspecified: Secondary | ICD-10-CM

## 2020-02-06 DIAGNOSIS — M1990 Unspecified osteoarthritis, unspecified site: Secondary | ICD-10-CM

## 2020-02-06 DIAGNOSIS — L989 Disorder of the skin and subcutaneous tissue, unspecified: Secondary | ICD-10-CM | POA: Diagnosis not present

## 2020-02-06 MED ORDER — METHYLPREDNISOLONE 4 MG PO TBPK: ORAL_TABLET | ORAL | 0 refills | Status: DC

## 2020-02-06 MED ORDER — FLURANDRENOLIDE 4 MCG/SQCM EX TAPE: 1.0000 | MEDICATED_TAPE | Freq: Two times a day (BID) | CUTANEOUS | 3 refills | Status: DC

## 2020-02-06 NOTE — Assessment & Plan Note (Signed)
Worse Gluten free diet trial Cordran Medrol pack

## 2020-02-06 NOTE — Assessment & Plan Note (Signed)
Hytrin 

## 2020-02-06 NOTE — Assessment & Plan Note (Signed)
Terazosyn 

## 2020-02-06 NOTE — Patient Instructions (Signed)
  Gluten free trial for 4-6 weeks. OK to use gluten-free bread and gluten-free pasta.    Gluten-Free Diet for Celiac Disease, Adult The gluten-free diet includes all foods that do not contain gluten. Gluten is a protein that is found in wheat, rye, barley, and some other grains. Following the gluten-free diet is the only treatment for people with celiac disease. It helps to prevent damage to the intestines and improves or eliminates the symptoms of celiac disease. Following the gluten-free diet requires some planning. It can be challenging at first, but it gets easier with time and practice. There are more gluten-free options available today than ever before. If you need help finding gluten-free foods or if you have questions, talk with your diet and nutrition specialist (registered dietitian) or your health care provider. What do I need to know about a gluten-free diet?  All fruits, vegetables, and meats are safe to eat and do not contain gluten.  When grocery shopping, start by shopping in the produce, meat, and dairy sections. These sections are more likely to contain gluten-free foods. Then move to the aisles that contain packaged foods if you need to.  Read all food labels. Gluten is often added to foods. Always check the ingredient list and look for warnings, such as "may contain gluten."  Talk with your dietitian or health care provider before taking a gluten-free multivitamin or mineral supplement.  Be aware of gluten-free foods having contact with foods that contain gluten (cross-contamination). This can happen at home and with any processed foods. ? Talk with your health care provider or dietitian about how to reduce the risk of cross-contamination in your home. ? If you have questions about how a food is processed, ask the manufacturer. What key words help to identify gluten? Foods that list any of these key words on the label usually contain gluten:  Wheat, flour, enriched  flour, bromated flour, white flour, durum flour, graham flour, phosphated flour, self-rising flour, semolina, farina, barley (malt), rye, and oats.  Starch, dextrin, modified food starch, or cereal.  Thickening, fillers, or emulsifiers.  Malt flavoring, malt extract, or malt syrup.  Hydrolyzed vegetable protein.  In the [place] packaged foods that are gluten-free are required to be labeled "GF." These foods should be easy to identify and are safe to eat. In the [place] food companies are also required to list common food allergens, including wheat, on their labels. Recommended foods Grains  Amaranth, bean flours, 100% buckwheat flour, corn, millet, nut flours or nut meals, GF oats, quinoa, rice, sorghum, teff, rice wafers, pure cornmeal tortillas, popcorn, and hot cereals made from cornmeal. Hominy, rice, wild rice. Some Asian rice noodles or bean noodles. Arrowroot starch, corn bran, corn flour, corn germ, cornmeal, corn starch, potato flour, potato starch flour, and rice bran. Plain, brown, and sweet rice flours. Rice polish, soy flour, and tapioca starch. Vegetables  All plain fresh, frozen, and canned vegetables. Fruits  All plain fresh, frozen, canned, and dried fruits, and 100% fruit juices. Meats and other protein foods  All fresh beef, pork, poultry, fish, seafood, and eggs. Fish canned in water, oil, brine, or vegetable broth. Plain nuts and seeds, peanut butter. Some lunch meat and some frankfurters. Dried beans, dried peas, and lentils. Dairy  Fresh plain, dry, evaporated, or condensed milk. Cream, butter, sour cream, whipping cream, and most yogurts. Unprocessed cheese, most processed cheeses, some cottage cheese, some cream cheeses. Beverages  Coffee, tea, most herbal teas. Carbonated beverages and some root beers.   Wine, sake, and distilled spirits, such as gin, vodka, and whiskey. Most hard ciders. Fats and oils  Butter, margarine, vegetable oil, hydrogenated butter, olive  oil, shortening, lard, cream, and some mayonnaise. Some commercial salad dressings. Olives. Sweets and desserts  Sugar, honey, some syrups, molasses, jelly, and jam. Plain hard candy, marshmallows, and gumdrops. Pure cocoa powder. Plain chocolate. Custard and some pudding mixes. Gelatin desserts, sorbets, frozen ice pops, and sherbet. Cake, cookies, and other desserts prepared with allowed flours. Some commercial ice creams. Cornstarch, tapioca, and rice puddings. Seasoning and other foods  Some canned or frozen soups. Monosodium glutamate (MSG). Cider, rice, and wine vinegar. Baking soda and baking powder. Cream of tartar. Baking and nutritional yeast. Certain soy sauces made without wheat (ask your dietitian about specific brands that are allowed). Nuts, coconut, and chocolate. Salt, pepper, herbs, spices, flavoring extracts, imitation or artificial flavorings, natural flavorings, and food colorings. Some medicines and supplements. Some lip glosses and other cosmetics. Rice syrups. The items listed may not be a complete list. Talk with your dietitian about what dietary choices are best for you. Foods to avoid Grains  Barley, bran, bulgur, couscous, cracked wheat, [place], farro, graham, malt, matzo, semolina, wheat germ, and all wheat and rye cereals including spelt and kamut. Cereals containing malt as a flavoring, such as rice cereal. Noodles, spaghetti, macaroni, most packaged rice mixes, and all mixes containing wheat, rye, barley, or triticale. Vegetables  Most creamed vegetables and most vegetables canned in sauces. Some commercially prepared vegetables and salads. Fruits  Thickened or prepared fruits and some pie fillings. Some fruit snacks and fruit roll-ups. Meats and other protein foods  Any meat or meat alternative containing wheat, rye, barley, or gluten stabilizers. These are often marinated or packaged meats and lunch meats. Bread-containing products, such as Swiss steak,  croquettes, meatballs, and meatloaf. Most tuna canned in vegetable broth and turkey with hydrolyzed vegetable protein (HVP) injected as part of the basting. Seitan. Imitation fish. Eggs in sauces made from ingredients to avoid. Dairy  Commercial chocolate milk drinks and malted milk. Some non-dairy creamers. Any cheese product containing ingredients to avoid. Beverages  Certain cereal beverages. Beer, ale, malted milk, and some root beers. Some hard ciders. Some instant flavored coffees. Some herbal teas made with barley or with barley malt added. Fats and oils  Some commercial salad dressings. Sour cream containing modified food starch. Sweets and desserts  Some toffees. Chocolate-coated nuts (may be rolled in wheat flour) and some commercial candies and candy bars. Most cakes, cookies, donuts, pastries, and other baked goods. Some commercial ice cream. Ice cream cones. Commercially prepared mixes for cakes, cookies, and other desserts. Bread pudding and other puddings thickened with flour. Products containing brown rice syrup made with barley malt enzyme. Desserts and sweets made with malt flavoring. Seasoning and other foods  Some curry powders, some dry seasoning mixes, some gravy extracts, some meat sauces, some ketchups, some prepared mustards, and horseradish. Certain soy sauces. Malt vinegar. Bouillon and bouillon cubes that contain HVP. Some chip dips, and some chewing gum. Yeast extract. Brewer's yeast. Caramel color. Some medicines and supplements. Some lip glosses and other cosmetics. The items listed may not be a complete list. Talk with your dietitian about what dietary choices are best for you. Summary  Gluten is a protein that is found in wheat, rye, barley, and some other grains. The gluten-free diet includes all foods that do not contain gluten.  If you need help finding gluten-free foods or if   you have questions, talk with your diet and nutrition specialist (registered  dietitian) or your health care provider.  Read all food labels. Gluten is often added to foods. Always check the ingredient list and look for warnings, such as "may contain gluten." This information is not intended to replace advice given to you by your health care provider. Make sure you discuss any questions you have with your health care provider. Document Released: 08/03/2005 Document Revised: 05/18/2016 Document Reviewed: 05/18/2016 Elsevier Interactive Patient Education  2018 [employer]   

## 2020-02-06 NOTE — Assessment & Plan Note (Addendum)
Chronic - worse Medrol pack Vit D Appt w/sports med today

## 2020-02-06 NOTE — Assessment & Plan Note (Addendum)
Declined statins Niacin

## 2020-02-06 NOTE — Progress Notes (Signed)
Subjective:  Patient ID: Curtis Jacobs, male    DOB: 11-Nov-1932  Age: 86 y.o. MRN: [id number]  CC: No chief complaint on file.   HPI Curtis Jacobs presents for chronic rash on buttocks - pain,itching F/u BPH, OA    Outpatient Medications Prior to Visit  Medication Sig Dispense Refill  . Cholecalciferol 1000 UNITS tablet Take 2,000 Units by mouth daily.     . fluocinonide cream (LIDEX) 0.05 % Apply 1 application topically daily.    . Glucosamine-Chondroit-Vit C-Mn (GLUCOSAMINE CHONDROITIN COMPLX) CAPS Take 1 capsule by mouth daily.      Marland Kitchen guaiFENesin (MUCINEX) 600 MG 12 hr tablet Take 2 tablets (1,200 mg total) by mouth 2 (two) times daily.    Marland Kitchen loratadine (CLARITIN) 10 MG tablet Take 10 mg by mouth daily.    . Multiple Vitamin (MULTIVITAMIN) tablet Take 1 tablet by mouth daily.      . mupirocin ointment (BACTROBAN) 2 %     . niacin 500 MG CR capsule Take 500 mg by mouth at bedtime.      [name] POWD Take by mouth daily.     Marland Kitchen terazosin (HYTRIN) 10 MG capsule TAKE 1 CAPSULE BY MOUTH EVERY NIGHT AT BEDTIME 90 capsule 2  . triamcinolone ointment (KENALOG) 0.1 % APPLY EXTERNALLY TO THE AFFECTED AREA THREE TIMES DAILY 80 g 3  . vitamin C (ASCORBIC ACID) 500 MG tablet Take 500 mg by mouth daily.       Facility-Administered Medications Prior to Visit  Medication Dose Route Frequency Provider Last Rate Last Admin  . methylPREDNISolone acetate (DEPO-MEDROL) 20 MG/ML injection 20 mg  20 mg Intra-Lesional Once Mithran Strike V, MD      . methylPREDNISolone acetate (DEPO-MEDROL) injection 40 mg  40 mg Intra-articular Once Keondria Siever V, MD      . methylPREDNISolone acetate (DEPO-MEDROL) injection 40 mg  40 mg Intra-articular Once Caterin Tabares, [name], MD        ROS: Review of Systems  Constitutional: Negative for appetite change, fatigue and unexpected weight change.  HENT: Negative for congestion, nosebleeds, sneezing, sore throat and trouble swallowing.   Eyes: Negative  for itching and visual disturbance.  Respiratory: Negative for cough.   Cardiovascular: Negative for chest pain, palpitations and leg swelling.  Gastrointestinal: Negative for abdominal distention, blood in stool, diarrhea and nausea.  Genitourinary: Negative for frequency and hematuria.  Musculoskeletal: Positive for arthralgias. Negative for back pain, gait problem, joint swelling and neck pain.  Skin: Positive for rash.  Neurological: Negative for dizziness, tremors, speech difficulty and weakness.  Psychiatric/Behavioral: Negative for agitation, dysphoric mood and sleep disturbance. The patient is not nervous/anxious.     Objective:  BP (!) 164/78 (BP Location: Left Arm, Patient Position: Sitting, Cuff Size: Large)   Pulse 71   Temp 98 F (36.7 C) (Oral)   Ht 5\' 10"  (1.778 m)   Wt 201 lb (91.2 kg)   SpO2 96%   BMI 28.84 kg/m   BP Readings from Last 3 Encounters:  02/06/20 (!) 164/78  08/08/19 (!) 164/96  02/02/19 (!) 142/76    Wt Readings from Last 3 Encounters:  02/06/20 201 lb (91.2 kg)  08/08/19 202 lb (91.6 kg)  02/02/19 195 lb (88.5 kg)    Physical Exam Constitutional:      General: He is not in acute distress.    Appearance: He is well-developed.     Comments: NAD  Eyes:     Conjunctiva/sclera: Conjunctivae normal.  Pupils: Pupils are equal, round, and reactive to light.  Neck:     Thyroid: No thyromegaly.     Vascular: No JVD.  Cardiovascular:     Rate and Rhythm: Normal rate and regular rhythm.     Heart sounds: Normal heart sounds. No murmur heard.  No friction rub. No gallop.   Pulmonary:     Effort: Pulmonary effort is normal. No respiratory distress.     Breath sounds: Normal breath sounds. No wheezing or rales.  Chest:     Chest wall: No tenderness.  Abdominal:     General: Bowel sounds are normal. There is no distension.     Palpations: Abdomen is soft. There is no mass.     Tenderness: There is no abdominal tenderness. There is no  guarding or rebound.  Musculoskeletal:        General: No tenderness. Normal range of motion.     Cervical back: Normal range of motion.  Lymphadenopathy:     Cervical: No cervical adenopathy.  Skin:    General: Skin is warm and dry.     Findings: No rash.  Neurological:     Mental Status: He is alert and oriented to person, place, and time.     Cranial Nerves: No cranial nerve deficit.     Motor: No abnormal muscle tone.     Coordination: Coordination normal.     Gait: Gait normal.     Deep Tendon Reflexes: Reflexes are normal and symmetric.  Psychiatric:        Behavior: Behavior normal.        Thought Content: Thought content normal.        Judgment: Judgment normal.   rash on buttocks w/two erosions  Lab Results  Component Value Date   WBC 5.3 02/02/2019   HGB 14.4 02/02/2019   HCT 42.6 02/02/2019   PLT 177.0 02/02/2019   GLUCOSE 102 (H) 02/02/2019   CHOL 191 01/27/2018   TRIG 144.0 01/27/2018   HDL 36.20 (L) 01/27/2018   LDLDIRECT 101.0 01/10/2015   LDLCALC 126 (H) 01/27/2018   ALT 17 01/27/2018   AST 18 01/27/2018   NA 141 02/02/2019   K 3.7 02/02/2019   CL 105 02/02/2019   CREATININE 1.20 02/02/2019   BUN 15 02/02/2019   CO2 29 02/02/2019   TSH 0.55 07/28/2018   PSA 3.55 07/28/2018   HGBA1C 5.8 02/02/2019    VAS US CAROTID  Result Date: 08/15/2019 Carotid Arterial Duplex Study Indications:       Bilateral bruits and patient has a history of chronic                    headaches due to an old injury from an accident resulting in                    issues with the cervical spine. He c/o intermittent dizziness                    when coming up too quickly from a bended position. He denies                    any other cerebrovascular symptoms. Risk Factors:      Hypertension, hyperlipidemia, past history of smoking. Comparison Study:  NA Performing Technologist: Tyna Jaksch RVT  Examination Guidelines: A complete evaluation includes B-mode imaging, spectral  Doppler, color Doppler, and power Doppler as needed of all accessible portions of each  vessel. Bilateral testing is considered an integral part of a complete examination. Limited examinations for reoccurring indications may be performed as noted.  Right Carotid Findings: +----------+--------+--------+--------+------------------+--------+           PSV cm/sEDV cm/sStenosisPlaque DescriptionComments +----------+--------+--------+--------+------------------+--------+ CCA Prox  116     7                                          +----------+--------+--------+--------+------------------+--------+ CCA Distal70      9                                          +----------+--------+--------+--------+------------------+--------+ ICA Prox  63      7                                          +----------+--------+--------+--------+------------------+--------+ ICA Mid   73      17                                         +----------+--------+--------+--------+------------------+--------+ ICA Distal60      11                                         +----------+--------+--------+--------+------------------+--------+ ECA       81      8               heterogenous               +----------+--------+--------+--------+------------------+--------+ +----------+--------+-------+----------------+-------------------+           PSV cm/sEDV cmsDescribe        Arm Pressure (mmHG) +----------+--------+-------+----------------+-------------------+ [id number]            Multiphasic, [id number]                 +----------+--------+-------+----------------+-------------------+ +---------+--------+--+--------+-+---------+ VertebralPSV cm/s49EDV cm/s7Antegrade +---------+--------+--+--------+-+---------+  Left Carotid Findings: +----------+--------+--------+--------+------------------+--------+           PSV cm/sEDV cm/sStenosisPlaque DescriptionComments  +----------+--------+--------+--------+------------------+--------+ CCA Prox  102     16                                         +----------+--------+--------+--------+------------------+--------+ CCA Distal64      9                                          +----------+--------+--------+--------+------------------+--------+ ICA Prox  71      9                                          +----------+--------+--------+--------+------------------+--------+ ICA Mid   70      16                                         +----------+--------+--------+--------+------------------+--------+  ICA Distal78      18                                         +----------+--------+--------+--------+------------------+--------+ ECA       91      10                                         +----------+--------+--------+--------+------------------+--------+ +----------+--------+--------+----------------+-------------------+           PSV cm/sEDV cm/sDescribe        Arm Pressure (mmHG) +----------+--------+--------+----------------+-------------------+ [id number]             Multiphasic, [id number]                 +----------+--------+--------+----------------+-------------------+ +---------+--------+--+--------+-+---------+ VertebralPSV cm/s52EDV cm/s9Antegrade +---------+--------+--+--------+-+---------+  Summary: Right Carotid: There was no evidence of thrombus, dissection, atherosclerotic                plaque or stenosis in the cervical carotid system. Left Carotid: There was no evidence of thrombus, dissection, atherosclerotic               plaque or stenosis in the cervical carotid system. Vertebrals:  Bilateral vertebral arteries demonstrate antegrade flow. Subclavians: Normal flow hemodynamics were seen in bilateral subclavian              arteries. *See table(s) above for measurements and observations.  Electronically signed by Charlton Haws MD on 08/15/2019 at 3:24:05 PM.     Final     Assessment & Plan:   There are no diagnoses linked to this encounter.   No orders of the defined types were placed in this encounter.    Follow-up: No follow-ups on file.  Sonda Primes, MD

## 2020-02-07 ENCOUNTER — Telehealth: Payer: Self-pay | Admitting: Internal Medicine

## 2020-02-07 NOTE — Telephone Encounter (Signed)
New message:   Pt is calling and states he has some questions in regards to prednisone. Please advise.

## 2020-02-08 NOTE — Telephone Encounter (Signed)
Pt states that he was prescribed Prednisone for a rash during his visit with Dr. Alain Marion on 02/06/2020. He states that he had another office visit right after somewhere else and had to get a steroid shot in his neck. He has not taken the Prednisone because he does not know if the steroid shot will affect the prednisone. Please advise.

## 2020-02-09 ENCOUNTER — Ambulatory Visit: Payer: Medicare Other

## 2020-02-09 NOTE — Telephone Encounter (Signed)
Would recommend to hold off on prednisone course for now and given steroid shot 3-4 days to kick in and help with rash. Can use cortisone ointment otc for rash in meantime.

## 2020-02-09 NOTE — Telephone Encounter (Signed)
Called pt and informed him of below.

## 2020-03-07 DIAGNOSIS — M791 Myalgia, unspecified site: Secondary | ICD-10-CM | POA: Diagnosis not present

## 2020-03-07 DIAGNOSIS — M533 Sacrococcygeal disorders, not elsewhere classified: Secondary | ICD-10-CM | POA: Diagnosis not present

## 2020-03-07 DIAGNOSIS — M545 Low back pain: Secondary | ICD-10-CM | POA: Diagnosis not present

## 2020-03-10 IMAGING — DX DG CHEST 2V
2 series · 2 of 2 positions shown · non-contrast
Comparison: 07/15/2017.  CT 07/15/2015

CLINICAL DATA: Follow-up abnormal chest x-ray, chest CT

EXAM:
CHEST - 2 VIEW

[chest pa]
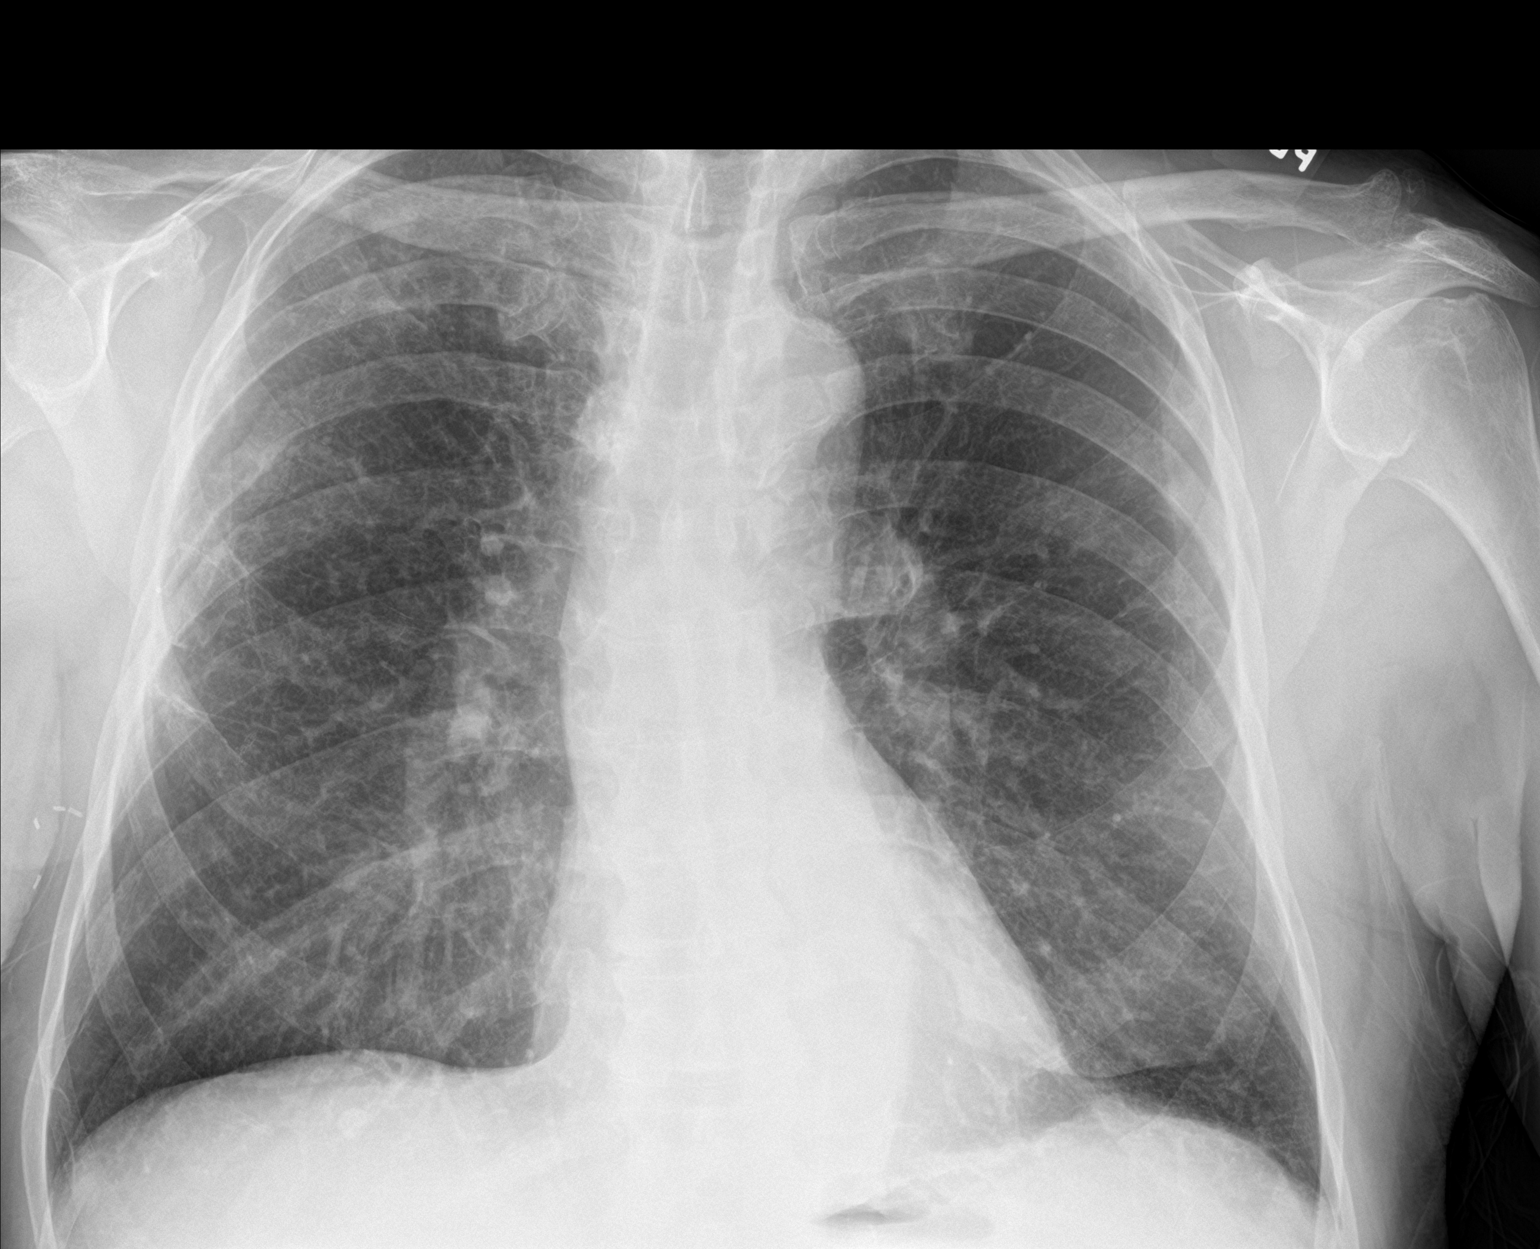

[chest lat]
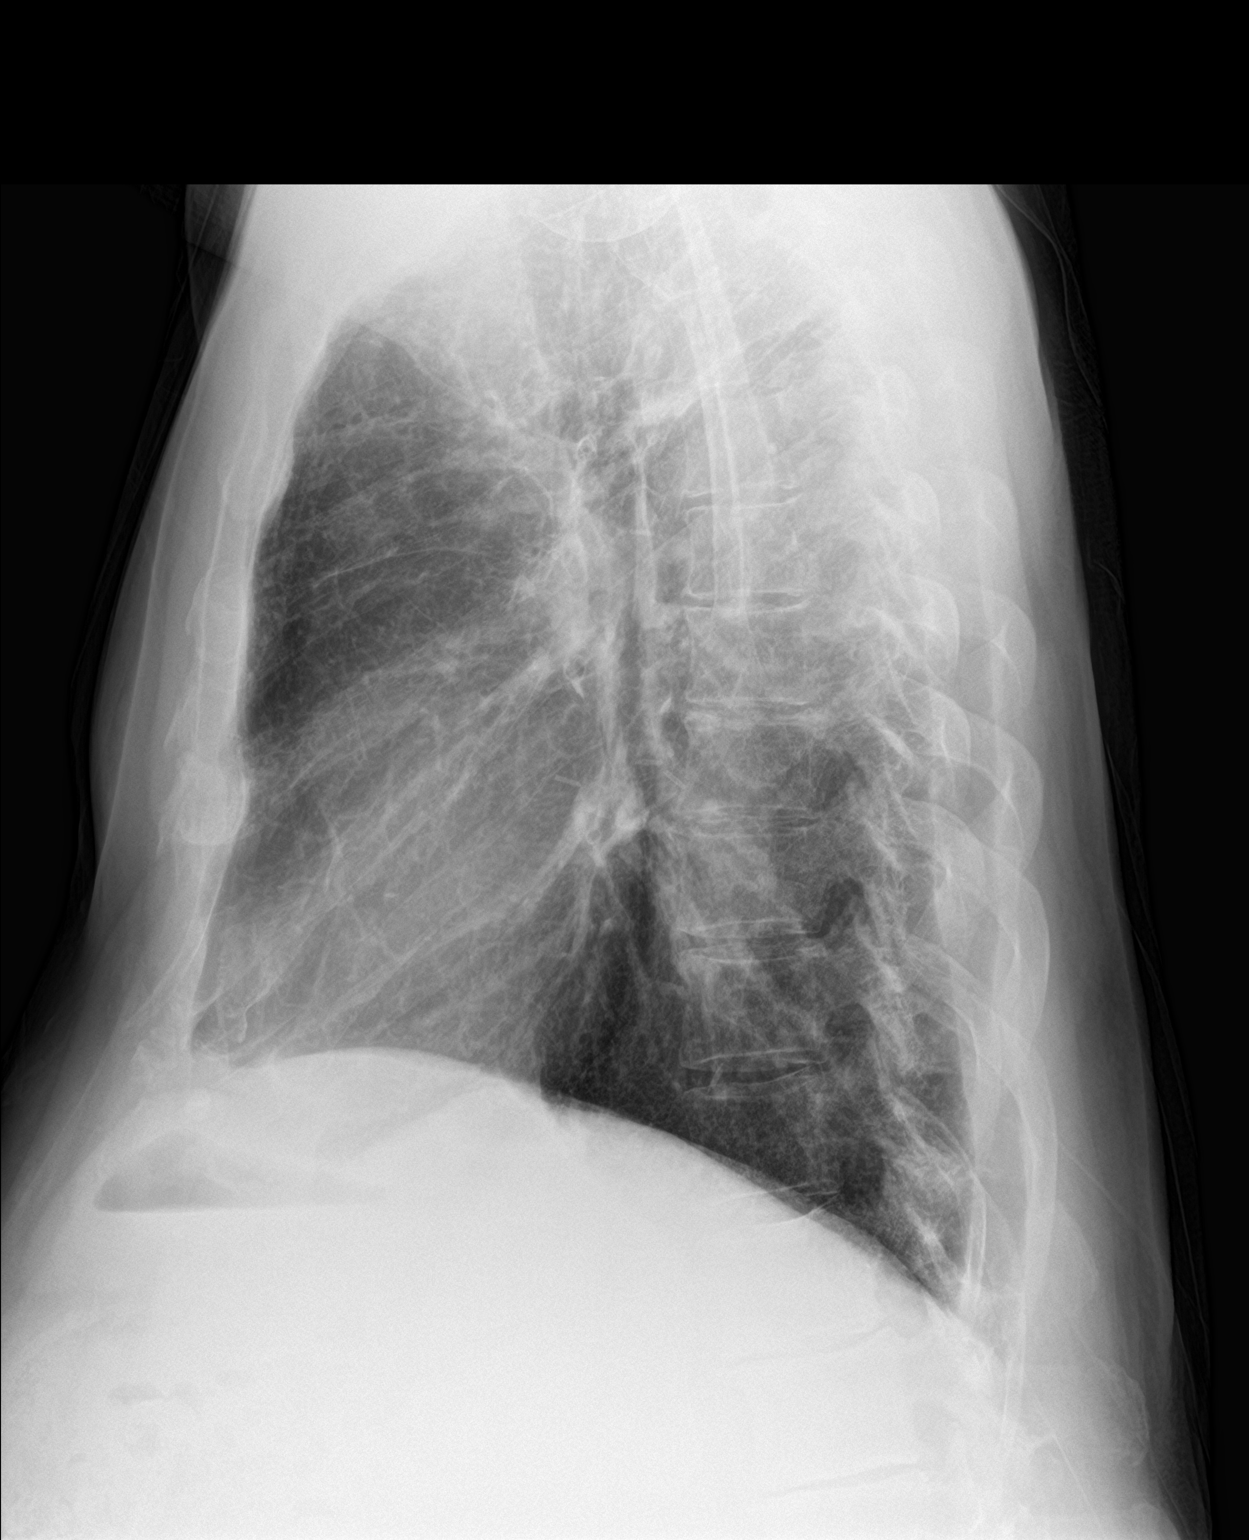

[2 of 2 positions shown; findings below may reference images not displayed]

FINDINGS: Mild hyperinflation. Heart is normal size. Areas of scarring noted
in the upper lobes bilaterally, right greater than left. No acute
confluent opacities or effusions. No acute bony abnormality.
IMPRESSION: Mild hyperinflation suggesting COPD. Areas of scarring in the upper
lobes bilaterally. No active disease.

## 2020-03-12 DIAGNOSIS — L821 Other seborrheic keratosis: Secondary | ICD-10-CM | POA: Diagnosis not present

## 2020-03-12 DIAGNOSIS — Z85820 Personal history of malignant melanoma of skin: Secondary | ICD-10-CM | POA: Diagnosis not present

## 2020-03-12 DIAGNOSIS — L812 Freckles: Secondary | ICD-10-CM | POA: Diagnosis not present

## 2020-03-12 DIAGNOSIS — Z85828 Personal history of other malignant neoplasm of skin: Secondary | ICD-10-CM | POA: Diagnosis not present

## 2020-03-12 DIAGNOSIS — D1801 Hemangioma of skin and subcutaneous tissue: Secondary | ICD-10-CM | POA: Diagnosis not present

## 2020-03-12 DIAGNOSIS — D225 Melanocytic nevi of trunk: Secondary | ICD-10-CM | POA: Diagnosis not present

## 2020-03-12 DIAGNOSIS — D485 Neoplasm of uncertain behavior of skin: Secondary | ICD-10-CM | POA: Diagnosis not present

## 2020-04-25 DIAGNOSIS — H524 Presbyopia: Secondary | ICD-10-CM | POA: Diagnosis not present

## 2020-04-25 DIAGNOSIS — H52203 Unspecified astigmatism, bilateral: Secondary | ICD-10-CM | POA: Diagnosis not present

## 2020-04-25 DIAGNOSIS — Z961 Presence of intraocular lens: Secondary | ICD-10-CM | POA: Diagnosis not present

## 2020-05-13 ENCOUNTER — Ambulatory Visit: Payer: Medicare Other | Admitting: Internal Medicine

## 2020-05-16 DIAGNOSIS — D485 Neoplasm of uncertain behavior of skin: Secondary | ICD-10-CM | POA: Diagnosis not present

## 2020-05-16 DIAGNOSIS — L988 Other specified disorders of the skin and subcutaneous tissue: Secondary | ICD-10-CM | POA: Diagnosis not present

## 2020-05-23 ENCOUNTER — Other Ambulatory Visit: Payer: Self-pay

## 2020-05-23 ENCOUNTER — Encounter: Payer: Self-pay | Admitting: Internal Medicine

## 2020-05-23 ENCOUNTER — Ambulatory Visit (INDEPENDENT_AMBULATORY_CARE_PROVIDER_SITE_OTHER): Payer: Medicare Other | Admitting: Internal Medicine

## 2020-05-23 DIAGNOSIS — G8929 Other chronic pain: Secondary | ICD-10-CM

## 2020-05-23 DIAGNOSIS — B372 Candidiasis of skin and nail: Secondary | ICD-10-CM | POA: Diagnosis not present

## 2020-05-23 DIAGNOSIS — L989 Disorder of the skin and subcutaneous tissue, unspecified: Secondary | ICD-10-CM | POA: Diagnosis not present

## 2020-05-23 DIAGNOSIS — M542 Cervicalgia: Secondary | ICD-10-CM

## 2020-05-23 LAB — COMPREHENSIVE METABOLIC PANEL
ALT: 17 U/L (ref 0–53)
AST: 17 U/L (ref 0–37)
Albumin: 4 g/dL (ref 3.5–5.2)
Alkaline Phosphatase: 101 U/L (ref 39–117)
BUN: 20 mg/dL (ref 6–23)
CO2: 30 mEq/L (ref 19–32)
Calcium: 9.7 mg/dL (ref 8.4–10.5)
Chloride: 107 mEq/L (ref 96–112)
Creatinine, Ser: 1.24 mg/dL (ref 0.40–1.50)
GFR: 52.71 mL/min — ABNORMAL LOW (ref >60.00)
Glucose, Bld: 96 mg/dL (ref 70–99)
Potassium: 4.1 mEq/L (ref 3.5–5.1)
Sodium: 140 mEq/L (ref 135–145)
Total Bilirubin: 0.5 mg/dL (ref 0.2–1.2)
Total Protein: 6.8 g/dL (ref 6.0–8.3)

## 2020-05-23 MED ORDER — CLOTRIMAZOLE-BETAMETHASONE 1-0.05 % EX CREA: 1.0000 "application " | TOPICAL_CREAM | Freq: Two times a day (BID) | CUTANEOUS | 3 refills | Status: AC

## 2020-05-23 NOTE — Assessment & Plan Note (Signed)
ROM exercises 

## 2020-05-23 NOTE — Addendum Note (Signed)
Addended by: Jacob Moores on: 05/23/2020 02:15 PM   Modules accepted: Orders

## 2020-05-23 NOTE — Patient Instructions (Addendum)
  Nylon athletic underwear, pants  Try low nickel diet

## 2020-05-23 NOTE — Assessment & Plan Note (Signed)
10/21 groin Lotrisone cream Nylon athletic underwear, pants

## 2020-05-23 NOTE — Progress Notes (Signed)
Subjective:  Patient ID: Curtis Jacobs, male    DOB: 14-Jul-1933  Age: 86 y.o. MRN: [id number]  CC: Follow-up   HPI DOC TEA presents for neck pain, BPH, allergies, rash - worse   Outpatient Medications Prior to Visit  Medication Sig Dispense Refill   Cholecalciferol 1000 UNITS tablet Take 2,000 Units by mouth daily.      fluocinonide cream (LIDEX) 0.05 % Apply 1 application topically daily.     Flurandrenolide 4 MCG/SQCM TAPE Apply 1 each topically in the morning and at bedtime. Apply to lesions 1 each 3   Glucosamine-Chondroit-Vit C-Mn (GLUCOSAMINE CHONDROITIN COMPLX) CAPS Take 1 capsule by mouth daily.       guaiFENesin (MUCINEX) 600 MG 12 hr tablet Take 2 tablets (1,200 mg total) by mouth 2 (two) times daily.     loratadine (CLARITIN) 10 MG tablet Take 10 mg by mouth daily.     methylPREDNISolone (MEDROL DOSEPAK) 4 MG TBPK tablet As directed 21 tablet 0   Multiple Vitamin (MULTIVITAMIN) tablet Take 1 tablet by mouth daily.       mupirocin ointment (BACTROBAN) 2 %      niacin 500 MG CR capsule Take 500 mg by mouth at bedtime.       Saw Palmetto POWD Take by mouth daily.      terazosin (HYTRIN) 10 MG capsule TAKE 1 CAPSULE BY MOUTH EVERY NIGHT AT BEDTIME 90 capsule 2   triamcinolone ointment (KENALOG) 0.1 % APPLY EXTERNALLY TO THE AFFECTED AREA THREE TIMES DAILY 80 g 3   vitamin C (ASCORBIC ACID) 500 MG tablet Take 500 mg by mouth daily.       Facility-Administered Medications Prior to Visit  Medication Dose Route Frequency Provider Last Rate Last Admin   methylPREDNISolone acetate (DEPO-MEDROL) 20 MG/ML injection 20 mg  20 mg Intra-Lesional Once Zyrus Hetland, [name], MD       methylPREDNISolone acetate (DEPO-MEDROL) injection 40 mg  40 mg Intra-articular Once Olive Motyka, [name], MD       methylPREDNISolone acetate (DEPO-MEDROL) injection 40 mg  40 mg Intra-articular Once Timira Bieda, [name], MD        ROS: Review of Systems  Constitutional: Negative for  appetite change, fatigue and unexpected weight change.  HENT: Negative for congestion, nosebleeds, sneezing, sore throat and trouble swallowing.   Eyes: Negative for itching and visual disturbance.  Respiratory: Negative for cough.   Cardiovascular: Negative for chest pain, palpitations and leg swelling.  Gastrointestinal: Negative for abdominal distention, blood in stool, diarrhea and nausea.  Genitourinary: Negative for frequency and hematuria.  Musculoskeletal: Positive for neck pain and neck stiffness. Negative for back pain, gait problem and joint swelling.  Skin: Positive for rash.  Neurological: Negative for dizziness, tremors, speech difficulty and weakness.  Psychiatric/Behavioral: Negative for agitation, dysphoric mood, sleep disturbance and suicidal ideas. The patient is not nervous/anxious.     Objective:  BP 124/70 (BP Location: Left Arm, Patient Position: Sitting, Cuff Size: Normal)    Pulse 70    Temp 98.3 F (36.8 C) (Oral)    Ht 5\' 10"  (1.778 m)    Wt 194 lb (88 kg)    SpO2 95%    BMI 27.84 kg/m   BP Readings from Last 3 Encounters:  05/23/20 124/70  02/06/20 (!) 164/78  08/08/19 (!) 164/96    Wt Readings from Last 3 Encounters:  05/23/20 194 lb (88 kg)  02/06/20 201 lb (91.2 kg)  08/08/19 202 lb (91.6 kg)    Physical  Exam Constitutional:      General: He is not in acute distress.    Appearance: He is well-developed.     Comments: NAD  Eyes:     Conjunctiva/sclera: Conjunctivae normal.     Pupils: Pupils are equal, round, and reactive to light.  Neck:     Thyroid: No thyromegaly.     Vascular: No JVD.  Cardiovascular:     Rate and Rhythm: Normal rate and regular rhythm.     Heart sounds: Normal heart sounds. No murmur heard.  No friction rub. No gallop.   Pulmonary:     Effort: Pulmonary effort is normal. No respiratory distress.     Breath sounds: Normal breath sounds. No wheezing or rales.  Chest:     Chest wall: No tenderness.  Abdominal:      General: Bowel sounds are normal. There is no distension.     Palpations: Abdomen is soft. There is no mass.     Tenderness: There is no abdominal tenderness. There is no guarding or rebound.  Musculoskeletal:        General: No tenderness. Normal range of motion.     Cervical back: Normal range of motion.  Lymphadenopathy:     Cervical: No cervical adenopathy.  Skin:    General: Skin is warm and dry.     Findings: Erythema and rash present.  Neurological:     Mental Status: He is alert and oriented to person, place, and time.     Cranial Nerves: No cranial nerve deficit.     Motor: No abnormal muscle tone.     Coordination: Coordination normal.     Gait: Gait normal.     Deep Tendon Reflexes: Reflexes are normal and symmetric.  Psychiatric:        Behavior: Behavior normal.        Thought Content: Thought content normal.        Judgment: Judgment normal.   rash on buttocks, in the groin/leg folds    A total time of >25 minutes was spent preparing to see the patient, reviewing tests, x-rays, operative reports and outside records.  Also, obtaining history and performing comprehensive physical exam.  Additionally, counseling the patient regarding the rash  Lab Results  Component Value Date   WBC 5.3 02/02/2019   HGB 14.4 02/02/2019   HCT 42.6 02/02/2019   PLT 177.0 02/02/2019   GLUCOSE 102 (H) 02/02/2019   CHOL 191 01/27/2018   TRIG 144.0 01/27/2018   HDL 36.20 (L) 01/27/2018   LDLDIRECT 101.0 01/10/2015   LDLCALC 126 (H) 01/27/2018   ALT 17 01/27/2018   AST 18 01/27/2018   NA 141 02/02/2019   K 3.7 02/02/2019   CL 105 02/02/2019   CREATININE 1.20 02/02/2019   BUN 15 02/02/2019   CO2 29 02/02/2019   TSH 0.55 07/28/2018   PSA 3.55 07/28/2018   HGBA1C 5.8 02/02/2019    VAS US CAROTID  Result Date: 08/15/2019 Carotid Arterial Duplex Study Indications:       Bilateral bruits and patient has a history of chronic                    headaches due to an old injury from  an accident resulting in                    issues with the cervical spine. He c/o intermittent dizziness  when coming up too quickly from a bended position. He denies                    any other cerebrovascular symptoms. Risk Factors:      Hypertension, hyperlipidemia, past history of smoking. Comparison Study:  NA Performing Technologist: Tyna Jaksch RVT  Examination Guidelines: A complete evaluation includes B-mode imaging, spectral Doppler, color Doppler, and power Doppler as needed of all accessible portions of each vessel. Bilateral testing is considered an integral part of a complete examination. Limited examinations for reoccurring indications may be performed as noted.  Right Carotid Findings: +----------+--------+--------+--------+------------------+--------+             PSV cm/s EDV cm/s Stenosis Plaque Description Comments  +----------+--------+--------+--------+------------------+--------+  CCA Prox   116      7                                              +----------+--------+--------+--------+------------------+--------+  CCA Distal 70       9                                              +----------+--------+--------+--------+------------------+--------+  ICA Prox   63       7                                              +----------+--------+--------+--------+------------------+--------+  ICA Mid    73       17                                             +----------+--------+--------+--------+------------------+--------+  ICA Distal 60       11                                             +----------+--------+--------+--------+------------------+--------+  ECA        81       8                 heterogenous                 +----------+--------+--------+--------+------------------+--------+ +----------+--------+-------+----------------+-------------------+             PSV cm/s EDV cms Describe         Arm Pressure (mmHG)   +----------+--------+-------+----------------+-------------------+  Subclavian 149              Multiphasic, WNL 169                  +----------+--------+-------+----------------+-------------------+ +---------+--------+--+--------+-+---------+  Vertebral PSV cm/s 49 EDV cm/s 7 Antegrade  +---------+--------+--+--------+-+---------+  Left Carotid Findings: +----------+--------+--------+--------+------------------+--------+             PSV cm/s EDV cm/s Stenosis Plaque Description Comments  +----------+--------+--------+--------+------------------+--------+  CCA Prox   102      16                                             +----------+--------+--------+--------+------------------+--------+  CCA Distal 64       9                                              +----------+--------+--------+--------+------------------+--------+  ICA Prox   71       9                                              +----------+--------+--------+--------+------------------+--------+  ICA Mid    70       16                                             +----------+--------+--------+--------+------------------+--------+  ICA Distal 78       18                                             +----------+--------+--------+--------+------------------+--------+  ECA        91       10                                             +----------+--------+--------+--------+------------------+--------+ +----------+--------+--------+----------------+-------------------+             PSV cm/s EDV cm/s Describe         Arm Pressure (mmHG)  +----------+--------+--------+----------------+-------------------+  Subclavian 103               Multiphasic, WNL 170                  +----------+--------+--------+----------------+-------------------+ +---------+--------+--+--------+-+---------+  Vertebral PSV cm/s 52 EDV cm/s 9 Antegrade  +---------+--------+--+--------+-+---------+  Summary: Right Carotid: There was no evidence of thrombus, dissection, atherosclerotic                 plaque or stenosis in the cervical carotid system. Left Carotid: There was no evidence of thrombus, dissection, atherosclerotic               plaque or stenosis in the cervical carotid system. Vertebrals:  Bilateral vertebral arteries demonstrate antegrade flow. Subclavians: Normal flow hemodynamics were seen in bilateral subclavian              arteries. *See table(s) above for measurements and observations.  Electronically signed by Charlton Haws MD on 08/15/2019 at 3:24:05 PM.    Final     Assessment & Plan:   There are no diagnoses linked to this encounter.   No orders of the defined types were placed in this encounter.    Follow-up: No follow-ups on file.  Sonda Primes, MD

## 2020-05-23 NOTE — Assessment & Plan Note (Addendum)
Celiac panel Gluten free diet trial - did not help Medrol pack Try low nickel diet

## 2020-05-26 LAB — ALLERGEN FOOD PROFILE SPECIFIC IGE
Allergen Apple, IgE: 0.11 kU/L — AB
Allergen Corn, IgE: <0.1 kU/L
Allergen Tomato, IgE: 0.36 kU/L — AB
Chicken IgE: <0.1 kU/L
Codfish IgE: <0.1 kU/L
Egg White IgE: 0.51 kU/L — AB
IgE (Immunoglobulin E), Serum: 35 IU/mL (ref 6–495)
Milk IgE: 0.18 kU/L — AB
Orange: <0.1 kU/L
Peanut IgE: 0.12 kU/L — AB
Shrimp IgE: <0.1 kU/L
Soybean IgE: <0.1 kU/L
Tuna: <0.1 kU/L
Wheat IgE: <0.1 kU/L

## 2020-05-26 LAB — RETICULIN ANTIBODIES, IGA W TITER: Reticulin IgA Screen: NEGATIVE

## 2020-05-26 LAB — TISSUE TRANSGLUTAMINASE, IGA: (tTG) Ab, IgA: <1 U/mL

## 2020-05-26 LAB — GLIADIN ANTIBODIES, SERUM
Gliadin IgA: <1 U/mL
Gliadin IgG: <1 U/mL

## 2020-05-27 ENCOUNTER — Telehealth: Payer: Self-pay

## 2020-05-27 ENCOUNTER — Ambulatory Visit (INDEPENDENT_AMBULATORY_CARE_PROVIDER_SITE_OTHER): Payer: Medicare Other

## 2020-05-27 DIAGNOSIS — Z0000 Encounter for general adult medical examination without abnormal findings: Secondary | ICD-10-CM | POA: Diagnosis not present

## 2020-05-27 NOTE — Progress Notes (Signed)
I connected with Curtis Jacobs today by telephone and verified that I am speaking with the correct person using two identifiers. Location patient: home Location provider: work Persons participating in the virtual visit: Jazz Rogala and Lisette Abu, LPN.   I discussed the limitations, risks, security and privacy concerns of performing an evaluation and management service by telephone and the availability of in person appointments. I also discussed with the patient that there may be a patient responsible charge related to this service. The patient expressed understanding and verbally consented to this telephonic visit.    Interactive audio and video telecommunications were attempted between this provider and patient, however failed, due to patient having technical difficulties OR patient did not have access to video capability.  We continued and completed visit with audio only.  Some vital signs may be absent or patient reported.   Time Spent with patient on telephone encounter: 25 minutes  Subjective:   Curtis Jacobs is a 86 y.o. male who presents for Medicare Annual/Subsequent preventive examination.  Review of Systems    No ROS. Medicare Wellness Visit. Cardiac Risk Factors include: advanced age (>46men, >79 women);dyslipidemia;family history of premature cardiovascular disease;hypertension;male gender     Objective:    There were no vitals filed for this visit. There is no height or weight on file to calculate BMI.  Advanced Directives 05/27/2020  Does Patient Have a Medical Advance Directive? Yes  Type of Advance Directive Living will;Healthcare Power of [place] in Chart? No - copy requested    Current Medications (verified) Outpatient Encounter Medications as of 05/27/2020  Medication Sig  . Cholecalciferol 1000 UNITS tablet Take 2,000 Units by mouth daily.   . clotrimazole-betamethasone (LOTRISONE) cream Apply 1 application topically  2 (two) times daily.  . fluocinonide cream (LIDEX) 1.60 % Apply 1 application topically daily.  Marland Kitchen Flurandrenolide 4 MCG/SQCM TAPE Apply 1 each topically in the morning and at bedtime. Apply to lesions  . Glucosamine-Chondroit-Vit C-Mn (GLUCOSAMINE CHONDROITIN COMPLX) CAPS Take 1 capsule by mouth daily.    Marland Kitchen guaiFENesin (MUCINEX) 600 MG 12 hr tablet Take 2 tablets (1,200 mg total) by mouth 2 (two) times daily.  Marland Kitchen loratadine (CLARITIN) 10 MG tablet Take 10 mg by mouth daily.  . methylPREDNISolone (MEDROL DOSEPAK) 4 MG TBPK tablet As directed  . Multiple Vitamin (MULTIVITAMIN) tablet Take 1 tablet by mouth daily.    . mupirocin ointment (BACTROBAN) 2 %   . niacin 500 MG CR capsule Take 500 mg by mouth at bedtime.    [name] POWD Take by mouth daily.   Marland Kitchen terazosin (HYTRIN) 10 MG capsule TAKE 1 CAPSULE BY MOUTH EVERY NIGHT AT BEDTIME  . triamcinolone ointment (KENALOG) 0.1 % APPLY EXTERNALLY TO THE AFFECTED AREA THREE TIMES DAILY  . vitamin C (ASCORBIC ACID) 500 MG tablet Take 500 mg by mouth daily.     Facility-Administered Encounter Medications as of 05/27/2020  Medication  . methylPREDNISolone acetate (DEPO-MEDROL) 20 MG/ML injection 20 mg  . methylPREDNISolone acetate (DEPO-MEDROL) injection 40 mg  . methylPREDNISolone acetate (DEPO-MEDROL) injection 40 mg    Allergies (verified) Cordran [flurandrenolide] and Lisinopril   History: Past Medical History:  Diagnosis Date  . BPH (benign prostatic hypertrophy)    Dr. Risa Grill  . HA (headache)    Traction- Dr. Jannifer Franklin- had MRI.MRA  . HTN (hypertension)   . Hyperlipidemia    Past Surgical History:  Procedure Laterality Date  . EXCISIONAL HEMORRHOIDECTOMY  Family History  Problem Relation Age of Onset  . Heart disease Father    Social History   Socioeconomic History  . Marital status: Married    Spouse name: Not on file  . Number of children: Not on file  . Years of education: Not on file  . Highest education level:  Not on file  Occupational History  . Not on file  Tobacco Use  . Smoking status: Former Smoker    Packs/day: 0.10    Years: 3.00    Pack years: 0.30    Types: Cigarettes    Quit date: 07/25/1952    Years since quitting: 67.8  . Smokeless tobacco: Never Used  . Tobacco comment: used to "smoke but not inhale"  Substance and Sexual Activity  . Alcohol use: No  . Drug use: No  . Sexual activity: Yes  Other Topics Concern  . Not on file  Social History Narrative  . Not on file   Social Determinants of Health   Financial Resource Strain: Low Risk   . Difficulty of Paying Living Expenses: Not hard at all  Food Insecurity: No Food Insecurity  . Worried About Charity fundraiser in the Last Year: Never true  . Ran Out of Food in the Last Year: Never true  Transportation Needs: No Transportation Needs  . Lack of Transportation (Medical): No  . Lack of Transportation (Non-Medical): No  Physical Activity: Sufficiently Active  . Days of Exercise per Week: 5 days  . Minutes of Exercise per Session: 30 min  Stress: No Stress Concern Present  . Feeling of Stress : Not at all  Social Connections: Socially Integrated  . Frequency of Communication with Friends and Family: More than three times a week  . Frequency of Social Gatherings with Friends and Family: Once a week  . Attends Religious Services: More than 4 times per year  . Active Member of Clubs or Organizations: No  . Attends Archivist Meetings: More than 4 times per year  . Marital Status: Married    Tobacco Counseling Counseling given: Not Answered Comment: used to "smoke but not inhale"   Clinical Intake:  Pre-visit preparation completed: Yes  Pain : No/denies pain     Nutritional Risks: None Diabetes: No  How often do you need to have someone help you when you read instructions, pamphlets, or other written materials from your doctor or pharmacy?: 1 - Never What is the last grade level you completed in  school?: HSG  Diabetic? no  Interpreter Needed?: No  Information entered by :: Brittnae Aschenbrenner N. Dhamar Gregory, LPN   Activities of Daily Living In your present state of health, do you have any difficulty performing the following activities: 05/27/2020 05/23/2020  Hearing? N N  Vision? N N  Difficulty concentrating or making decisions? N N  Walking or climbing stairs? N N  Dressing or bathing? N N  Doing errands, shopping? N N  Preparing Food and eating ? N -  Using the Toilet? N -  In the past six months, have you accidently leaked urine? N -  Do you have problems with loss of bowel control? N -  Managing your Medications? N -  Managing your Finances? N -  Housekeeping or managing your Housekeeping? N -  Some recent data might be hidden    Patient Care Team: Plotnikov, Evie Lacks, MD as PCP - General Juanito Doom, MD as Consulting Physician (Pulmonary Disease)  Indicate any recent Medical Services you  may have received from other than [facility] providers in the past year (date may be approximate).     Assessment:   This is a routine wellness examination for Curtis Jacobs.  Hearing/Vision screen No exam data present  Dietary issues and exercise activities discussed: Current Exercise Habits: Home exercise routine, Type of exercise: walking;Other - see comments (yard work, gardening and working in his shop at home), Time (Minutes): 30, Frequency (Times/Week): 5, Weekly Exercise (Minutes/Week): 150, Intensity: Moderate, Exercise limited by: respiratory conditions(s)  Goals   None    Depression Screen PHQ 2/9 Scores 05/27/2020 02/06/2020 01/27/2018 01/14/2017 01/14/2016 01/10/2015  PHQ - 2 Score 0 0 0 0 0 0    Fall Risk Fall Risk  05/27/2020 02/06/2020 01/27/2018 01/14/2017 01/14/2016  Falls in the past year? 0 0 No No No  Number falls in past yr: 0 0 - - -  Injury with Fall? 0 0 - - -  Risk for fall due to : No Fall Risks - - - -  Follow up Falls evaluation completed - - - -    Any stairs  in or around the home? No  If so, are there any without handrails? No  Home free of loose throw rugs in walkways, pet beds, electrical cords, etc? Yes  Adequate lighting in your home to reduce risk of falls? Yes   ASSISTIVE DEVICES UTILIZED TO PREVENT FALLS:  Life alert? No  Use of a cane, walker or w/c? No  Grab bars in the bathroom? Yes  Shower chair or bench in shower? No  Elevated toilet seat or a handicapped toilet? No   TIMED UP AND GO:  Was the test performed? No .  Length of time to ambulate 10 feet: 0 sec.   Gait steady and fast without use of assistive device  Cognitive Function: not indicated; Patient is cogitatively intact.        Immunizations Immunization History  Administered Date(s) Administered  . Influenza Split 05/29/2011, 05/17/2012  . Influenza Whole 07/07/2004, 05/24/2010  . Influenza, High Dose Seasonal PF 06/03/2015, 05/19/2016, 05/19/2017, 05/19/2018, 05/10/2019  . Influenza-Unspecified 06/16/2013, 04/17/2014  . Moderna SARS-COVID-2 Vaccination 09/29/2019, 10/27/2019  . Pneumococcal Conjugate-13 07/07/2013  . Pneumococcal Polysaccharide-23 06/07/2009, 01/14/2016, 11/30/2016  . Td 06/17/2012  . Tdap 11/04/2015  . Zoster 08/19/2010  . Zoster Recombinat (Shingrix) 02/08/2018, 05/19/2018    TDAP status: Up to date Flu Vaccine status: Up to date Pneumococcal vaccine status: Up to date Covid-19 vaccine status: Completed vaccines  Qualifies for Shingles Vaccine? Yes   Zostavax completed Yes   Shingrix Completed?: Yes  Screening Tests Health Maintenance  Topic Date Due  . INFLUENZA VACCINE  03/17/2020  . TETANUS/TDAP  11/03/2025  . COVID-19 Vaccine  Completed  . PNA vac Low Risk Adult  Completed    Health Maintenance  Health Maintenance Due  Topic Date Due  . INFLUENZA VACCINE  03/17/2020    Colorectal cancer screening: No longer required.   Lung Cancer Screening: (Low Dose CT Chest recommended if Age 13-80 years, 30 pack-year  currently smoking OR have quit w/in 15years.) does not qualify.   Lung Cancer Screening Referral: no  Additional Screening:  Hepatitis C Screening: does not qualify; Completed no  Vision Screening: Recommended annual ophthalmology exams for early detection of glaucoma and other disorders of the eye. Is the patient up to date with their annual eye exam?  Yes  Who is the provider or what is the name of the office in which the patient attends annual  eye exams? Katy Apo, MD If pt is not established with a provider, would they like to be referred to a provider to establish care? No .   Dental Screening: Recommended annual dental exams for proper oral hygiene  Community Resource Referral / Chronic Care Management: CRR required this visit?  No   CCM required this visit?  No      Plan:     I have personally reviewed and noted the following in the patient's chart:   . Medical and social history . Use of alcohol, tobacco or illicit drugs  . Current medications and supplements . Functional ability and status . Nutritional status . Physical activity . Advanced directives . List of other physicians . Hospitalizations, surgeries, and ER visits in previous 12 months . Vitals . Screenings to include cognitive, depression, and falls . Referrals and appointments  In addition, I have reviewed and discussed with patient certain preventive protocols, quality metrics, and best practice recommendations. A written personalized care plan for preventive services as well as general preventive health recommendations were provided to patient.     Sheral Flow, LPN   [id number]   Nurse Notes:  Patient is cogitatively intact. There were no vitals filed for this visit. There is no height or weight on file to calculate BMI. Patient stated that he has no issues with gait or balance; does not use any assistive devices.

## 2020-05-27 NOTE — Patient Instructions (Addendum)
Curtis Jacobs , Thank you for taking time to come for your Medicare Wellness Visit. I appreciate your ongoing commitment to your health goals. Please review the following plan we discussed and let me know if I can assist you in the future.   Screening recommendations/referrals: Colonoscopy: not a candidate for colon cancer screening due to age. Recommended yearly ophthalmology/optometry visit for glaucoma screening and checkup Recommended yearly dental visit for hygiene and checkup  Vaccinations: Influenza vaccine: 05/10/2019 Pneumococcal vaccine: completed Tdap vaccine: 11/04/2015; due every 10 years (2027) Shingles vaccine: completed   Covid-19: completed  Advanced directives: Please bring a copy of your health care power of attorney and living will to the office at your convenience.  Conditions/risks identified: Yes; Reviewed health maintenance screenings with patient today and relevant education, vaccines, and/or referrals were provided. Please continue to do your personal lifestyle choices by: daily care of teeth and gums, regular physical activity (goal should be 5 days a week for 30 minutes), eat a healthy diet, avoid tobacco and drug use, limiting any alcohol intake, taking a low-dose aspirin (if not allergic or have been advised by your provider otherwise) and taking vitamins and minerals as recommended by your provider. Continue doing brain stimulating activities (puzzles, reading, adult coloring books, staying active) to keep memory sharp. Continue to eat heart healthy diet (full of fruits, vegetables, whole grains, lean protein, water--limit salt, fat, and sugar intake) and increase physical activity as tolerated.  Next appointment: Please schedule your next Medicare Wellness Visit with your Nurse Health Advisor in 1 year by calling [phone].  Preventive Care 2 Years and Older, Male Preventive care refers to lifestyle choices and visits with your health care provider that can promote  health and wellness. What does preventive care include?  A yearly physical exam. This is also called an annual well check.  Dental exams once or twice a year.  Routine eye exams. Ask your health care provider how often you should have your eyes checked.  Personal lifestyle choices, including:  Daily care of your teeth and gums.  Regular physical activity.  Eating a healthy diet.  Avoiding tobacco and drug use.  Limiting alcohol use.  Practicing safe sex.  Taking low doses of aspirin every day.  Taking vitamin and mineral supplements as recommended by your health care provider. What happens during an annual well check? The services and screenings done by your health care provider during your annual well check will depend on your age, overall health, lifestyle risk factors, and family history of disease. Counseling  Your health care provider may ask you questions about your:  Alcohol use.  Tobacco use.  Drug use.  Emotional well-being.  Home and relationship well-being.  Sexual activity.  Eating habits.  History of falls.  Memory and ability to understand (cognition).  Work and work Statistician. Screening  You may have the following tests or measurements:  Height, weight, and BMI.  Blood pressure.  Lipid and cholesterol levels. These may be checked every 5 years, or more frequently if you are over 60 years old.  Skin check.  Lung cancer screening. You may have this screening every year starting at age 72 if you have a 30-pack-year history of smoking and currently smoke or have quit within the past 15 years.  Fecal occult blood test (FOBT) of the stool. You may have this test every year starting at age 72.  Flexible sigmoidoscopy or colonoscopy. You may have a sigmoidoscopy every 5 years or a colonoscopy every 10  years starting at age 64.  Prostate cancer screening. Recommendations will vary depending on your family history and other risks.  Hepatitis  C blood test.  Hepatitis B blood test.  Sexually transmitted disease (STD) testing.  Diabetes screening. This is done by checking your blood sugar (glucose) after you have not eaten for a while (fasting). You may have this done every 1-3 years.  Abdominal aortic aneurysm (AAA) screening. You may need this if you are a current or former smoker.  Osteoporosis. You may be screened starting at age 14 if you are at high risk. Talk with your health care provider about your test results, treatment options, and if necessary, the need for more tests. Vaccines  Your health care provider may recommend certain vaccines, such as:  Influenza vaccine. This is recommended every year.  Tetanus, diphtheria, and acellular pertussis (Tdap, Td) vaccine. You may need a Td booster every 10 years.  Zoster vaccine. You may need this after age 69.  Pneumococcal 13-valent conjugate (PCV13) vaccine. One dose is recommended after age 88.  Pneumococcal polysaccharide (PPSV23) vaccine. One dose is recommended after age 2. Talk to your health care provider about which screenings and vaccines you need and how often you need them. This information is not intended to replace advice given to you by your health care provider. Make sure you discuss any questions you have with your health care provider. Document Released: 08/30/2015 Document Revised: 04/22/2016 Document Reviewed: 06/04/2015 Elsevier Interactive Patient Education  2017 [place] Prevention in the Home Falls can cause injuries. They can happen to people of all ages. There are many things you can do to make your home safe and to help prevent falls. What can I do on the outside of my home?  Regularly fix the edges of walkways and driveways and fix any cracks.  Remove anything that might make you trip as you walk through a door, such as a raised step or threshold.  Trim any bushes or trees on the path to your home.  Use bright outdoor  lighting.  Clear any walking paths of anything that might make someone trip, such as rocks or tools.  Regularly check to see if handrails are loose or broken. Make sure that both sides of any steps have handrails.  Any raised decks and porches should have guardrails on the edges.  Have any leaves, snow, or ice cleared regularly.  Use sand or salt on walking paths during winter.  Clean up any spills in your garage right away. This includes oil or grease spills. What can I do in the bathroom?  Use night lights.  Install grab bars by the toilet and in the tub and shower. Do not use towel bars as grab bars.  Use non-skid mats or decals in the tub or shower.  If you need to sit down in the shower, use a plastic, non-slip stool.  Keep the floor dry. Clean up any water that spills on the floor as soon as it happens.  Remove soap buildup in the tub or shower regularly.  Attach bath mats securely with double-sided non-slip rug tape.  Do not have throw rugs and other things on the floor that can make you trip. What can I do in the bedroom?  Use night lights.  Make sure that you have a light by your bed that is easy to reach.  Do not use any sheets or blankets that are too big for your bed. They should not hang  down onto the floor.  Have a firm chair that has side arms. You can use this for support while you get dressed.  Do not have throw rugs and other things on the floor that can make you trip. What can I do in the kitchen?  Clean up any spills right away.  Avoid walking on wet floors.  Keep items that you use a lot in easy-to-reach places.  If you need to reach something above you, use a strong step stool that has a grab bar.  Keep electrical cords out of the way.  Do not use floor polish or wax that makes floors slippery. If you must use wax, use non-skid floor wax.  Do not have throw rugs and other things on the floor that can make you trip. What can I do with my  stairs?  Do not leave any items on the stairs.  Make sure that there are handrails on both sides of the stairs and use them. Fix handrails that are broken or loose. Make sure that handrails are as long as the stairways.  Check any carpeting to make sure that it is firmly attached to the stairs. Fix any carpet that is loose or worn.  Avoid having throw rugs at the top or bottom of the stairs. If you do have throw rugs, attach them to the floor with carpet tape.  Make sure that you have a light switch at the top of the stairs and the bottom of the stairs. If you do not have them, ask someone to add them for you. What else can I do to help prevent falls?  Wear shoes that:  Do not have high heels.  Have rubber bottoms.  Are comfortable and fit you well.  Are closed at the toe. Do not wear sandals.  If you use a stepladder:  Make sure that it is fully opened. Do not climb a closed stepladder.  Make sure that both sides of the stepladder are locked into place.  Ask someone to hold it for you, if possible.  Clearly mark and make sure that you can see:  Any grab bars or handrails.  First and last steps.  Where the edge of each step is.  Use tools that help you move around (mobility aids) if they are needed. These include:  Canes.  Walkers.  Scooters.  Crutches.  Turn on the lights when you go into a dark area. Replace any light bulbs as soon as they burn out.  Set up your furniture so you have a clear path. Avoid moving your furniture around.  If any of your floors are uneven, fix them.  If there are any pets around you, be aware of where they are.  Review your medicines with your doctor. Some medicines can make you feel dizzy. This can increase your chance of falling. Ask your doctor what other things that you can do to help prevent falls. This information is not intended to replace advice given to you by your health care provider. Make sure you discuss any  questions you have with your health care provider. Document Released: 05/30/2009 Document Revised: 01/09/2016 Document Reviewed: 09/07/2014 Elsevier Interactive Patient Education  2017 [employer].

## 2020-05-27 NOTE — Telephone Encounter (Signed)
Patient was reached and AWV completed.

## 2020-06-05 ENCOUNTER — Telehealth: Payer: Self-pay | Admitting: Internal Medicine

## 2020-06-05 NOTE — Telephone Encounter (Signed)
Patient calling trying to get his lab results. Can be reached at # [phone]

## 2020-06-05 NOTE — Telephone Encounter (Signed)
Pt would like to know if he has Celiac's dz.  I read him the result note from 10/8, however, it looks at that time more labs were pending.  Will forward to PCP for review of additional resulted labs.

## 2020-06-06 NOTE — Telephone Encounter (Signed)
Pt's question addressed by Dr Alain Marion in lab result note & pt has been contacted by CMA.   Encounter closed.

## 2020-06-06 NOTE — Telephone Encounter (Signed)
Jaton, Pls see lab note Thx

## 2020-07-02 DIAGNOSIS — M542 Cervicalgia: Secondary | ICD-10-CM | POA: Diagnosis not present

## 2020-07-02 DIAGNOSIS — M533 Sacrococcygeal disorders, not elsewhere classified: Secondary | ICD-10-CM | POA: Diagnosis not present

## 2020-07-12 ENCOUNTER — Other Ambulatory Visit: Payer: Self-pay | Admitting: Internal Medicine

## 2020-07-23 DIAGNOSIS — M791 Myalgia, unspecified site: Secondary | ICD-10-CM | POA: Diagnosis not present

## 2020-07-23 DIAGNOSIS — M47812 Spondylosis without myelopathy or radiculopathy, cervical region: Secondary | ICD-10-CM | POA: Diagnosis not present

## 2020-09-11 ENCOUNTER — Telehealth: Payer: Self-pay | Admitting: Internal Medicine

## 2020-09-11 NOTE — Telephone Encounter (Signed)
° ° °  Patient wants to know if he needs another pneumonia vaccine

## 2020-09-11 NOTE — Telephone Encounter (Signed)
Called pt spoke w/wife inform him per chart pt is not due for pneumonia shot. Prevnar pneumonia done back in 2014, and that's a 1 time shot. The pneumonia 23 was given in "2018", they are given every 5-7 years depending on pt.Marland KitchenJohny Chess

## 2020-09-27 DIAGNOSIS — J984 Other disorders of lung: Secondary | ICD-10-CM | POA: Diagnosis not present

## 2020-09-27 DIAGNOSIS — R059 Cough, unspecified: Secondary | ICD-10-CM | POA: Diagnosis not present

## 2020-09-27 DIAGNOSIS — J22 Unspecified acute lower respiratory infection: Secondary | ICD-10-CM | POA: Diagnosis not present

## 2020-11-27 DIAGNOSIS — M533 Sacrococcygeal disorders, not elsewhere classified: Secondary | ICD-10-CM | POA: Diagnosis not present

## 2020-11-27 DIAGNOSIS — M791 Myalgia, unspecified site: Secondary | ICD-10-CM | POA: Diagnosis not present

## 2020-12-02 DIAGNOSIS — R0989 Other specified symptoms and signs involving the circulatory and respiratory systems: Secondary | ICD-10-CM | POA: Diagnosis not present

## 2020-12-02 DIAGNOSIS — I4510 Unspecified right bundle-branch block: Secondary | ICD-10-CM | POA: Diagnosis not present

## 2020-12-02 DIAGNOSIS — Z20822 Contact with and (suspected) exposure to covid-19: Secondary | ICD-10-CM | POA: Diagnosis not present

## 2020-12-02 DIAGNOSIS — J22 Unspecified acute lower respiratory infection: Secondary | ICD-10-CM | POA: Diagnosis not present

## 2020-12-02 DIAGNOSIS — R059 Cough, unspecified: Secondary | ICD-10-CM | POA: Diagnosis not present

## 2020-12-02 DIAGNOSIS — R0602 Shortness of breath: Secondary | ICD-10-CM | POA: Diagnosis not present

## 2020-12-03 DIAGNOSIS — I452 Bifascicular block: Secondary | ICD-10-CM | POA: Diagnosis not present

## 2020-12-10 ENCOUNTER — Other Ambulatory Visit: Payer: Self-pay

## 2020-12-10 ENCOUNTER — Encounter: Payer: Self-pay | Admitting: Internal Medicine

## 2020-12-10 ENCOUNTER — Ambulatory Visit (INDEPENDENT_AMBULATORY_CARE_PROVIDER_SITE_OTHER): Payer: Medicare Other | Admitting: Internal Medicine

## 2020-12-10 DIAGNOSIS — R059 Cough, unspecified: Secondary | ICD-10-CM | POA: Diagnosis not present

## 2020-12-10 DIAGNOSIS — J479 Bronchiectasis, uncomplicated: Secondary | ICD-10-CM

## 2020-12-10 DIAGNOSIS — Z23 Encounter for immunization: Secondary | ICD-10-CM | POA: Diagnosis not present

## 2020-12-10 DIAGNOSIS — L989 Disorder of the skin and subcutaneous tissue, unspecified: Secondary | ICD-10-CM | POA: Diagnosis not present

## 2020-12-10 DIAGNOSIS — I10 Essential (primary) hypertension: Secondary | ICD-10-CM

## 2020-12-10 MED ORDER — PROMETHAZINE-CODEINE 6.25-10 MG/5ML PO SYRP: 5.0000 mL | ORAL_SOLUTION | ORAL | 0 refills | Status: DC | PRN

## 2020-12-10 MED ORDER — DAPSONE 7.5 % EX GEL: 1.0000 "application " | Freq: Every day | CUTANEOUS | 1 refills | Status: DC

## 2020-12-10 MED ORDER — IPRATROPIUM-ALBUTEROL 0.5-2.5 (3) MG/3ML IN SOLN: 3.0000 mL | Freq: Four times a day (QID) | RESPIRATORY_TRACT | 3 refills | Status: DC | PRN

## 2020-12-10 NOTE — Assessment & Plan Note (Addendum)
Pt finished Doxy+Augmentin - better  Using HHN. CXR was ok (-). Re-start HHN Prom-cod syrup prn

## 2020-12-10 NOTE — Patient Instructions (Addendum)
Dewaine Oats, MD - 12/02/2020  Formatting of this note might be different from the original. XR CHEST PA AND LATERAL, 12/02/2020 4:01 PM  INDICATION: cough and increased chest congestion per normal. (-) covid. denies fevers. \ R09.89 Chest congestion \ R05.9 Cough   COMPARISON: 09/27/2020  FINDINGS:   Cardiovascular: Stable cardiomediastinal contours. Normal cardiac size.  Lungs/pleura: Redemonstrated chronic lung disease with coarsened pulmonary markings. No definite acute pulmonary infiltration, although sensitivity for detection is limited by chronic lung disease. No pleural effusion.  Upper abdomen: No acute abnormality.   Osseous structures: No acute abnormality. Remote right rib fractures.   CONCLUSION:  There is no evidence of acute cardiac or pulmonary abnormality.  Marland Kitchen

## 2020-12-10 NOTE — Assessment & Plan Note (Signed)
BP Readings from Last 3 Encounters:  12/10/20 (!) 162/70  05/23/20 124/70  02/06/20 (!) 164/78  Diet, exercise BP is nl at home

## 2020-12-10 NOTE — Progress Notes (Signed)
Subjective:  Patient ID: Curtis Jacobs, male    DOB: [birth year]  Age: 87 y.o. MRN: [id number]  CC: Cough (Pt states he have some chest congestion but not able to cough up)   HPI Curtis Jacobs presents for ?CAP on 11/30/20 - given Doxy+Augmentin - better F/u bronchiectases. Using HHN C/o cough, SOB - better  Outpatient Medications Prior to Visit  Medication Sig Dispense Refill  . Cholecalciferol 1000 UNITS tablet Take 2,000 Units by mouth daily.    . clotrimazole-betamethasone (LOTRISONE) cream Apply 1 application topically 2 (two) times daily. 135 g 3  . fluocinonide cream (LIDEX) 3.50 % Apply 1 application topically daily.    Marland Kitchen Flurandrenolide 4 MCG/SQCM TAPE Apply 1 each topically in the morning and at bedtime. Apply to lesions 1 each 3  . Glucosamine-Chondroit-Vit C-Mn (GLUCOSAMINE CHONDROITIN COMPLX) CAPS Take 1 capsule by mouth daily.    Marland Kitchen guaiFENesin (MUCINEX) 600 MG 12 hr tablet Take 2 tablets (1,200 mg total) by mouth 2 (two) times daily.    Marland Kitchen loratadine (CLARITIN) 10 MG tablet Take 10 mg by mouth daily.    . Multiple Vitamin (MULTIVITAMIN) tablet Take 1 tablet by mouth daily.    . mupirocin ointment (BACTROBAN) 2 %     . niacin 500 MG CR capsule Take 500 mg by mouth at bedtime.    [name] POWD Take by mouth daily.    Marland Kitchen terazosin (HYTRIN) 10 MG capsule TAKE 1 CAPSULE BY MOUTH EVERY NIGHT AT BEDTIME 90 capsule 3  . triamcinolone ointment (KENALOG) 0.1 % APPLY EXTERNALLY TO THE AFFECTED AREA THREE TIMES DAILY 80 g 3  . vitamin C (ASCORBIC ACID) 500 MG tablet Take 500 mg by mouth daily.    . methylPREDNISolone (MEDROL DOSEPAK) 4 MG TBPK tablet As directed (Patient not taking: Reported on 12/10/2020) 21 tablet 0  . methylPREDNISolone acetate (DEPO-MEDROL) 20 MG/ML injection 20 mg     . methylPREDNISolone acetate (DEPO-MEDROL) injection 40 mg     . methylPREDNISolone acetate (DEPO-MEDROL) injection 40 mg      No facility-administered medications prior to visit.     ROS: Review of Systems  Constitutional: Positive for fatigue. Negative for appetite change and unexpected weight change.  HENT: Negative for congestion, nosebleeds, sneezing, sore throat and trouble swallowing.   Eyes: Negative for itching and visual disturbance.  Respiratory: Positive for cough and shortness of breath.   Cardiovascular: Negative for chest pain, palpitations and leg swelling.  Gastrointestinal: Negative for abdominal distention, blood in stool, diarrhea and nausea.  Genitourinary: Negative for frequency and hematuria.  Musculoskeletal: Positive for arthralgias. Negative for back pain, gait problem, joint swelling and neck pain.  Skin: Negative for rash.  Neurological: Negative for dizziness, tremors, speech difficulty and weakness.  Psychiatric/Behavioral: Negative for agitation, dysphoric mood, sleep disturbance and suicidal ideas. The patient is not nervous/anxious.     Objective:  BP (!) 162/70 (BP Location: Left Arm)   Pulse 69   Temp 97.9 F (36.6 C) (Oral)   Ht 5\' 10"  (1.778 m)   Wt 199 lb 9.6 oz (90.5 kg)   SpO2 96%   BMI 28.64 kg/m   BP Readings from Last 3 Encounters:  12/10/20 (!) 162/70  05/23/20 124/70  02/06/20 (!) 164/78    Wt Readings from Last 3 Encounters:  12/10/20 199 lb 9.6 oz (90.5 kg)  05/23/20 194 lb (88 kg)  02/06/20 201 lb (91.2 kg)    Physical Exam Constitutional:      General:  He is not in acute distress.    Appearance: He is well-developed.     Comments: NAD  Eyes:     Conjunctiva/sclera: Conjunctivae normal.     Pupils: Pupils are equal, round, and reactive to light.  Neck:     Thyroid: No thyromegaly.     Vascular: No JVD.  Cardiovascular:     Rate and Rhythm: Normal rate and regular rhythm.     Heart sounds: Normal heart sounds. No murmur heard. No friction rub. No gallop.   Pulmonary:     Effort: Pulmonary effort is normal. No respiratory distress.     Breath sounds: Normal breath sounds. No wheezing or  rales.  Chest:     Chest wall: No tenderness.  Abdominal:     General: Bowel sounds are normal. There is no distension.     Palpations: Abdomen is soft. There is no mass.     Tenderness: There is no abdominal tenderness. There is no guarding or rebound.  Musculoskeletal:        General: No tenderness. Normal range of motion.     Cervical back: Normal range of motion.  Lymphadenopathy:     Cervical: No cervical adenopathy.  Skin:    General: Skin is warm and dry.     Findings: No rash.  Neurological:     Mental Status: He is alert and oriented to person, place, and time.     Cranial Nerves: No cranial nerve deficit.     Motor: No abnormal muscle tone.     Coordination: Coordination normal.     Gait: Gait normal.     Deep Tendon Reflexes: Reflexes are normal and symmetric.  Psychiatric:        Behavior: Behavior normal.        Thought Content: Thought content normal.        Judgment: Judgment normal.    Hiatt, Jaquita Rector, MD - 12/02/2020  Formatting of this note might be different from the original. XR CHEST PA AND LATERAL, 12/02/2020 4:01 PM  INDICATION: cough and increased chest congestion per normal. (-) covid. denies fevers. \ R09.89 Chest congestion \ R05.9 Cough   COMPARISON: 09/27/2020  FINDINGS:   Cardiovascular: Stable cardiomediastinal contours. Normal cardiac size.  Lungs/pleura: Redemonstrated chronic lung disease with coarsened pulmonary markings. No definite acute pulmonary infiltration, although sensitivity for detection is limited by chronic lung disease. No pleural effusion.  Upper abdomen: No acute abnormality.   Osseous structures: No acute abnormality. Remote right rib fractures.   CONCLUSION:  There is no evidence of acute cardiac or pulmonary abnormality. . Lab Results  Component Value Date   WBC 5.3 02/02/2019   HGB 14.4 02/02/2019   HCT 42.6 02/02/2019   PLT 177.0 02/02/2019   GLUCOSE 96 05/23/2020   CHOL 191 01/27/2018   TRIG 144.0  01/27/2018   HDL 36.20 (L) 01/27/2018   LDLDIRECT 101.0 01/10/2015   LDLCALC 126 (H) 01/27/2018   ALT 17 05/23/2020   AST 17 05/23/2020   NA 140 05/23/2020   K 4.1 05/23/2020   CL 107 05/23/2020   CREATININE 1.24 05/23/2020   BUN 20 05/23/2020   CO2 30 05/23/2020   TSH 0.55 07/28/2018   PSA 3.55 07/28/2018   HGBA1C 5.8 02/02/2019    VAS US CAROTID  Result Date: 08/15/2019 Carotid Arterial Duplex Study Indications:       Bilateral bruits and patient has a history of chronic  headaches due to an old injury from an accident resulting in                    issues with the cervical spine. He c/o intermittent dizziness                    when coming up too quickly from a bended position. He denies                    any other cerebrovascular symptoms. Risk Factors:      Hypertension, hyperlipidemia, past history of smoking. Comparison Study:  NA Performing Technologist: Sharlett Iles RVT  Examination Guidelines: A complete evaluation includes B-mode imaging, spectral Doppler, color Doppler, and power Doppler as needed of all accessible portions of each vessel. Bilateral testing is considered an integral part of a complete examination. Limited examinations for reoccurring indications may be performed as noted.  Right Carotid Findings: +----------+--------+--------+--------+------------------+--------+           PSV cm/sEDV cm/sStenosisPlaque DescriptionComments +----------+--------+--------+--------+------------------+--------+ CCA Prox  116     7                                          +----------+--------+--------+--------+------------------+--------+ CCA Distal70      9                                          +----------+--------+--------+--------+------------------+--------+ ICA Prox  63      7                                          +----------+--------+--------+--------+------------------+--------+ ICA Mid   73      17                                          +----------+--------+--------+--------+------------------+--------+ ICA Distal60      11                                         +----------+--------+--------+--------+------------------+--------+ ECA       81      8               heterogenous               +----------+--------+--------+--------+------------------+--------+ +----------+--------+-------+----------------+-------------------+           PSV cm/sEDV cmsDescribe        Arm Pressure (mmHG) +----------+--------+-------+----------------+-------------------+ [id number]            Multiphasic, [id number]                 +----------+--------+-------+----------------+-------------------+ +---------+--------+--+--------+-+---------+ VertebralPSV cm/s49EDV cm/s7Antegrade +---------+--------+--+--------+-+---------+  Left Carotid Findings: +----------+--------+--------+--------+------------------+--------+           PSV cm/sEDV cm/sStenosisPlaque DescriptionComments +----------+--------+--------+--------+------------------+--------+ CCA Prox  102     16                                         +----------+--------+--------+--------+------------------+--------+  CCA Distal64      9                                          +----------+--------+--------+--------+------------------+--------+ ICA Prox  71      9                                          +----------+--------+--------+--------+------------------+--------+ ICA Mid   70      16                                         +----------+--------+--------+--------+------------------+--------+ ICA Distal78      18                                         +----------+--------+--------+--------+------------------+--------+ ECA       91      10                                         +----------+--------+--------+--------+------------------+--------+ +----------+--------+--------+----------------+-------------------+            PSV cm/sEDV cm/sDescribe        Arm Pressure (mmHG) +----------+--------+--------+----------------+-------------------+ [id number]             Multiphasic, [id number]                 +----------+--------+--------+----------------+-------------------+ +---------+--------+--+--------+-+---------+ VertebralPSV cm/s52EDV cm/s9Antegrade +---------+--------+--+--------+-+---------+  Summary: Right Carotid: There was no evidence of thrombus, dissection, atherosclerotic                plaque or stenosis in the cervical carotid system. Left Carotid: There was no evidence of thrombus, dissection, atherosclerotic               plaque or stenosis in the cervical carotid system. Vertebrals:  Bilateral vertebral arteries demonstrate antegrade flow. Subclavians: Normal flow hemodynamics were seen in bilateral subclavian              arteries. *See table(s) above for measurements and observations.  Electronically signed by Jenkins Rouge MD on 08/15/2019 at 3:24:05 PM.    Final     Assessment & Plan:   Walker Kehr, MD

## 2020-12-10 NOTE — Assessment & Plan Note (Addendum)
11/30/20 - finished Doxy+Augmentin - better  Using HHN. CXR was ok (-). Re-start HHN Prevnar Pulm ref

## 2020-12-10 NOTE — Addendum Note (Signed)
Addended by: Earnstine Regal on: 12/10/2020 02:57 PM   Modules accepted: Orders

## 2020-12-10 NOTE — Assessment & Plan Note (Signed)
Try Dapsone gel off lable

## 2021-01-29 ENCOUNTER — Encounter: Payer: Self-pay | Admitting: Pulmonary Disease

## 2021-01-29 ENCOUNTER — Other Ambulatory Visit: Payer: Self-pay

## 2021-01-29 ENCOUNTER — Ambulatory Visit: Payer: Medicare Other | Admitting: Pulmonary Disease

## 2021-01-29 VITALS — BP 132/68 | HR 81 | Ht 70.0 in | Wt 199.0 lb

## 2021-01-29 DIAGNOSIS — R918 Other nonspecific abnormal finding of lung field: Secondary | ICD-10-CM | POA: Diagnosis not present

## 2021-01-29 DIAGNOSIS — J479 Bronchiectasis, uncomplicated: Secondary | ICD-10-CM | POA: Diagnosis not present

## 2021-01-29 DIAGNOSIS — Z8701 Personal history of pneumonia (recurrent): Secondary | ICD-10-CM

## 2021-01-29 DIAGNOSIS — Z9889 Other specified postprocedural states: Secondary | ICD-10-CM | POA: Diagnosis not present

## 2021-01-29 DIAGNOSIS — R911 Solitary pulmonary nodule: Secondary | ICD-10-CM

## 2021-01-29 NOTE — Patient Instructions (Signed)
Thank you for visiting Dr. Valeta Harms at [facility]. Today we recommend the following:  Orders Placed This Encounter  Procedures   CT Chest Wo Contrast   Next available for CT Chest   Return in about 4 weeks (around 02/26/2021) for with APP.    Please do your part to reduce the spread of COVID-19.

## 2021-01-29 NOTE — Progress Notes (Signed)
Synopsis: Referred in June 2022 for lung nodule by Plotnikov, Evie Lacks, MD  Subjective:   PATIENT ID: Curtis Jacobs GENDER: male DOB: [birth year], MRN: [id number]  Chief Complaint  Patient presents with   Consult    Pulmonary Nodule, Bronchiectasis, McQuaid patient    87 year old gentleman, past medical history of BPH, hypertension, hyperlipidemia.  Patient has been been followed for many years in the past by Dr. Lake Bells.  Last CT scan of the chest was in 2016 to follow a left upper lobe pulmonary nodule.  He had 1 nodule resected in the right upper lobe many years ago that was benign.  From a respiratory standpoint he has been doing well he uses his nebulizer rarely.  He tries to get up every single day with a airway clearance routine.  Lots of coughing and clearing of his chest.  He starts with a dry cough in the morning usually becomes more productive for about 15 to 20 minutes of airway clearance.  Then he is fine for the rest of the day.  He does take guaifenesin daily.  He has been on this also for several years.  Of note he states he had pneumonia that almost killed him many years ago was in the ICU.  Felt to have bronchiectasis and scarring related to his previous illness.  Occupation: Patient is a retired Dealer, Chief Financial Officer.   Past Medical History:  Diagnosis Date   BPH (benign prostatic hypertrophy)    Dr. Risa Grill   HA (headache)    Traction- Dr. Jannifer Franklin- had MRI.MRA   HTN (hypertension)    Hyperlipidemia      Family History  Problem Relation Age of Onset   Heart disease Father      Past Surgical History:  Procedure Laterality Date   EXCISIONAL HEMORRHOIDECTOMY      Social History   Socioeconomic History   Marital status: Married    Spouse name: Not on file   Number of children: Not on file   Years of education: Not on file   Highest education level: Not on file  Occupational History   Not on file  Tobacco Use   Smoking status: Former    Packs/day: 0.10     Years: 3.00    Pack years: 0.30    Types: Cigarettes    Quit date: 07/25/1952    Years since quitting: 68.5   Smokeless tobacco: Never   Tobacco comments:    used to "smoke but not inhale"  Substance and Sexual Activity   Alcohol use: No   Drug use: No   Sexual activity: Yes  Other Topics Concern   Not on file  Social History Narrative   Not on file   Social Determinants of Health   Financial Resource Strain: Low Risk    Difficulty of Paying Living Expenses: Not hard at all  Food Insecurity: No Food Insecurity   Worried About Charity fundraiser in the Last Year: Never true   [place] in the Last Year: Never true  Transportation Needs: No Transportation Needs   Lack of Transportation (Medical): No   Lack of Transportation (Non-Medical): No  Physical Activity: Sufficiently Active   Days of Exercise per Week: 5 days   Minutes of Exercise per Session: 30 min  Stress: No Stress Concern Present   Feeling of Stress : Not at all  Social Connections: Socially Integrated   Frequency of Communication with Friends and Family: More than three times a week  Frequency of Social Gatherings with Friends and Family: Once a week   Attends Religious Services: More than 4 times per year   Active Member of [employer] or Organizations: No   Attends Music therapist: More than 4 times per year   Marital Status: Married  Human resources officer Violence: Not on file     Allergies  Allergen Reactions   Cordran [Flurandrenolide]     rash   Lisinopril     REACTION: cough     Outpatient Medications Prior to Visit  Medication Sig Dispense Refill   Cholecalciferol 1000 UNITS tablet Take 2,000 Units by mouth daily.     clotrimazole-betamethasone (LOTRISONE) cream Apply 1 application topically 2 (two) times daily. 135 g 3   Dapsone 7.5 % GEL Apply 1 application topically daily. 90 g 1   fluocinonide cream (LIDEX) 1.88 % Apply 1 application topically daily.      Glucosamine-Chondroit-Vit C-Mn (GLUCOSAMINE CHONDROITIN COMPLX) CAPS Take 1 capsule by mouth daily.     guaiFENesin (MUCINEX) 600 MG 12 hr tablet Take 2 tablets (1,200 mg total) by mouth 2 (two) times daily.     ipratropium-albuterol (DUONEB) 0.5-2.5 (3) MG/3ML SOLN Take 3 mLs by nebulization every 6 (six) hours as needed. 120 mL 3   loratadine (CLARITIN) 10 MG tablet Take 10 mg by mouth daily.     Multiple Vitamin (MULTIVITAMIN) tablet Take 1 tablet by mouth daily.     mupirocin ointment (BACTROBAN) 2 %      niacin 500 MG CR capsule Take 500 mg by mouth at bedtime.     promethazine-codeine (PHENERGAN WITH CODEINE) 6.25-10 MG/5ML syrup Take 5 mLs by mouth every 4 (four) hours as needed. 300 mL 0   Saw Palmetto POWD Take by mouth daily.     terazosin (HYTRIN) 10 MG capsule TAKE 1 CAPSULE BY MOUTH EVERY NIGHT AT BEDTIME 90 capsule 3   triamcinolone ointment (KENALOG) 0.1 % APPLY EXTERNALLY TO THE AFFECTED AREA THREE TIMES DAILY 80 g 3   vitamin C (ASCORBIC ACID) 500 MG tablet Take 500 mg by mouth daily.     No facility-administered medications prior to visit.    Review of Systems  Constitutional:  Negative for chills, fever, malaise/fatigue and weight loss.  HENT:  Negative for hearing loss, sore throat and tinnitus.   Eyes:  Negative for blurred vision and double vision.  Respiratory:  Positive for cough and sputum production. Negative for hemoptysis, shortness of breath, wheezing and stridor.   Cardiovascular:  Negative for chest pain, palpitations, orthopnea, leg swelling and PND.  Gastrointestinal:  Negative for abdominal pain, constipation, diarrhea, heartburn, nausea and vomiting.  Genitourinary:  Negative for dysuria, hematuria and urgency.  Musculoskeletal:  Negative for joint pain and myalgias.  Skin:  Negative for itching and rash.  Neurological:  Negative for dizziness, tingling, weakness and headaches.  Endo/Heme/Allergies:  Negative for environmental allergies. Does not  bruise/bleed easily.  Psychiatric/Behavioral:  Negative for depression. The patient is not nervous/anxious and does not have insomnia.   All other systems reviewed and are negative.   Objective:  Physical Exam Vitals reviewed.  Constitutional:      General: He is not in acute distress.    Appearance: He is well-developed.  HENT:     Head: Normocephalic and atraumatic.  Eyes:     General: No scleral icterus.    Conjunctiva/sclera: Conjunctivae normal.     Pupils: Pupils are equal, round, and reactive to light.  Neck:     Vascular:  No JVD.     Trachea: No tracheal deviation.  Cardiovascular:     Rate and Rhythm: Normal rate and regular rhythm.     Heart sounds: Normal heart sounds. No murmur heard. Pulmonary:     Effort: Pulmonary effort is normal. No tachypnea, accessory muscle usage or respiratory distress.     Breath sounds: Normal breath sounds. No stridor. No wheezing, rhonchi or rales.  Abdominal:     General: Bowel sounds are normal. There is no distension.     Palpations: Abdomen is soft.     Tenderness: There is no abdominal tenderness.  Musculoskeletal:        General: No tenderness.     Cervical back: Neck supple.  Lymphadenopathy:     Cervical: No cervical adenopathy.  Skin:    General: Skin is warm and dry.     Capillary Refill: Capillary refill takes less than 2 seconds.     Findings: No rash.  Neurological:     Mental Status: He is alert and oriented to person, place, and time.  Psychiatric:        Behavior: Behavior normal.     Vitals:   01/29/21 1106  BP: 132/68  Pulse: 81  SpO2: 96%  Weight: 199 lb (90.3 kg)  Height: 5\' 10"  (1.778 m)   96% on RA BMI Readings from Last 3 Encounters:  01/29/21 28.55 kg/m  12/10/20 28.64 kg/m  05/23/20 27.84 kg/m   Wt Readings from Last 3 Encounters:  01/29/21 199 lb (90.3 kg)  12/10/20 199 lb 9.6 oz (90.5 kg)  05/23/20 194 lb (88 kg)     CBC    Component Value Date/Time   WBC 5.3 02/02/2019 1034    RBC 4.55 02/02/2019 1034   HGB 14.4 02/02/2019 1034   HCT 42.6 02/02/2019 1034   PLT 177.0 02/02/2019 1034   MCV 93.7 02/02/2019 1034   MCHC 33.8 02/02/2019 1034   RDW 13.3 02/02/2019 1034   LYMPHSABS 1.5 02/02/2019 1034   MONOABS 0.4 02/02/2019 1034   EOSABS 0.1 02/02/2019 1034   BASOSABS 0.0 02/02/2019 1034    Chest Imaging: 07/15/2015 CT chest: Bilateral pulmonary nodules, areas of bronchiectasis and scarring. The patient's images have been independently reviewed by me.    Pulmonary Functions Testing Results: No flowsheet data found.  FeNO:   Pathology:   Echocardiogram:   Heart Catheterization:     Assessment & Plan:     ICD-10-CM   1. Bronchiectasis without acute exacerbation (HCC)  J47.9 CT Chest Wo Contrast    2. Lung nodule  R91.1 CT Chest Wo Contrast    3. Abnormal CT scan of lung  R91.8     4. History of pneumonia  Z87.01     5. History of lung surgery  Z98.890       Discussion:  This is a 87 year old gentleman followed for a long time in the pulmonary clinic.  He has had bilateral pulmonary nodules for some time as well as associated bronchiectasis.  He had a bad pneumonia that led to hospitalization and ICU stay.  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  Otherwise treated conservatively with airway clearance techniques and symptom management with guaifenesin.  Plan: Continue guaifenesin for symptom management Continue duo nebs as needed We will obtain a repeat noncontrasted CT scan of the chest to follow-up on his bronchiectasis and nodules. Continue daily airway clearance techniques. At some point may benefit from a flutter valve. Return to clinic in 3 to 4 weeks after CT  scan. Can review CT scan images with me or APP.    Current Outpatient Medications:    Cholecalciferol 1000 UNITS tablet, Take 2,000 Units by mouth daily., Disp: , Rfl:    clotrimazole-betamethasone (LOTRISONE) cream, Apply 1 application topically 2 (two) times daily.,  Disp: 135 g, Rfl: 3   Dapsone 7.5 % GEL, Apply 1 application topically daily., Disp: 90 g, Rfl: 1   fluocinonide cream (LIDEX) 8.55 %, Apply 1 application topically daily., Disp: , Rfl:    Glucosamine-Chondroit-Vit C-Mn (GLUCOSAMINE CHONDROITIN COMPLX) CAPS, Take 1 capsule by mouth daily., Disp: , Rfl:    guaiFENesin (MUCINEX) 600 MG 12 hr tablet, Take 2 tablets (1,200 mg total) by mouth 2 (two) times daily., Disp: , Rfl:    ipratropium-albuterol (DUONEB) 0.5-2.5 (3) MG/3ML SOLN, Take 3 mLs by nebulization every 6 (six) hours as needed., Disp: 120 mL, Rfl: 3   loratadine (CLARITIN) 10 MG tablet, Take 10 mg by mouth daily., Disp: , Rfl:    Multiple Vitamin (MULTIVITAMIN) tablet, Take 1 tablet by mouth daily., Disp: , Rfl:    mupirocin ointment (BACTROBAN) 2 %, , Disp: , Rfl:    niacin 500 MG CR capsule, Take 500 mg by mouth at bedtime., Disp: , Rfl:    promethazine-codeine (PHENERGAN WITH CODEINE) 6.25-10 MG/5ML syrup, Take 5 mLs by mouth every 4 (four) hours as needed., Disp: 300 mL, Rfl: 0   Saw Palmetto POWD, Take by mouth daily., Disp: , Rfl:    terazosin (HYTRIN) 10 MG capsule, TAKE 1 CAPSULE BY MOUTH EVERY NIGHT AT BEDTIME, Disp: 90 capsule, Rfl: 3   triamcinolone ointment (KENALOG) 0.1 %, APPLY EXTERNALLY TO THE AFFECTED AREA THREE TIMES DAILY, Disp: 80 g, Rfl: 3   vitamin C (ASCORBIC ACID) 500 MG tablet, Take 500 mg by mouth daily., Disp: , Rfl:     Garner Nash, DO [facility] 01/29/2021 11:28 AM

## 2021-02-04 ENCOUNTER — Other Ambulatory Visit: Payer: Medicare Other

## 2021-02-05 ENCOUNTER — Ambulatory Visit (INDEPENDENT_AMBULATORY_CARE_PROVIDER_SITE_OTHER)
Admission: RE | Admit: 2021-02-05 | Discharge: 2021-02-05 | Disposition: A | Discharge status: Home / Self Care | Payer: Medicare Other | Source: Ambulatory Visit | Attending: Pulmonary Disease | Admitting: Pulmonary Disease

## 2021-02-05 ENCOUNTER — Other Ambulatory Visit: Payer: Self-pay

## 2021-02-05 DIAGNOSIS — J479 Bronchiectasis, uncomplicated: Secondary | ICD-10-CM

## 2021-02-05 DIAGNOSIS — I2510 Atherosclerotic heart disease of native coronary artery without angina pectoris: Secondary | ICD-10-CM | POA: Diagnosis not present

## 2021-02-05 DIAGNOSIS — K449 Diaphragmatic hernia without obstruction or gangrene: Secondary | ICD-10-CM | POA: Diagnosis not present

## 2021-02-05 DIAGNOSIS — R911 Solitary pulmonary nodule: Secondary | ICD-10-CM | POA: Diagnosis not present

## 2021-02-05 DIAGNOSIS — I77810 Thoracic aortic ectasia: Secondary | ICD-10-CM | POA: Diagnosis not present

## 2021-02-19 ENCOUNTER — Ambulatory Visit: Payer: Medicare Other | Admitting: Adult Health

## 2021-02-20 DIAGNOSIS — M65332 Trigger finger, left middle finger: Secondary | ICD-10-CM | POA: Diagnosis not present

## 2021-02-25 DIAGNOSIS — M791 Myalgia, unspecified site: Secondary | ICD-10-CM | POA: Diagnosis not present

## 2021-02-25 DIAGNOSIS — M533 Sacrococcygeal disorders, not elsewhere classified: Secondary | ICD-10-CM | POA: Diagnosis not present

## 2021-02-26 ENCOUNTER — Ambulatory Visit: Payer: Medicare Other | Admitting: Primary Care

## 2021-02-26 ENCOUNTER — Encounter: Payer: Self-pay | Admitting: Primary Care

## 2021-02-26 ENCOUNTER — Other Ambulatory Visit: Payer: Self-pay

## 2021-02-26 VITALS — BP 146/70 | HR 77 | Ht 70.0 in | Wt 197.0 lb

## 2021-02-26 DIAGNOSIS — I2510 Atherosclerotic heart disease of native coronary artery without angina pectoris: Secondary | ICD-10-CM | POA: Insufficient documentation

## 2021-02-26 DIAGNOSIS — J479 Bronchiectasis, uncomplicated: Secondary | ICD-10-CM | POA: Diagnosis not present

## 2021-02-26 DIAGNOSIS — R911 Solitary pulmonary nodule: Secondary | ICD-10-CM

## 2021-02-26 DIAGNOSIS — R918 Other nonspecific abnormal finding of lung field: Secondary | ICD-10-CM | POA: Diagnosis not present

## 2021-02-26 DIAGNOSIS — J471 Bronchiectasis with (acute) exacerbation: Secondary | ICD-10-CM | POA: Diagnosis not present

## 2021-02-26 MED ORDER — SODIUM CHLORIDE 3 % IN NEBU: INHALATION_SOLUTION | RESPIRATORY_TRACT | 12 refills | Status: DC | PRN

## 2021-02-26 NOTE — Progress Notes (Signed)
@Patient  ID: Curtis Jacobs, male    DOB: 1933-05-04, 87 y.o.   MRN: [id number]  Chief Complaint  Patient presents with   Follow-up    No concerns,     Referring provider: Plotnikov, Evie Lacks, MD  HPI: 87 year old male, smoker light quit December 1953.  Past medical history significant for bronchiectasis, left pulmonary nodule, hypertension, right bundle branch block, melanoma, dyslipidemia.  Patient of Dr. Valeta Harms, last seen in office on 01/29/2021.  02/26/2021 Patient presents today for 4-week follow-up.  He has a history of bilateral pulmonary nodules with associated bronchiectasis.  He had a bad pneumonia in the 1970s which led to hospitalization and ICU stay. 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  Otherwise stable, Dr. Valeta Harms recommended conservatively managing with airway clearance techniques and symptom management.  He was ordered for noncontrast CT chest.  He is doing well today, no acute complaints. States that every morning he has a dry cough which lasts for 15 mins before he is able to loosen chest congestion. Cough is never productive. He is taking mucinex 1200mg  once a day. He is not using his flutter valve consistently due to the fact that he does not feel that it helps much. We reviewed CT results as noted below, recommend referral to cardiology d.t three vessel disease.   CT chest on 02/06/2021 showed widespread moderate bronchiectasis predominantly in upper lobes, not substantially changed.  Previously noted bilateral pulmonary nodules from 2016 are stable and considered benign.  He had some additional new indeterminant pulmonary nodules in the posterior right upper lobe, largest measuring 0.6 cm, most likely represent mucoid impaction.  Suggest follow-up with noncontrast CT chest in 6 months. Incidental findings three vessel coronary atherosclerosis, aortic atherosclerosis, small hiatal hernia and cholelithiasis.    Allergies  Allergen Reactions   Cordran [Flurandrenolide]  Rash    rash   Lisinopril Cough    REACTION: cough    Immunization History  Administered Date(s) Administered   Influenza Split 05/29/2011, 05/17/2012   Influenza Whole 07/07/2004, 05/24/2010   Influenza, High Dose Seasonal PF 06/03/2015, 05/19/2016, 05/19/2017, 05/19/2018, 05/10/2019   Influenza-Unspecified 06/16/2013, 04/17/2014   Moderna Sars-Covid-2 Vaccination 09/29/2019, 10/27/2019, 06/26/2020   Pneumococcal Conjugate-13 07/07/2013, 12/10/2020   Pneumococcal Polysaccharide-23 06/07/2009, 01/14/2016, 11/30/2016   Td 06/17/2012   Tdap 11/04/2015   Zoster Recombinat (Shingrix) 02/08/2018, 05/19/2018   Zoster, Live 08/19/2010    Past Medical History:  Diagnosis Date   BPH (benign prostatic hypertrophy)    Dr. Risa Grill   HA (headache)    Traction- Dr. Jannifer Franklin- had MRI.MRA   HTN (hypertension)    Hyperlipidemia     Tobacco History: Social History   Tobacco Use  Smoking Status Former   Packs/day: 0.10   Years: 3.00   Pack years: 0.30   Types: Cigarettes   Quit date: 07/25/1952   Years since quitting: 68.6  Smokeless Tobacco Never  Tobacco Comments   used to "smoke but not inhale"   Counseling given: Not Answered Tobacco comments: used to "smoke but not inhale"   Outpatient Medications Prior to Visit  Medication Sig Dispense Refill   Cholecalciferol 1000 UNITS tablet Take 2,000 Units by mouth daily.     clotrimazole-betamethasone (LOTRISONE) cream Apply 1 application topically 2 (two) times daily. 135 g 3   Dapsone 7.5 % GEL Apply 1 application topically daily. 90 g 1   fluocinonide cream (LIDEX) 3.30 % Apply 1 application topically daily.     Glucosamine-Chondroit-Vit C-Mn (GLUCOSAMINE CHONDROITIN COMPLX) CAPS Take 1 capsule by  mouth daily.     guaiFENesin (MUCINEX) 600 MG 12 hr tablet Take 2 tablets (1,200 mg total) by mouth 2 (two) times daily. (Patient taking differently: Take 1,200 mg by mouth daily.)     ipratropium-albuterol (DUONEB) 0.5-2.5 (3) MG/3ML SOLN  Take 3 mLs by nebulization every 6 (six) hours as needed. 120 mL 3   loratadine (CLARITIN) 10 MG tablet Take 10 mg by mouth daily.     Multiple Vitamin (MULTIVITAMIN) tablet Take 1 tablet by mouth daily.     mupirocin ointment (BACTROBAN) 2 %      niacin 500 MG CR capsule Take 500 mg by mouth at bedtime.     Saw Palmetto POWD Take by mouth daily.     terazosin (HYTRIN) 10 MG capsule TAKE 1 CAPSULE BY MOUTH EVERY NIGHT AT BEDTIME 90 capsule 3   triamcinolone ointment (KENALOG) 0.1 % APPLY EXTERNALLY TO THE AFFECTED AREA THREE TIMES DAILY 80 g 3   vitamin C (ASCORBIC ACID) 500 MG tablet Take 500 mg by mouth daily.     promethazine-codeine (PHENERGAN WITH CODEINE) 6.25-10 MG/5ML syrup Take 5 mLs by mouth every 4 (four) hours as needed. (Patient not taking: Reported on 02/26/2021) 300 mL 0   No facility-administered medications prior to visit.    Review of Systems  Review of Systems  Constitutional: Negative.   HENT:  Positive for congestion.   Respiratory:  Positive for cough and shortness of breath.     Physical Exam  BP (!) 146/70 (BP Location: Left Arm, Patient Position: Sitting, Cuff Size: Normal)   Pulse 77   Ht 5\' 10"  (1.778 m)   Wt 197 lb (89.4 kg)   SpO2 97%   BMI 28.27 kg/m  Physical Exam Constitutional:      Appearance: Normal appearance.  HENT:     Head: Normocephalic and atraumatic.     Mouth/Throat:     Mouth: Mucous membranes are moist.     Pharynx: Oropharynx is clear.  Cardiovascular:     Rate and Rhythm: Normal rate and regular rhythm.  Pulmonary:     Effort: Pulmonary effort is normal.     Breath sounds: Rhonchi present.  Musculoskeletal:        General: Normal range of motion.  Skin:    General: Skin is warm and dry.  Neurological:     General: No focal deficit present.     Mental Status: He is alert and oriented to person, place, and time. Mental status is at baseline.     Lab Results:  CBC    Component Value Date/Time   WBC 5.3 02/02/2019  1034   RBC 4.55 02/02/2019 1034   HGB 14.4 02/02/2019 1034   HCT 42.6 02/02/2019 1034   PLT 177.0 02/02/2019 1034   MCV 93.7 02/02/2019 1034   MCHC 33.8 02/02/2019 1034   RDW 13.3 02/02/2019 1034   LYMPHSABS 1.5 02/02/2019 1034   MONOABS 0.4 02/02/2019 1034   EOSABS 0.1 02/02/2019 1034   BASOSABS 0.0 02/02/2019 1034    BMET    Component Value Date/Time   NA 140 05/23/2020 1415   K 4.1 05/23/2020 1415   CL 107 05/23/2020 1415   CO2 30 05/23/2020 1415   GLUCOSE 96 05/23/2020 1415   BUN 20 05/23/2020 1415   CREATININE 1.24 05/23/2020 1415   CALCIUM 9.7 05/23/2020 1415   GFRNONAA 73.97 08/19/2010 0905   GFRAA 85 03/19/2008 0814    BNP No results found for: BNP  ProBNP No results found  for: PROBNP  Imaging: CT Chest Wo Contrast  Result Date: 02/06/2021 CLINICAL DATA:  Follow-up pulmonary nodules and bronchiectasis. EXAM: CT CHEST WITHOUT CONTRAST TECHNIQUE: Multidetector CT imaging of the chest was performed following the standard protocol without IV contrast. COMPARISON:  07/15/2015 chest CT.  08/08/2019 chest radiograph. FINDINGS: Cardiovascular: Normal heart size. No significant pericardial effusion/thickening. Three-vessel coronary atherosclerosis. Atherosclerotic thoracic aorta with stable dilated 4.0 cm maximum diameter ascending thoracic aorta. Normal caliber pulmonary arteries. Mediastinum/Nodes: Stable subcentimeter hypodense posterior right thyroid nodule. Not clinically significant; no follow-up imaging recommended (ref: J Am Coll Radiol. 2015 Feb;12(2): 143-50). Unremarkable esophagus. No pathologically enlarged axillary, mediastinal or hilar lymph nodes, noting limited sensitivity for the detection of hilar adenopathy on this noncontrast study. Lungs/Pleura: No pneumothorax. No pleural effusion. Widespread patchy regions of moderate cylindrical and varicoid bronchiectasis in both lungs predominantly in the upper lobes with relative sparing of the lower lungs, not  substantially changed. Scattered regions of mucoid impaction at the areas of bronchiectasis. Several previously visualized scattered solid pulmonary nodules in the upper lobes bilaterally, largest 1.2 cm in the peripheral left upper lobe (series 3/image 58), all stable and considered benign. A few new scattered pulmonary nodules in the posterior apical right upper lobe, largest 0.6 cm (series 3/image 26), favor foci of mucoid impaction. Upper abdomen: Small hiatal hernia.  Cholelithiasis. Musculoskeletal: No aggressive appearing focal osseous lesions. Mild thoracic spondylosis. IMPRESSION: 1. Widespread moderate bronchiectasis predominantly in the upper lobes, not substantially changed. Scattered regions of mucoid impaction at the areas of bronchiectasis. 2. Several previously visualized bilateral pulmonary nodules on 2016 chest CT study are stable and considered benign. 3. A few new indeterminate scattered pulmonary nodules in the posterior apical right upper lobe, largest 0.6 cm, most likely foci of mucoid impaction. Suggest attention on follow-up noncontrast chest CT in 6 months. 4. Three-vessel coronary atherosclerosis. 5. Small hiatal hernia. 6. Cholelithiasis. 7. Aortic Atherosclerosis (ICD10-I70.0). Electronically Signed   By: Ilona Sorrel M.D.   On: 02/06/2021 10:03     Assessment & Plan:   Bronchiectasis without acute exacerbation ([place]) - Felt to have postinfectious bronchiectasis. CT chest in June 2022 showed widespread moderate bronchiectasis in upper lobes which appears unchanged. He has a chronic dry cough with is worse in the morning. Cough is rarely productive. He has been taking mucinex 1,200mg  daily, not consistently using flutter valve as he does not feel it to be effective. Recommend adding hypertonic saline nebulizer 1-2 times a day as needed to loose phlegm.    Recommendations: Continue Mucinex 600-1200mg  twice daily to loose mucus (2400mg  max dose a day) Start hypertonic saline  nebulizer- use 1-2 times a day to help with cough  Use flutter valve three times a day Notify our office if you develop increased or purulent mucus production   Multiple pulmonary nodules - CT chest 01/2021 showed bilateral pulmonary nodules unchanged from 2016, considered benign. New indeterminate pulmonary nodules posterior right upper lobe, largest measuring 0.6cm likely mucoid impaction. Needs repeat CT chest in 6 months.   Coronary atherosclerosis - Referring to cardiology for evaluation    Martyn Ehrich, NP 02/26/2021

## 2021-02-26 NOTE — Assessment & Plan Note (Addendum)
-   Felt to have postinfectious bronchiectasis. CT chest in June 2022 showed widespread moderate bronchiectasis in upper lobes which appears unchanged. He has a chronic dry cough with is worse in the morning. Cough is rarely productive. He has been taking mucinex 1,200mg  daily, not consistently using flutter valve as he does not feel it to be effective. Recommend adding hypertonic saline nebulizer 1-2 times a day as needed to loose phlegm.   Recommendations: Continue Mucinex 600-1200mg  twice daily to loose mucus (2400mg  max dose a day) Start hypertonic saline nebulizer- use 1-2 times a day to help with cough  Use flutter valve three times a day Notify our office if you develop increased or purulent mucus production

## 2021-02-26 NOTE — Assessment & Plan Note (Signed)
-   CT chest 01/2021 showed bilateral pulmonary nodules unchanged from 2016, considered benign. New indeterminate pulmonary nodules posterior right upper lobe, largest measuring 0.6cm likely mucoid impaction. Needs repeat CT chest in 6 months.

## 2021-02-26 NOTE — Assessment & Plan Note (Addendum)
-   Referring to cardiology for evaluation

## 2021-02-26 NOTE — Patient Instructions (Addendum)
CT chest on 02/06/2021 showed widespread moderate bronchiectasis, not substantially changed.  Previously noted bilateral pulmonary nodules from 2016 are stable and considered benign. New indeterminant pulmonary nodules in the posterior right upper lobe, largest measuring 0.6 cm, most likely represent mucoid impaction.  Suggest follow-up with noncontrast CT chest in 6 months. Incidental findings three vessel coronary atherosclerosis, aortic atherosclerosis, small hiatal hernia and cholelithiasis.   Recommendations: Continue Mucinex 600-1200mg  twice daily to loose mucus (2400mg  max dose a day) Start hypertonic saline nebulizer- use 1-2 times a day to help with cough  Use flutter valve three times a day Follow-up with PCP regarding incidental findings Notify our office if you develop increased or purulent mucus production   Orders: CT chest wo contrast in 6 motnhs re: bronchiectasis/pulmonary nodules <1cm DME referral nebulizer machine  Referral: Cardiology re: dyspnea/ three vessel atherosclerosis (Dr. Ned Grace)  Follow-up: 6 months with Dr. Valeta Harms after CT scan

## 2021-02-26 NOTE — Progress Notes (Signed)
PCCM:  Thanks for seeing him Garner Nash, DO [facility] 02/26/2021 4:30 PM

## 2021-02-27 ENCOUNTER — Telehealth: Payer: Self-pay | Admitting: Pulmonary Disease

## 2021-02-27 NOTE — Telephone Encounter (Signed)
Call returned to pharmacy, confirmed DOB. Requesting clarification of Hypertonic saline solution order. Missing information provided.   Nothing further needed at this time.

## 2021-03-04 ENCOUNTER — Telehealth: Payer: Self-pay | Admitting: Primary Care

## 2021-03-04 NOTE — Telephone Encounter (Signed)
Called and spoke with patient who states that he was told that when he picked up RX for hypertonic neb solution he was be instructed on how to use it but states that today the medication was delivered to his house. We went over instructions. He expressed understanding. Nothing further needed at this time.

## 2021-03-13 ENCOUNTER — Ambulatory Visit: Payer: Medicare Other | Admitting: Internal Medicine

## 2021-03-27 DIAGNOSIS — Z20822 Contact with and (suspected) exposure to covid-19: Secondary | ICD-10-CM | POA: Diagnosis not present

## 2021-03-27 DIAGNOSIS — R5383 Other fatigue: Secondary | ICD-10-CM | POA: Diagnosis not present

## 2021-03-27 DIAGNOSIS — R0981 Nasal congestion: Secondary | ICD-10-CM | POA: Diagnosis not present

## 2021-04-28 ENCOUNTER — Telehealth: Payer: Self-pay | Admitting: Pulmonary Disease

## 2021-04-28 DIAGNOSIS — L570 Actinic keratosis: Secondary | ICD-10-CM | POA: Diagnosis not present

## 2021-04-28 DIAGNOSIS — L821 Other seborrheic keratosis: Secondary | ICD-10-CM | POA: Diagnosis not present

## 2021-04-28 DIAGNOSIS — D1801 Hemangioma of skin and subcutaneous tissue: Secondary | ICD-10-CM | POA: Diagnosis not present

## 2021-04-28 DIAGNOSIS — D692 Other nonthrombocytopenic purpura: Secondary | ICD-10-CM | POA: Diagnosis not present

## 2021-04-28 DIAGNOSIS — L111 Transient acantholytic dermatosis [Grover]: Secondary | ICD-10-CM | POA: Diagnosis not present

## 2021-04-28 DIAGNOSIS — L812 Freckles: Secondary | ICD-10-CM | POA: Diagnosis not present

## 2021-04-28 DIAGNOSIS — D2261 Melanocytic nevi of right upper limb, including shoulder: Secondary | ICD-10-CM | POA: Diagnosis not present

## 2021-04-28 DIAGNOSIS — Z85828 Personal history of other malignant neoplasm of skin: Secondary | ICD-10-CM | POA: Diagnosis not present

## 2021-04-28 NOTE — Telephone Encounter (Signed)
Called and spoke with patient's wife per DPR to let her know that I am going to send message to Dr. Valeta Harms to get his recommendations about his nebulizer medications and we would call him back.   Dr. Valeta Harms please advise  Pt has questions regarding the medication that goes ito his nebulizer. Sodium Chloride was prescribed, but family doctor also prescribed ibrutropium. Want's to know which one he needs to be using. Please advise.

## 2021-04-29 NOTE — Telephone Encounter (Signed)
Called and spoke with patient to let him know of Dr. Fabio Bering recs with nebulizer medications. He repeated it back to me and verbalized understanding. Nothing further needed at this time.

## 2021-05-19 NOTE — Progress Notes (Signed)
Referring- Geraldo Pitter NP Reason for referral-coronary artery disease  HPI: 87 year old male for evaluation of coronary artery disease at request of Geraldo Pitter, NP.  Carotid Dopplers December 2020 showed no stenosis.  Chest CT June 2022 showed bronchiectasis, pulmonary nodules, three-vessel coronary atherosclerosis and aortic atherosclerosis. Patient has dyspnea with more vigorous activities but not routine activities.  He attributes this to his lung disease.  There is no orthopnea, PND, pedal edema, chest pain or syncope.  Cardiology now asked to evaluate.  Current Outpatient Medications  Medication Sig Dispense Refill   Cholecalciferol 1000 UNITS tablet Take 2,000 Units by mouth daily.     Dapsone 7.5 % GEL Apply 1 application topically daily. 90 g 1   fluocinonide cream (LIDEX) 4.66 % Apply 1 application topically daily.     Glucosamine-Chondroit-Vit C-Mn (GLUCOSAMINE CHONDROITIN COMPLX) CAPS Take 1 capsule by mouth daily.     guaiFENesin (MUCINEX) 600 MG 12 hr tablet Take 2 tablets (1,200 mg total) by mouth 2 (two) times daily. (Patient taking differently: Take 1,200 mg by mouth daily.)     ipratropium-albuterol (DUONEB) 0.5-2.5 (3) MG/3ML SOLN Take 3 mLs by nebulization every 6 (six) hours as needed. 120 mL 3   loratadine (CLARITIN) 10 MG tablet Take 10 mg by mouth daily.     Multiple Vitamin (MULTIVITAMIN) tablet Take 1 tablet by mouth daily.     mupirocin ointment (BACTROBAN) 2 %      niacin 500 MG CR capsule Take 500 mg by mouth at bedtime.     Saw Palmetto POWD Take by mouth daily.     sodium chloride HYPERTONIC 3 % nebulizer solution Take by nebulization as needed for other. Use this 2 times a day for cough. 750 mL 12   terazosin (HYTRIN) 10 MG capsule TAKE 1 CAPSULE BY MOUTH EVERY NIGHT AT BEDTIME 90 capsule 3   triamcinolone ointment (KENALOG) 0.1 % APPLY EXTERNALLY TO THE AFFECTED AREA THREE TIMES DAILY 80 g 3   vitamin C (ASCORBIC ACID) 500 MG tablet Take 500 mg by  mouth daily.     No current facility-administered medications for this visit.    Allergies  Allergen Reactions   Cordran [Flurandrenolide] Rash    rash   Lisinopril Cough    REACTION: cough     Past Medical History:  Diagnosis Date   BPH (benign prostatic hypertrophy)    Dr. Risa Grill   CAD (coronary artery disease)    HA (headache)    Traction- Dr. Jannifer Franklin- had MRI.MRA   HTN (hypertension)    Hyperlipidemia     Past Surgical History:  Procedure Laterality Date   EXCISIONAL HEMORRHOIDECTOMY     LUNG REMOVAL, PARTIAL Right     Social History   Socioeconomic History   Marital status: Married    Spouse name: Not on file   Number of children: Not on file   Years of education: Not on file   Highest education level: Not on file  Occupational History   Not on file  Tobacco Use   Smoking status: Former    Packs/day: 0.10    Years: 3.00    Pack years: 0.30    Types: Cigarettes    Quit date: 07/25/1952    Years since quitting: 68.8   Smokeless tobacco: Never   Tobacco comments:    used to "smoke but not inhale"  Substance and Sexual Activity   Alcohol use: No   Drug use: No   Sexual activity: Yes  Other Topics  Concern   Not on file  Social History Narrative   Not on file   Social Determinants of Health   Financial Resource Strain: Not on file  Food Insecurity: Not on file  Transportation Needs: Not on file  Physical Activity: Not on file  Stress: Not on file  Social Connections: Not on file  Intimate Partner Violence: Not on file    Family History  Problem Relation Age of Onset   Heart disease Father     ROS: no fevers or chills, productive cough, hemoptysis, dysphasia, odynophagia, melena, hematochezia, dysuria, hematuria, rash, seizure activity, orthopnea, PND, pedal edema, claudication. Remaining systems are negative.  Physical Exam:   Blood pressure (!) 146/74, pulse 70, height 5\' 10"  (1.778 m), weight 199 lb 12.8 oz (90.6 kg), SpO2 98  %.  General:  Well developed/well nourished in NAD Skin warm/dry Patient not depressed No peripheral clubbing Back-normal HEENT-normal/normal eyelids Neck supple/normal carotid upstroke bilaterally; no bruits; no JVD; no thyromegaly chest - CTA/ normal expansion CV - RRR/normal S1 and S2; no rubs or gallops;  PMI nondisplaced; 2/6 systolic murmur left sternal border.  S2 is not diminished. Abdomen -NT/ND, no HSM, no mass, + bowel sounds, no bruit 2+ femoral pulses, no bruits Ext-no edema, chords, 2+ DP Neuro-grossly nonfocal  ECG -normal sinus rhythm at a rate of 70, left anterior fascicular block, right bundle branch block.  Personally reviewed  A/P  1 coronary artery disease-based on CT scan demonstrating coronary calcification.  He is not having symptoms including no significant chest pain or worsening dyspnea on exertion (he has some dyspnea related to lung disease).  We will treat medically.  Add aspirin 81 mg daily.  Add statin.  2 hypertension-blood pressure is borderline.  He will follow this at home and we will add additional medications as needed.  3 hyperlipidemia-given coronary calcification noted on CT scan I will discontinue niacin and instead treat with Crestor 20 mg daily.  Check lipids and liver in 8 weeks.  4 murmur-possibly mild aortic stenosis.  We will arrange an echocardiogram to further assess.  Kirk Ruths, MD

## 2021-05-26 DIAGNOSIS — Z961 Presence of intraocular lens: Secondary | ICD-10-CM | POA: Diagnosis not present

## 2021-05-26 DIAGNOSIS — H04123 Dry eye syndrome of bilateral lacrimal glands: Secondary | ICD-10-CM | POA: Diagnosis not present

## 2021-05-26 DIAGNOSIS — H01001 Unspecified blepharitis right upper eyelid: Secondary | ICD-10-CM | POA: Diagnosis not present

## 2021-05-26 DIAGNOSIS — H52203 Unspecified astigmatism, bilateral: Secondary | ICD-10-CM | POA: Diagnosis not present

## 2021-05-28 ENCOUNTER — Other Ambulatory Visit: Payer: Self-pay

## 2021-05-28 ENCOUNTER — Ambulatory Visit: Payer: Medicare Other | Admitting: Cardiology

## 2021-05-28 ENCOUNTER — Encounter: Payer: Self-pay | Admitting: Cardiology

## 2021-05-28 VITALS — BP 146/74 | HR 70 | Ht 70.0 in | Wt 199.8 lb

## 2021-05-28 DIAGNOSIS — R011 Cardiac murmur, unspecified: Secondary | ICD-10-CM

## 2021-05-28 DIAGNOSIS — E7800 Pure hypercholesterolemia, unspecified: Secondary | ICD-10-CM | POA: Diagnosis not present

## 2021-05-28 DIAGNOSIS — I10 Essential (primary) hypertension: Secondary | ICD-10-CM | POA: Diagnosis not present

## 2021-05-28 DIAGNOSIS — I2510 Atherosclerotic heart disease of native coronary artery without angina pectoris: Secondary | ICD-10-CM

## 2021-05-28 MED ORDER — ASPIRIN EC 81 MG PO TBEC: 81.0000 mg | DELAYED_RELEASE_TABLET | Freq: Every day | ORAL | 3 refills | Status: DC

## 2021-05-28 MED ORDER — ROSUVASTATIN CALCIUM 20 MG PO TABS: 20.0000 mg | ORAL_TABLET | Freq: Every day | ORAL | 3 refills | Status: DC

## 2021-05-28 NOTE — Patient Instructions (Addendum)
Medication Instructions:  START ASPIRIN 81 MG ONCE A DAY START ROSUVASTATIN 20 MG ONCE A DAY STOP TAKING NIACIN *If you need a refill on your cardiac medications before your next appointment, please call your pharmacy*   Lab Work: 8 WEEKS FASTING If you have labs (blood work) drawn today and your tests are completely normal, you will receive your results only by: MyChart Message (if you have MyChart) OR A paper copy in the mail If you have any lab test that is abnormal or we need to change your treatment, we will call you to review the results.   Testing/Procedures: Your physician has requested that you have an echocardiogram. Echocardiography is a painless test that uses sound waves to create images of your heart. It provides your doctor with information about the size and shape of your heart and how well your heart's chambers and valves are working. This procedure takes approximately one hour. There are no restrictions for this procedure. THIS IS DONE AT [facility]   Follow-Up: At [facility], you and your health needs are our priority.  As part of our continuing mission to provide you with exceptional heart care, we have created designated Provider Care Teams.  These Care Teams include your primary Cardiologist (physician) and Advanced Practice Providers (APPs -  Physician Assistants and Nurse Practitioners) who all work together to provide you with the care you need, when you need it.  We recommend signing up for the patient portal called "MyChart".  Sign up information is provided on this After Visit Summary.  MyChart is used to connect with patients for Virtual Visits (Telemedicine).  Patients are able to view lab/test results, encounter notes, upcoming appointments, etc.  Non-urgent messages can be sent to your provider as well.   To learn more about what you can do with MyChart, go to [email or web address].    Your next appointment:   6 month(s)  The format for  your next appointment:   In Person  Provider:   Kirk Ruths, MD

## 2021-06-05 DIAGNOSIS — M533 Sacrococcygeal disorders, not elsewhere classified: Secondary | ICD-10-CM | POA: Diagnosis not present

## 2021-06-05 DIAGNOSIS — M791 Myalgia, unspecified site: Secondary | ICD-10-CM | POA: Diagnosis not present

## 2021-06-06 ENCOUNTER — Ambulatory Visit (HOSPITAL_COMMUNITY): Payer: Medicare Other | Attending: Cardiology

## 2021-06-06 ENCOUNTER — Other Ambulatory Visit: Payer: Self-pay

## 2021-06-06 DIAGNOSIS — E7800 Pure hypercholesterolemia, unspecified: Secondary | ICD-10-CM | POA: Insufficient documentation

## 2021-06-06 DIAGNOSIS — I2510 Atherosclerotic heart disease of native coronary artery without angina pectoris: Secondary | ICD-10-CM | POA: Insufficient documentation

## 2021-06-06 DIAGNOSIS — R011 Cardiac murmur, unspecified: Secondary | ICD-10-CM | POA: Insufficient documentation

## 2021-06-06 DIAGNOSIS — I10 Essential (primary) hypertension: Secondary | ICD-10-CM

## 2021-06-06 LAB — ECHOCARDIOGRAM COMPLETE
Area-P 1/2: 2.4 cm2
P 1/2 time: 673 msec
S' Lateral: 3.5 cm

## 2021-06-09 ENCOUNTER — Encounter: Payer: Self-pay | Admitting: *Deleted

## 2021-06-12 DIAGNOSIS — M65332 Trigger finger, left middle finger: Secondary | ICD-10-CM | POA: Diagnosis not present

## 2021-06-19 DIAGNOSIS — G5601 Carpal tunnel syndrome, right upper limb: Secondary | ICD-10-CM | POA: Diagnosis not present

## 2021-07-03 DIAGNOSIS — G5601 Carpal tunnel syndrome, right upper limb: Secondary | ICD-10-CM | POA: Insufficient documentation

## 2021-07-07 ENCOUNTER — Other Ambulatory Visit: Payer: Self-pay | Admitting: Internal Medicine

## 2021-07-14 ENCOUNTER — Telehealth: Payer: Self-pay | Admitting: Internal Medicine

## 2021-07-14 NOTE — Telephone Encounter (Signed)
LVM for pt to rtn my call to schedule AWV with [facility]. Please schedule appt if pt calls the office.  

## 2021-07-15 DIAGNOSIS — E7800 Pure hypercholesterolemia, unspecified: Secondary | ICD-10-CM | POA: Diagnosis not present

## 2021-07-15 DIAGNOSIS — I2510 Atherosclerotic heart disease of native coronary artery without angina pectoris: Secondary | ICD-10-CM | POA: Diagnosis not present

## 2021-07-15 DIAGNOSIS — I10 Essential (primary) hypertension: Secondary | ICD-10-CM | POA: Diagnosis not present

## 2021-07-15 LAB — HEPATIC FUNCTION PANEL
ALT: 16 IU/L (ref 0–44)
AST: 18 IU/L (ref 0–40)
Albumin: 4.3 g/dL (ref 3.6–4.6)
Alkaline Phosphatase: 103 IU/L (ref 44–121)
Bilirubin Total: 0.5 mg/dL (ref 0.0–1.2)
Bilirubin, Direct: 0.17 mg/dL (ref 0.00–0.40)
Total Protein: 6.6 g/dL (ref 6.0–8.5)

## 2021-07-15 LAB — LIPID PANEL
Chol/HDL Ratio: 3.4 ratio (ref 0.0–5.0)
Cholesterol, Total: 142 mg/dL (ref 100–199)
HDL: 42 mg/dL (ref >39)
LDL Chol Calc (NIH): 74 mg/dL (ref 0–99)
Triglycerides: 147 mg/dL (ref 0–149)
VLDL Cholesterol Cal: 26 mg/dL (ref 5–40)

## 2021-07-17 ENCOUNTER — Encounter: Payer: Self-pay | Admitting: *Deleted

## 2021-08-05 ENCOUNTER — Other Ambulatory Visit: Payer: Self-pay | Admitting: Internal Medicine

## 2021-08-08 ENCOUNTER — Ambulatory Visit (INDEPENDENT_AMBULATORY_CARE_PROVIDER_SITE_OTHER)
Admission: RE | Admit: 2021-08-08 | Discharge: 2021-08-08 | Disposition: A | Discharge status: Home / Self Care | Payer: Medicare Other | Source: Ambulatory Visit | Attending: Primary Care | Admitting: Primary Care

## 2021-08-08 ENCOUNTER — Other Ambulatory Visit: Payer: Self-pay

## 2021-08-08 DIAGNOSIS — R911 Solitary pulmonary nodule: Secondary | ICD-10-CM

## 2021-08-08 DIAGNOSIS — I700 Atherosclerosis of aorta: Secondary | ICD-10-CM | POA: Diagnosis not present

## 2021-08-08 DIAGNOSIS — J479 Bronchiectasis, uncomplicated: Secondary | ICD-10-CM | POA: Diagnosis not present

## 2021-08-25 ENCOUNTER — Telehealth: Payer: Self-pay | Admitting: Pulmonary Disease

## 2021-08-26 NOTE — Telephone Encounter (Signed)
Noted.  EW please advise. Requesting results of CT. Thanks

## 2021-08-26 NOTE — Telephone Encounter (Signed)
Sent result note to Emily's box

## 2021-08-26 NOTE — Progress Notes (Signed)
CT chest showed no new or acute findings; upper lobe predominant bronchiectasis with scattered peribronchovascular nodularity is similar to the prior. Recommend discussing in further detail at follow-up on 1/20 with Dr. Valeta Harms.   Incidental findings three vessel CAD, ectasia ascending thoracic aorta - needs annual imaging with PCP.

## 2021-08-27 NOTE — Telephone Encounter (Signed)
Martyn Ehrich, NP  Clayden Withem, Waldemar Dickens, CMA CT chest showed no new or acute findings; upper lobe predominant bronchiectasis with scattered  peribronchovascular nodularity is similar to the prior. Recommend discussing in further detail at follow-up on 1/20 with Dr. Valeta Harms.   Incidental findings three vessel CAD, ectasia ascending thoracic aorta - needs annual imaging with PCP.     Called and spoke with pt letting him know the results of CT and he verbalized understanding. Nothing further needed.

## 2021-09-03 ENCOUNTER — Telehealth: Payer: Self-pay | Admitting: Pulmonary Disease

## 2021-09-03 MED ORDER — SODIUM CHLORIDE 3 % IN NEBU: INHALATION_SOLUTION | RESPIRATORY_TRACT | 5 refills | Status: DC | PRN

## 2021-09-03 NOTE — Telephone Encounter (Signed)
Called and spoke with patient. He stated he is in need of his sodium chloride. His normal pharmacy did not have it in [place] but they advised him that the [facility] on [address] did. I have sent a RX to [facility].   He is aware to call us back if he is not able to get it from [facility]. I did tell him that we can try [facility] next.   Nothing further needed at time of call.

## 2021-09-05 ENCOUNTER — Encounter: Payer: Self-pay | Admitting: Pulmonary Disease

## 2021-09-05 ENCOUNTER — Ambulatory Visit: Payer: Medicare Other | Admitting: Pulmonary Disease

## 2021-09-05 ENCOUNTER — Other Ambulatory Visit: Payer: Self-pay

## 2021-09-05 VITALS — BP 152/80 | HR 78 | Temp 97.5°F | Ht 70.0 in | Wt 199.6 lb

## 2021-09-05 DIAGNOSIS — J479 Bronchiectasis, uncomplicated: Secondary | ICD-10-CM | POA: Diagnosis not present

## 2021-09-05 DIAGNOSIS — Z9889 Other specified postprocedural states: Secondary | ICD-10-CM | POA: Diagnosis not present

## 2021-09-05 DIAGNOSIS — R918 Other nonspecific abnormal finding of lung field: Secondary | ICD-10-CM

## 2021-09-05 NOTE — Patient Instructions (Signed)
Thank you for visiting Dr. Valeta Harms at [facility]. Today we recommend the following:  Use your nebulizer machine twice per day followed by the flutter valve.   Return in about 1 year (around 09/05/2022) for with APP or Dr. Valeta Harms.    Please do your part to reduce the spread of COVID-19.

## 2021-09-05 NOTE — Progress Notes (Signed)
Synopsis: Referred in June 2022 for lung nodule by Plotnikov, Evie Lacks, MD  Subjective:   PATIENT ID: Curtis Jacobs GENDER: male DOB: 1932/08/26, MRN: [id number]  Chief Complaint  Patient presents with   Follow-up    Patient is here for a follow up. Says he can't get his medicine for the nebulizer.     88 year old gentleman, past medical history of BPH, hypertension, hyperlipidemia.  Patient has been been followed for many years in the past by Dr. Lake Bells.  Last CT scan of the chest was in 2016 to follow a left upper lobe pulmonary nodule.  He had 1 nodule resected in the right upper lobe many years ago that was benign.  From a respiratory standpoint he has been doing well he uses his nebulizer rarely.  He tries to get up every single day with a airway clearance routine.  Lots of coughing and clearing of his chest.  He starts with a dry cough in the morning usually becomes more productive for about 15 to 20 minutes of airway clearance.  Then he is fine for the rest of the day.  He does take guaifenesin daily.  He has been on this also for several years.  Of note he states he had pneumonia that almost killed him many years ago was in the ICU.  Felt to have bronchiectasis and scarring related to his previous illness.  Occupation: Patient is a retired Dealer, Chief Financial Officer.  OV 09/05/2021: Here today for follow-up.Followed in pulmonary clinic with associated bilateral pulmonary nodules and bronchiectasis. Last year to rounds of abx. Nothing recently. Unable to get hypertonic saline from pharmacy. Not using his nebs regularly.  Patient has regular episodes in the morning that requires persistent coughing to get his congestion cleared.   Past Medical History:  Diagnosis Date   BPH (benign prostatic hypertrophy)    Dr. Risa Grill   CAD (coronary artery disease)    HA (headache)    Traction- Dr. Jannifer Franklin- had MRI.MRA   HTN (hypertension)    Hyperlipidemia      Family History  Problem Relation Age of  Onset   Heart disease Father      Past Surgical History:  Procedure Laterality Date   EXCISIONAL HEMORRHOIDECTOMY     LUNG REMOVAL, PARTIAL Right     Social History   Socioeconomic History   Marital status: Married    Spouse name: Not on file   Number of children: Not on file   Years of education: Not on file   Highest education level: Not on file  Occupational History   Not on file  Tobacco Use   Smoking status: Former    Packs/day: 0.10    Years: 3.00    Pack years: 0.30    Types: Cigarettes    Quit date: 07/25/1952    Years since quitting: 69.1   Smokeless tobacco: Never   Tobacco comments:    used to "smoke but not inhale"  Substance and Sexual Activity   Alcohol use: No   Drug use: No   Sexual activity: Yes  Other Topics Concern   Not on file  Social History Narrative   Not on file   Social Determinants of Health   Financial Resource Strain: Not on file  Food Insecurity: Not on file  Transportation Needs: Not on file  Physical Activity: Not on file  Stress: Not on file  Social Connections: Not on file  Intimate Partner Violence: Not on file     Allergies  Allergen Reactions   Cordran [Flurandrenolide] Rash    rash   Lisinopril Cough    REACTION: cough     Outpatient Medications Prior to Visit  Medication Sig Dispense Refill   aspirin EC 81 MG tablet Take 1 tablet (81 mg total) by mouth daily. Swallow whole. 90 tablet 3   Cholecalciferol 1000 UNITS tablet Take 2,000 Units by mouth daily.     Dapsone 7.5 % GEL Apply 1 application topically daily. 90 g 1   fluocinonide cream (LIDEX) 5.27 % Apply 1 application topically daily.     Glucosamine-Chondroit-Vit C-Mn (GLUCOSAMINE CHONDROITIN COMPLX) CAPS Take 1 capsule by mouth daily.     guaiFENesin (MUCINEX) 600 MG 12 hr tablet Take 2 tablets (1,200 mg total) by mouth 2 (two) times daily. (Patient taking differently: Take 1,200 mg by mouth daily.)     ipratropium-albuterol (DUONEB) 0.5-2.5 (3) MG/3ML  SOLN Take 3 mLs by nebulization every 6 (six) hours as needed. 120 mL 3   loratadine (CLARITIN) 10 MG tablet Take 10 mg by mouth daily.     Multiple Vitamin (MULTIVITAMIN) tablet Take 1 tablet by mouth daily.     mupirocin ointment (BACTROBAN) 2 %      Saw Palmetto POWD Take by mouth daily.     sodium chloride HYPERTONIC 3 % nebulizer solution Take by nebulization as needed for other. Use this 2 times a day for cough. 750 mL 5   terazosin (HYTRIN) 10 MG capsule TAKE 1 CAPSULE BY MOUTH AT BEDTIME 30 capsule 11   triamcinolone ointment (KENALOG) 0.1 % APPLY EXTERNALLY TO THE AFFECTED AREA THREE TIMES DAILY 80 g 3   vitamin C (ASCORBIC ACID) 500 MG tablet Take 500 mg by mouth daily.     rosuvastatin (CRESTOR) 20 MG tablet Take 1 tablet (20 mg total) by mouth daily. 90 tablet 3   No facility-administered medications prior to visit.    Review of Systems  Constitutional:  Negative for chills, fever, malaise/fatigue and weight loss.  HENT:  Negative for hearing loss, sore throat and tinnitus.   Eyes:  Negative for blurred vision and double vision.  Respiratory:  Positive for cough, sputum production and shortness of breath. Negative for hemoptysis, wheezing and stridor.   Cardiovascular:  Negative for chest pain, palpitations, orthopnea, leg swelling and PND.  Gastrointestinal:  Negative for abdominal pain, constipation, diarrhea, heartburn, nausea and vomiting.  Genitourinary:  Negative for dysuria, hematuria and urgency.  Musculoskeletal:  Negative for joint pain and myalgias.  Skin:  Negative for itching and rash.  Neurological:  Negative for dizziness, tingling, weakness and headaches.  Endo/Heme/Allergies:  Negative for environmental allergies. Does not bruise/bleed easily.  Psychiatric/Behavioral:  Negative for depression. The patient is not nervous/anxious and does not have insomnia.   All other systems reviewed and are negative.   Objective:  Physical Exam Vitals reviewed.   Constitutional:      General: He is not in acute distress.    Appearance: He is well-developed.  HENT:     Head: Normocephalic and atraumatic.  Eyes:     General: No scleral icterus.    Conjunctiva/sclera: Conjunctivae normal.     Pupils: Pupils are equal, round, and reactive to light.  Neck:     Vascular: No JVD.     Trachea: No tracheal deviation.  Cardiovascular:     Rate and Rhythm: Normal rate and regular rhythm.     Heart sounds: Normal heart sounds. No murmur heard. Pulmonary:     Effort: Pulmonary  effort is normal. No tachypnea, accessory muscle usage or respiratory distress.     Breath sounds: No stridor. Rhonchi present. No wheezing or rales.  Abdominal:     General: Bowel sounds are normal. There is no distension.     Palpations: Abdomen is soft.     Tenderness: There is no abdominal tenderness.  Musculoskeletal:        General: No tenderness.     Cervical back: Neck supple.  Lymphadenopathy:     Cervical: No cervical adenopathy.  Skin:    General: Skin is warm and dry.     Capillary Refill: Capillary refill takes less than 2 seconds.     Findings: No rash.  Neurological:     Mental Status: He is alert and oriented to person, place, and time.  Psychiatric:        Behavior: Behavior normal.     Vitals:   09/05/21 1121  BP: (!) 152/80  Pulse: 78  Temp: (!) 97.5 F (36.4 C)  TempSrc: Oral  SpO2: 95%  Weight: 199 lb 9.6 oz (90.5 kg)  Height: 5\' 10"  (1.778 m)   95% on RA BMI Readings from Last 3 Encounters:  09/05/21 28.64 kg/m  05/28/21 28.67 kg/m  02/26/21 28.27 kg/m   Wt Readings from Last 3 Encounters:  09/05/21 199 lb 9.6 oz (90.5 kg)  05/28/21 199 lb 12.8 oz (90.6 kg)  02/26/21 197 lb (89.4 kg)     CBC    Component Value Date/Time   WBC 5.3 02/02/2019 1034   RBC 4.55 02/02/2019 1034   HGB 14.4 02/02/2019 1034   HCT 42.6 02/02/2019 1034   PLT 177.0 02/02/2019 1034   MCV 93.7 02/02/2019 1034   MCHC 33.8 02/02/2019 1034   RDW 13.3  02/02/2019 1034   LYMPHSABS 1.5 02/02/2019 1034   MONOABS 0.4 02/02/2019 1034   EOSABS 0.1 02/02/2019 1034   BASOSABS 0.0 02/02/2019 1034    Chest Imaging: 07/15/2015 CT chest: Bilateral pulmonary nodules, areas of bronchiectasis and scarring. The patient's images have been independently reviewed by me.    Pulmonary Functions Testing Results: No flowsheet data found.  FeNO:   Pathology:   Echocardiogram:   Heart Catheterization:     Assessment & Plan:     ICD-10-CM   1. Bronchiectasis without acute exacerbation ([place])  J47.9     2. Multiple pulmonary nodules  R91.8     3. History of lung surgery  Z98.890        Discussion:  This is a 88 year old gentleman for a long time history of following in clinic secondary to bilateral pulmonary nodules and associated bronchiectasis.  In the past has been admitted to the hospital and even the intensive care unit for pneumonia.  He has a daily regimen and of airway clearance techniques and cough and sputum production.  Plan: Continue guaifenesin Continue DuoNebs. Patient has not been really using any of the hypertonic saline because he been unable to get that at local pharmacy. Has not really been using his DuoNebs either.  So I encouraged him to swap that out to at least use DuoNeb once in the morning once in the evening help with airway clearance. Continue flutter valve use daily. Return to clinic in 1 year or as needed.    Current Outpatient Medications:    aspirin EC 81 MG tablet, Take 1 tablet (81 mg total) by mouth daily. Swallow whole., Disp: 90 tablet, Rfl: 3   Cholecalciferol 1000 UNITS tablet, Take 2,000 Units by  mouth daily., Disp: , Rfl:    Dapsone 7.5 % GEL, Apply 1 application topically daily., Disp: 90 g, Rfl: 1   fluocinonide cream (LIDEX) 1.61 %, Apply 1 application topically daily., Disp: , Rfl:    Glucosamine-Chondroit-Vit C-Mn (GLUCOSAMINE CHONDROITIN COMPLX) CAPS, Take 1 capsule by mouth daily., Disp: ,  Rfl:    guaiFENesin (MUCINEX) 600 MG 12 hr tablet, Take 2 tablets (1,200 mg total) by mouth 2 (two) times daily. (Patient taking differently: Take 1,200 mg by mouth daily.), Disp: , Rfl:    ipratropium-albuterol (DUONEB) 0.5-2.5 (3) MG/3ML SOLN, Take 3 mLs by nebulization every 6 (six) hours as needed., Disp: 120 mL, Rfl: 3   loratadine (CLARITIN) 10 MG tablet, Take 10 mg by mouth daily., Disp: , Rfl:    Multiple Vitamin (MULTIVITAMIN) tablet, Take 1 tablet by mouth daily., Disp: , Rfl:    mupirocin ointment (BACTROBAN) 2 %, , Disp: , Rfl:    Saw Palmetto POWD, Take by mouth daily., Disp: , Rfl:    sodium chloride HYPERTONIC 3 % nebulizer solution, Take by nebulization as needed for other. Use this 2 times a day for cough., Disp: 750 mL, Rfl: 5   terazosin (HYTRIN) 10 MG capsule, TAKE 1 CAPSULE BY MOUTH AT BEDTIME, Disp: 30 capsule, Rfl: 11   triamcinolone ointment (KENALOG) 0.1 %, APPLY EXTERNALLY TO THE AFFECTED AREA THREE TIMES DAILY, Disp: 80 g, Rfl: 3   vitamin C (ASCORBIC ACID) 500 MG tablet, Take 500 mg by mouth daily., Disp: , Rfl:    rosuvastatin (CRESTOR) 20 MG tablet, Take 1 tablet (20 mg total) by mouth daily., Disp: 90 tablet, Rfl: 3    Garner Nash, DO [facility] 09/05/2021 11:29 AM

## 2021-09-08 IMAGING — CT CT CHEST W/O CM
2 of 4 series · 15 of 36 positions shown, 18 images · non-contrast
Comparison: 07/15/2015 chest CT.  08/08/2019 chest radiograph.

CLINICAL DATA: Follow-up pulmonary nodules and bronchiectasis.

EXAM:
CT CHEST WITHOUT CONTRAST
TECHNIQUE: Multidetector CT imaging of the chest was performed following the
standard protocol without IV contrast.

[Series 2: thorax · axial · 0.77mm/px · z∈[+1386,+1674]mm · 12 of 172 slices shown, 15 images]
[im 14/172  mediastinal]
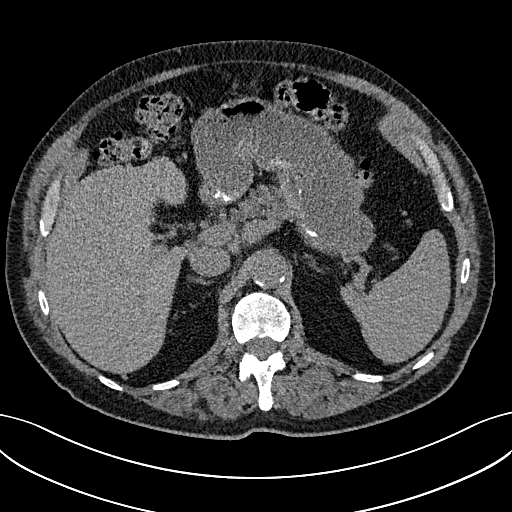
[im 14/172  lung]
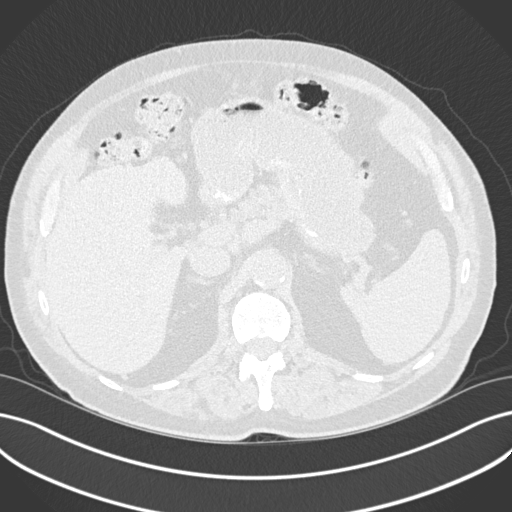
[im 27/172  lung]
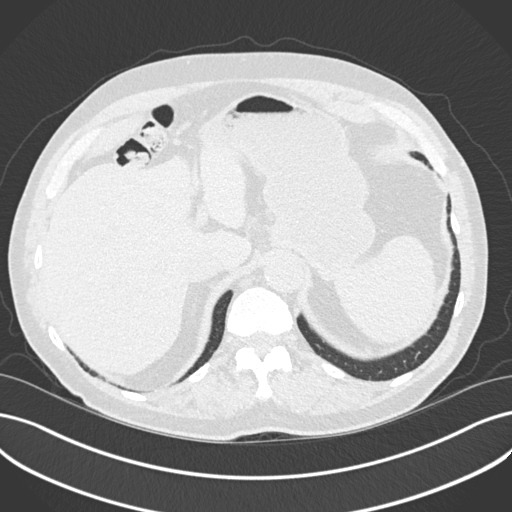
[im 40/172  lung]
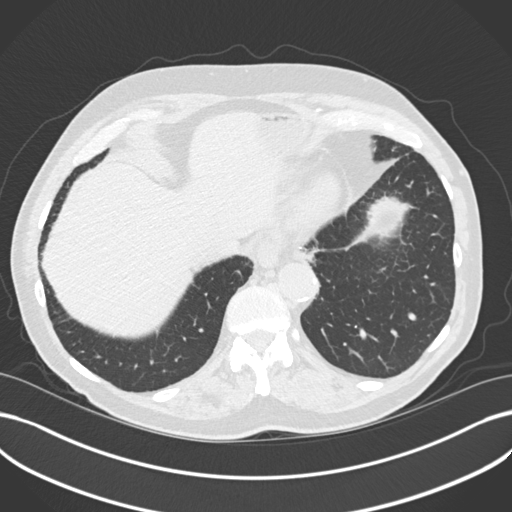
[im 53/172  lung]
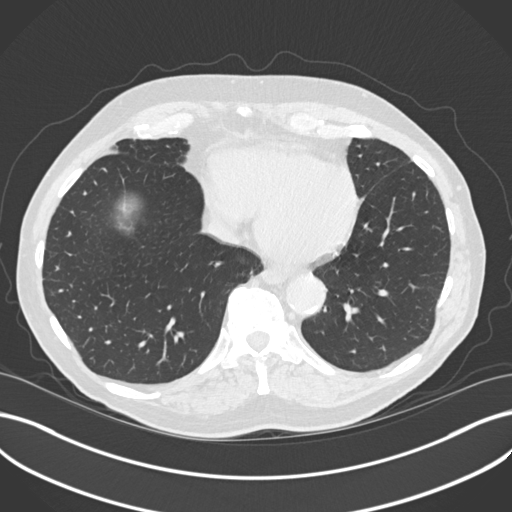
[im 66/172  mediastinal]
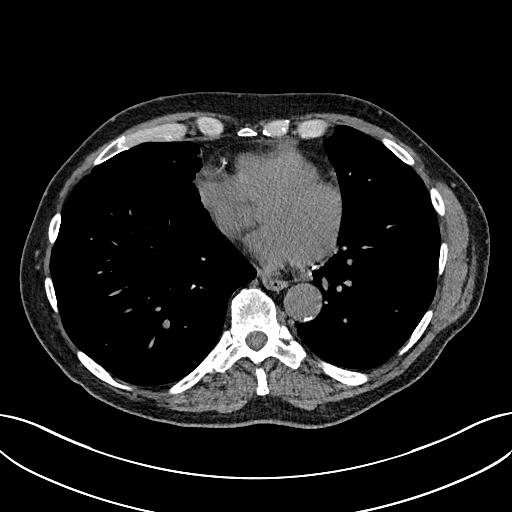
[im 66/172  lung]
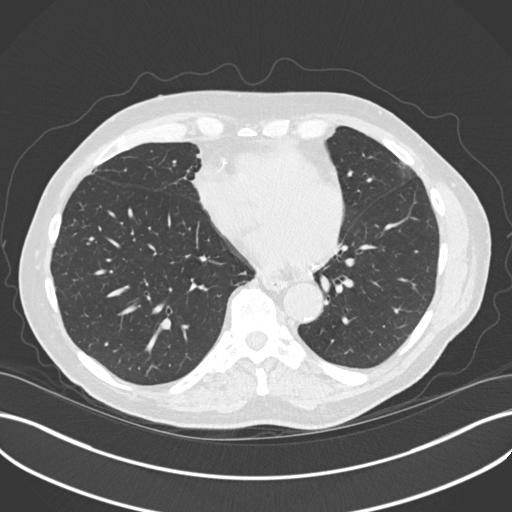
[im 79/172  lung]
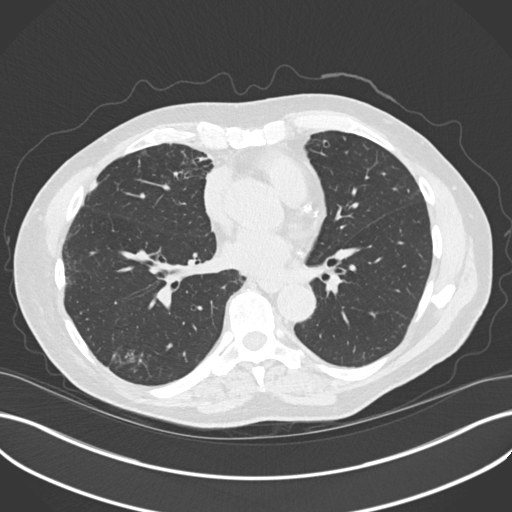
[im 93/172  lung]
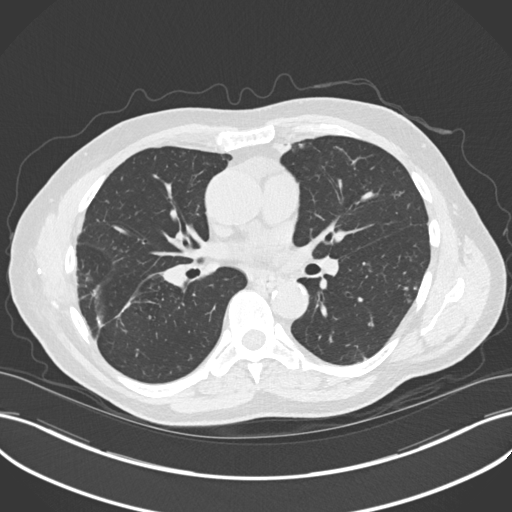
[im 106/172  lung]
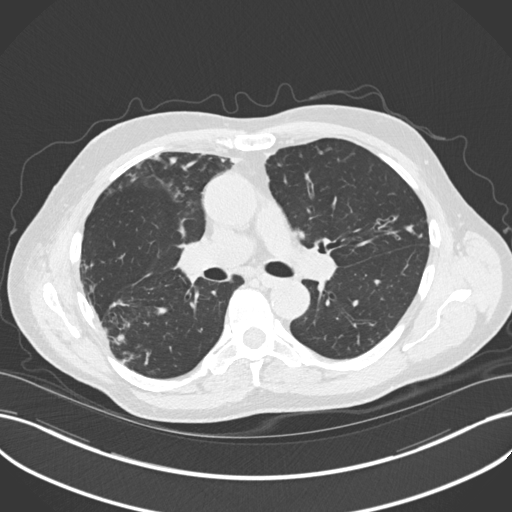
[im 119/172  mediastinal]
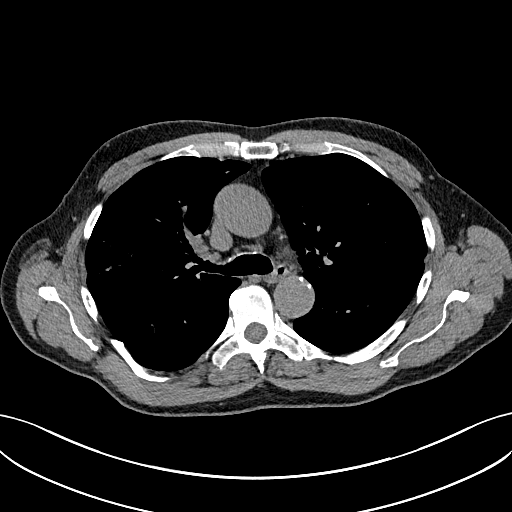
[im 119/172  lung]
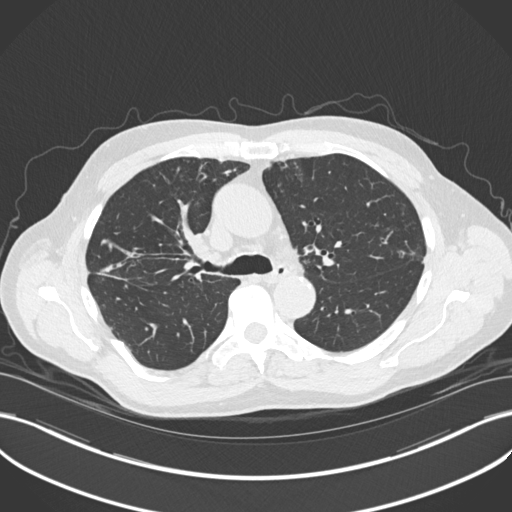
[im 132/172  lung]
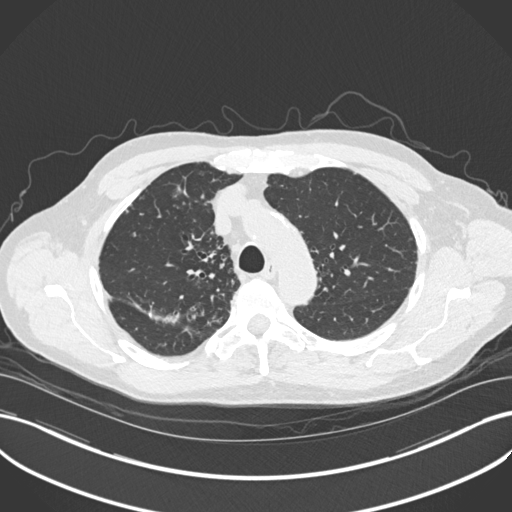
[im 145/172  lung]
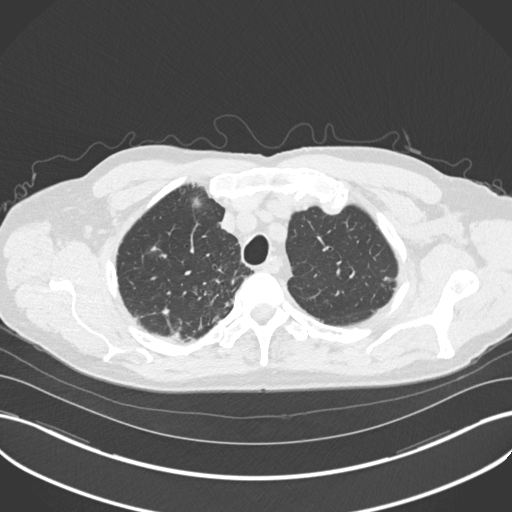
[im 158/172  lung]
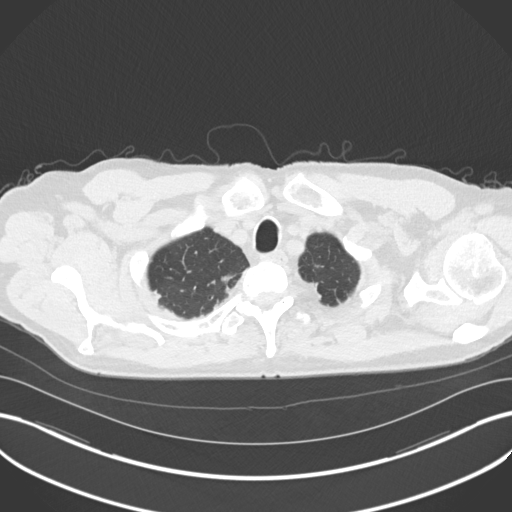

[Series 5: coronal · coronal · 0.67mm/px · 3 of 130 slices shown]
[im 26/130  lung]
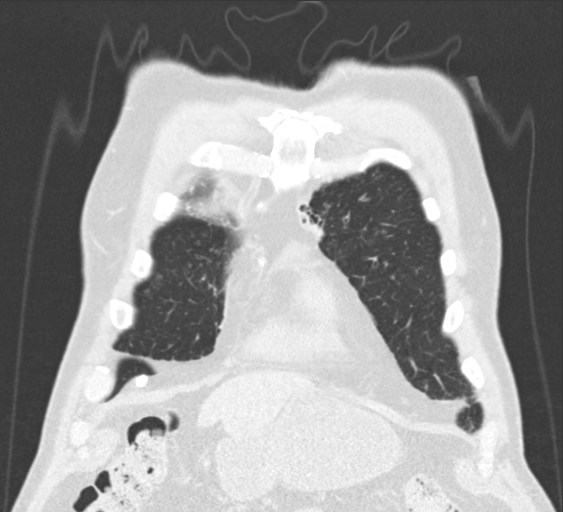
[im 52/130  lung]
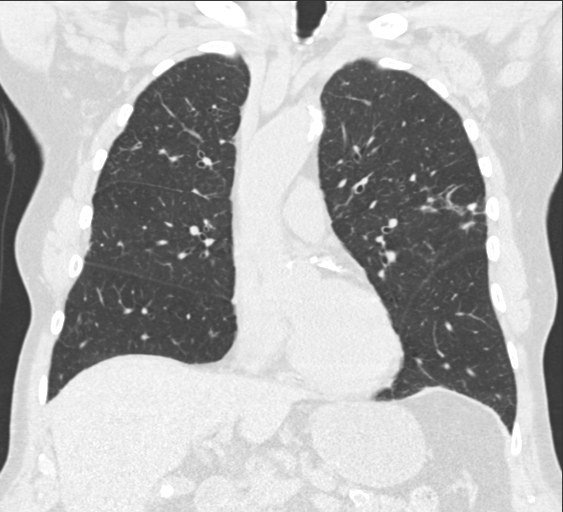
[im 78/130  lung]
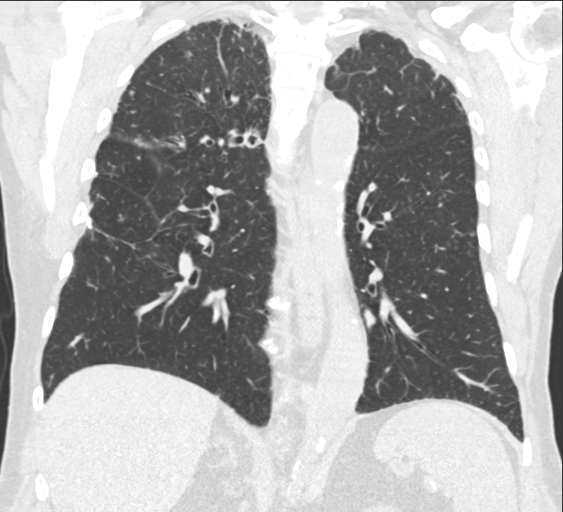

[15 of 36 positions shown; findings below may reference images not displayed]

FINDINGS: Cardiovascular: Normal heart size. No significant pericardial
effusion/thickening. Three-vessel coronary atherosclerosis.
Atherosclerotic thoracic aorta with stable dilated 4.0 cm maximum
diameter ascending thoracic aorta. Normal caliber pulmonary
arteries.

Mediastinum/Nodes: Stable subcentimeter hypodense posterior right
thyroid nodule. Not clinically significant; no follow-up imaging
recommended (ref: [HOSPITAL]. [DATE]): 143-50).
Unremarkable esophagus. No pathologically enlarged axillary,
mediastinal or hilar lymph nodes, noting limited sensitivity for the
detection of hilar adenopathy on this noncontrast study.

Lungs/Pleura: No pneumothorax. No pleural effusion. Widespread
patchy regions of moderate cylindrical and varicoid bronchiectasis
in both lungs predominantly in the upper lobes with relative sparing
of the lower lungs, not substantially changed. Scattered regions of
mucoid impaction at the areas of bronchiectasis. Several previously
visualized scattered solid pulmonary nodules in the upper lobes
bilaterally, largest 1.2 cm in the peripheral left upper lobe
(series 3/image 58), all stable and considered benign. A few new
scattered pulmonary nodules in the posterior apical right upper
lobe, largest 0.6 cm (series 3/image 26), favor foci of mucoid
impaction.

Upper abdomen: Small hiatal hernia.  Cholelithiasis.

Musculoskeletal: No aggressive appearing focal osseous lesions. Mild
thoracic spondylosis.
IMPRESSION: 1. Widespread moderate bronchiectasis predominantly in the upper
lobes, not substantially changed. Scattered regions of mucoid
impaction at the areas of bronchiectasis.
2. Several previously visualized bilateral pulmonary nodules on 7882
chest CT study are stable and considered benign.
3. A few new indeterminate scattered pulmonary nodules in the
posterior apical right upper lobe, largest 0.6 cm, most likely foci
of mucoid impaction. Suggest attention on follow-up noncontrast
chest CT in 6 months.
4. Three-vessel coronary atherosclerosis.
5. Small hiatal hernia.
6. Cholelithiasis.
7. Aortic Atherosclerosis ([id number]).

## 2021-09-11 DIAGNOSIS — M533 Sacrococcygeal disorders, not elsewhere classified: Secondary | ICD-10-CM | POA: Diagnosis not present

## 2021-09-11 DIAGNOSIS — M791 Myalgia, unspecified site: Secondary | ICD-10-CM | POA: Diagnosis not present

## 2021-09-18 DIAGNOSIS — M25512 Pain in left shoulder: Secondary | ICD-10-CM | POA: Diagnosis not present

## 2021-09-18 DIAGNOSIS — M533 Sacrococcygeal disorders, not elsewhere classified: Secondary | ICD-10-CM | POA: Diagnosis not present

## 2021-11-06 DIAGNOSIS — L821 Other seborrheic keratosis: Secondary | ICD-10-CM | POA: Diagnosis not present

## 2021-11-06 DIAGNOSIS — D1801 Hemangioma of skin and subcutaneous tissue: Secondary | ICD-10-CM | POA: Diagnosis not present

## 2021-11-06 DIAGNOSIS — L565 Disseminated superficial actinic porokeratosis (DSAP): Secondary | ICD-10-CM | POA: Diagnosis not present

## 2021-11-06 DIAGNOSIS — Z85828 Personal history of other malignant neoplasm of skin: Secondary | ICD-10-CM | POA: Diagnosis not present

## 2021-11-06 DIAGNOSIS — Z85820 Personal history of malignant melanoma of skin: Secondary | ICD-10-CM | POA: Diagnosis not present

## 2021-11-10 NOTE — Progress Notes (Signed)
? ? ? ? ?HPI: FU CAD. Carotid Dopplers December 2020 showed no stenosis.  Chest CT June 2022 showed bronchiectasis, pulmonary nodules, three-vessel coronary atherosclerosis and aortic atherosclerosis.  Echocardiogram October 2022 showed normal LV function, mild left ventricular enlargement, grade 1 diastolic dysfunction, mild right ventricular enlargement, mild mitral regurgitation, mild aortic insufficiency, dilated ascending aorta at 44 mm and dilated aortic root at 45 mm.  Chest CT December 2022 showed previous right upper lobe resection, bronchiectasis and ectatic ascending aorta at 4 cm.  Since last seen he has dyspnea on exertion that he attributes to his bronchiectasis.  There is no orthopnea, PND, increased pedal edema, chest pain or syncope. ? ?Current Outpatient Medications  ?Medication Sig Dispense Refill  ? Cholecalciferol 1000 UNITS tablet Take 2,000 Units by mouth daily.    ? Dapsone 7.5 % GEL Apply 1 application topically daily. 90 g 1  ? fluocinonide cream (LIDEX) 0.62 % Apply 1 application topically daily.    ? Glucosamine-Chondroit-Vit C-Mn (GLUCOSAMINE CHONDROITIN COMPLX) CAPS Take 1 capsule by mouth daily.    ? guaiFENesin (MUCINEX) 600 MG 12 hr tablet Take 2 tablets (1,200 mg total) by mouth 2 (two) times daily. (Patient taking differently: Take 1,200 mg by mouth daily.)    ? ipratropium-albuterol (DUONEB) 0.5-2.5 (3) MG/3ML SOLN Take 3 mLs by nebulization every 6 (six) hours as needed. 120 mL 3  ? loratadine (CLARITIN) 10 MG tablet Take 10 mg by mouth daily.    ? Multiple Vitamin (MULTIVITAMIN) tablet Take 1 tablet by mouth daily.    ? mupirocin ointment (BACTROBAN) 2 %     ? Saw Palmetto POWD Take by mouth daily.    ? sodium chloride HYPERTONIC 3 % nebulizer solution Take by nebulization as needed for other. Use this 2 times a day for cough. 750 mL 5  ? terazosin (HYTRIN) 10 MG capsule TAKE 1 CAPSULE BY MOUTH AT BEDTIME 30 capsule 11  ? triamcinolone ointment (KENALOG) 0.1 % APPLY EXTERNALLY  TO THE AFFECTED AREA THREE TIMES DAILY 80 g 3  ? vitamin C (ASCORBIC ACID) 500 MG tablet Take 500 mg by mouth daily.    ? rosuvastatin (CRESTOR) 20 MG tablet Take 1 tablet (20 mg total) by mouth daily. 90 tablet 3  ? ?No current facility-administered medications for this visit.  ? ? ? ?Past Medical History:  ?Diagnosis Date  ? BPH (benign prostatic hypertrophy)   ? Dr. Risa Grill  ? CAD (coronary artery disease)   ? HA (headache)   ? Traction- Dr. Jannifer Franklin- had MRI.MRA  ? HTN (hypertension)   ? Hyperlipidemia   ? ? ?Past Surgical History:  ?Procedure Laterality Date  ? EXCISIONAL HEMORRHOIDECTOMY    ? LUNG REMOVAL, PARTIAL Right   ? ? ?Social History  ? ?Socioeconomic History  ? Marital status: Married  ?  Spouse name: Not on file  ? Number of children: Not on file  ? Years of education: Not on file  ? Highest education level: Not on file  ?Occupational History  ? Not on file  ?Tobacco Use  ? Smoking status: Former  ?  Packs/day: 0.10  ?  Years: 3.00  ?  Pack years: 0.30  ?  Types: Cigarettes  ?  Quit date: 07/25/1952  ?  Years since quitting: 69.3  ? Smokeless tobacco: Never  ? Tobacco comments:  ?  used to "smoke but not inhale"  ?Substance and Sexual Activity  ? Alcohol use: No  ? Drug use: No  ? Sexual activity:  Yes  ?Other Topics Concern  ? Not on file  ?Social History Narrative  ? Not on file  ? ?Social Determinants of Health  ? ?Financial Resource Strain: Not on file  ?Food Insecurity: Not on file  ?Transportation Needs: Not on file  ?Physical Activity: Not on file  ?Stress: Not on file  ?Social Connections: Not on file  ?Intimate Partner Violence: Not on file  ? ? ?Family History  ?Problem Relation Age of Onset  ? Heart disease Father   ? ? ?ROS: no fevers or chills, productive cough, hemoptysis, dysphasia, odynophagia, melena, hematochezia, dysuria, hematuria, rash, seizure activity, orthopnea, PND, pedal edema, claudication. Remaining systems are negative. ? ?Physical Exam: ?Well-developed well-nourished in no  acute distress.  ?Skin is warm and dry.  ?HEENT is normal.  ?Neck is supple.  ?Chest is clear to auscultation with normal expansion.  ?Cardiovascular exam is regular rate and rhythm.  ?Abdominal exam nontender or distended. No masses palpated. ?Extremities show no edema. ?neuro grossly intact ? ? ?A/P ? ?1 coronary artery disease-based on previous CT scan demonstrating coronary calcification.  Patient denies chest pain.  Continue medical therapy with aspirin and statin. ? ?2 hypertension-patient's blood pressure is controlled.  Continue present medical regimen. ? ?3 hyperlipidemia-continue statin. ? ?Kirk Ruths, MD ? ? ? ?

## 2021-11-18 ENCOUNTER — Ambulatory Visit: Payer: Medicare Other | Admitting: Cardiology

## 2021-11-18 ENCOUNTER — Encounter: Payer: Self-pay | Admitting: Cardiology

## 2021-11-18 VITALS — BP 120/64 | HR 72 | Resp 20 | Ht 70.0 in | Wt 200.0 lb

## 2021-11-18 DIAGNOSIS — I2510 Atherosclerotic heart disease of native coronary artery without angina pectoris: Secondary | ICD-10-CM | POA: Diagnosis not present

## 2021-11-18 DIAGNOSIS — I10 Essential (primary) hypertension: Secondary | ICD-10-CM | POA: Diagnosis not present

## 2021-11-18 DIAGNOSIS — E7800 Pure hypercholesterolemia, unspecified: Secondary | ICD-10-CM | POA: Diagnosis not present

## 2021-11-18 MED ORDER — ASPIRIN EC 81 MG PO TBEC: 81.0000 mg | DELAYED_RELEASE_TABLET | Freq: Every day | ORAL | 3 refills | Status: AC

## 2021-11-18 NOTE — Patient Instructions (Addendum)
START ASPIRIN 81 MG ONCE DAILY ? ? ? ?Follow-Up: ?At [facility], you and your health needs are our priority.  As part of our continuing mission to provide you with exceptional heart care, we have created designated Provider Care Teams.  These Care Teams include your primary Cardiologist (physician) and Advanced Practice Providers (APPs -  Physician Assistants and Nurse Practitioners) who all work together to provide you with the care you need, when you need it. ? ?We recommend signing up for the patient portal called "MyChart".  Sign up information is provided on this After Visit Summary.  MyChart is used to connect with patients for Virtual Visits (Telemedicine).  Patients are able to view lab/test results, encounter notes, upcoming appointments, etc.  Non-urgent messages can be sent to your provider as well.   ?To learn more about what you can do with MyChart, go to [email or web address].   ? ?Your next appointment:   ?12 month(s) ? ?The format for your next appointment:   ?In Person ? ?Provider:   ?Kirk Ruths MD  ? ? ? ?

## 2021-11-20 DIAGNOSIS — G5601 Carpal tunnel syndrome, right upper limb: Secondary | ICD-10-CM | POA: Diagnosis not present

## 2021-11-20 DIAGNOSIS — M65332 Trigger finger, left middle finger: Secondary | ICD-10-CM | POA: Diagnosis not present

## 2021-12-18 DIAGNOSIS — M533 Sacrococcygeal disorders, not elsewhere classified: Secondary | ICD-10-CM | POA: Diagnosis not present

## 2021-12-18 DIAGNOSIS — M791 Myalgia, unspecified site: Secondary | ICD-10-CM | POA: Diagnosis not present

## 2022-03-12 DIAGNOSIS — G5601 Carpal tunnel syndrome, right upper limb: Secondary | ICD-10-CM | POA: Diagnosis not present

## 2022-03-12 DIAGNOSIS — M65332 Trigger finger, left middle finger: Secondary | ICD-10-CM | POA: Diagnosis not present

## 2022-03-18 DIAGNOSIS — G5601 Carpal tunnel syndrome, right upper limb: Secondary | ICD-10-CM | POA: Diagnosis not present

## 2022-05-14 DIAGNOSIS — L812 Freckles: Secondary | ICD-10-CM | POA: Diagnosis not present

## 2022-05-14 DIAGNOSIS — D1801 Hemangioma of skin and subcutaneous tissue: Secondary | ICD-10-CM | POA: Diagnosis not present

## 2022-05-14 DIAGNOSIS — D3612 Benign neoplasm of peripheral nerves and autonomic nervous system, upper limb, including shoulder: Secondary | ICD-10-CM | POA: Diagnosis not present

## 2022-05-14 DIAGNOSIS — D2272 Melanocytic nevi of left lower limb, including hip: Secondary | ICD-10-CM | POA: Diagnosis not present

## 2022-05-14 DIAGNOSIS — L820 Inflamed seborrheic keratosis: Secondary | ICD-10-CM | POA: Diagnosis not present

## 2022-05-14 DIAGNOSIS — L565 Disseminated superficial actinic porokeratosis (DSAP): Secondary | ICD-10-CM | POA: Diagnosis not present

## 2022-05-14 DIAGNOSIS — Z85820 Personal history of malignant melanoma of skin: Secondary | ICD-10-CM | POA: Diagnosis not present

## 2022-05-14 DIAGNOSIS — L821 Other seborrheic keratosis: Secondary | ICD-10-CM | POA: Diagnosis not present

## 2022-05-14 DIAGNOSIS — L2089 Other atopic dermatitis: Secondary | ICD-10-CM | POA: Diagnosis not present

## 2022-05-14 DIAGNOSIS — Z85828 Personal history of other malignant neoplasm of skin: Secondary | ICD-10-CM | POA: Diagnosis not present

## 2022-05-20 DIAGNOSIS — M533 Sacrococcygeal disorders, not elsewhere classified: Secondary | ICD-10-CM | POA: Diagnosis not present

## 2022-05-23 ENCOUNTER — Other Ambulatory Visit: Payer: Self-pay | Admitting: Cardiology

## 2022-05-23 DIAGNOSIS — E7800 Pure hypercholesterolemia, unspecified: Secondary | ICD-10-CM

## 2022-05-23 DIAGNOSIS — I2510 Atherosclerotic heart disease of native coronary artery without angina pectoris: Secondary | ICD-10-CM

## 2022-05-23 DIAGNOSIS — I10 Essential (primary) hypertension: Secondary | ICD-10-CM

## 2022-05-27 DIAGNOSIS — M25512 Pain in left shoulder: Secondary | ICD-10-CM | POA: Diagnosis not present

## 2022-05-29 DIAGNOSIS — H52203 Unspecified astigmatism, bilateral: Secondary | ICD-10-CM | POA: Diagnosis not present

## 2022-05-29 DIAGNOSIS — Z961 Presence of intraocular lens: Secondary | ICD-10-CM | POA: Diagnosis not present

## 2022-06-02 DIAGNOSIS — Z8709 Personal history of other diseases of the respiratory system: Secondary | ICD-10-CM | POA: Diagnosis not present

## 2022-06-02 DIAGNOSIS — J40 Bronchitis, not specified as acute or chronic: Secondary | ICD-10-CM | POA: Diagnosis not present

## 2022-06-02 DIAGNOSIS — R051 Acute cough: Secondary | ICD-10-CM | POA: Diagnosis not present

## 2022-07-30 ENCOUNTER — Other Ambulatory Visit: Payer: Self-pay | Admitting: Internal Medicine

## 2022-07-30 ENCOUNTER — Other Ambulatory Visit: Payer: Self-pay | Admitting: *Deleted

## 2022-07-30 DIAGNOSIS — M533 Sacrococcygeal disorders, not elsewhere classified: Secondary | ICD-10-CM | POA: Diagnosis not present

## 2022-07-30 DIAGNOSIS — M25512 Pain in left shoulder: Secondary | ICD-10-CM | POA: Diagnosis not present

## 2022-07-30 MED ORDER — TERAZOSIN HCL 10 MG PO CAPS: 10.0000 mg | ORAL_CAPSULE | Freq: Every day | ORAL | 0 refills | Status: DC

## 2022-08-30 ENCOUNTER — Other Ambulatory Visit: Payer: Self-pay | Admitting: Internal Medicine

## 2022-09-07 ENCOUNTER — Ambulatory Visit: Payer: Medicare Other | Admitting: Primary Care

## 2022-09-07 ENCOUNTER — Encounter: Payer: Self-pay | Admitting: Primary Care

## 2022-09-07 VITALS — BP 142/76 | HR 69 | Ht 70.0 in | Wt 200.8 lb

## 2022-09-07 DIAGNOSIS — J479 Bronchiectasis, uncomplicated: Secondary | ICD-10-CM | POA: Diagnosis not present

## 2022-09-07 DIAGNOSIS — J310 Chronic rhinitis: Secondary | ICD-10-CM

## 2022-09-07 DIAGNOSIS — I77810 Thoracic aortic ectasia: Secondary | ICD-10-CM | POA: Diagnosis not present

## 2022-09-07 DIAGNOSIS — R918 Other nonspecific abnormal finding of lung field: Secondary | ICD-10-CM

## 2022-09-07 MED ORDER — IPRATROPIUM BROMIDE 0.03 % NA SOLN: 2.0000 | Freq: Two times a day (BID) | NASAL | 5 refills | Status: DC

## 2022-09-07 NOTE — Patient Instructions (Addendum)
Recommendations: Continue mucinex '1200mg'$  twice a day Use saline nebulizer once a day in the morning (increase 2-3 times a day if needed to loosen congestion) Use flutter valve after nebulizer Try using Atrovent nasal spray twice daily to help with PND and throat clearing (Ok to use afrin on occasion) Continue Claritin '10mg'$  daily  Orders: CTA re: bronchiectasis and Ectasia the ascending thoracic aorta   Follow-up: 6 months with Dr. Valeta Harms or sooner if needed

## 2022-09-07 NOTE — Progress Notes (Signed)
$'@Patient'N$  ID: Curtis Jacobs, male    DOB: [birth year], 89 y.o.   MRN: [id number]  Chief Complaint  Patient presents with   Follow-up    1 year f/u  Breathing  is the same. Coughing,SOB.    Referring provider: Plotnikov, Evie Lacks, MD  HPI: 89 year old gentleman, past medical history of BPH, hypertension, hyperlipidemia.  Patient has been been followed for many years in the past by Dr. Lake Bells.  Last CT scan of the chest was in 2016 to follow a left upper lobe pulmonary nodule.  He had 1 nodule resected in the right upper lobe many years ago that was benign.  From a respiratory standpoint he has been doing well he uses his nebulizer rarely.  He tries to get up every single day with a airway clearance routine.  Lots of coughing and clearing of his chest.  He starts with a dry cough in the morning usually becomes more productive for about 15 to 20 minutes of airway clearance.  Then he is fine for the rest of the day.  He does take guaifenesin daily.  He has been on this also for several years.  Of note he states he had pneumonia that almost killed him many years ago was in the ICU.  Felt to have bronchiectasis and scarring related to his previous illness.  Occupation: Patient is a retired Dealer, Chief Financial Officer.   Previous [facility] pulmonary encounter:  OV 09/05/2021: Here today for follow-up.Followed in pulmonary clinic with associated bilateral pulmonary nodules and bronchiectasis. Last year to rounds of abx. Nothing recently. Unable to get hypertonic saline from pharmacy. Not using his nebs regularly.  Patient has regular episodes in the morning that requires persistent coughing to get his congestion cleared.   This is a 89 year old gentleman for a long time history of following in clinic secondary to bilateral pulmonary nodules and associated bronchiectasis.  In the past has been admitted to the hospital and even the intensive care unit for pneumonia.  He has a daily regimen and of airway clearance  techniques and cough and sputum production.  Plan: Continue guaifenesin Continue DuoNebs. Patient has not been really using any of the hypertonic saline because he been unable to get that at local pharmacy. Has not really been using his DuoNebs either.  So I encouraged him to swap that out to at least use DuoNeb once in the morning once in the evening help with airway clearance. Continue flutter valve use daily. Return to clinic in 1 year or as needed.  09/07/2022 Patient presents today for annual follow-up/ bronchiectasis and pulmonary nodules. He has chronic np cough. Cough is congested but not productive. No purulence to sputum. Congestion is worse in the morning. He does a lot of throat clearing. He does not use nebulizer or flutter valve regularly. He uses sodium chloride nebulizer when needed. He takes '1200mg'$  mucinex twice daily. Needs CTA to follow-up on bronchiectasis and Ectasia the ascending thoracic aorta. Denies shortness of breath, abdominal or back pain     Allergies  Allergen Reactions   Cordran [Flurandrenolide] Rash    rash   Lisinopril Cough    REACTION: cough    Immunization History  Administered Date(s) Administered   Fluad Quad(high Dose 65+) 05/11/2022   Influenza Split 05/29/2011, 05/17/2012   Influenza Whole 07/07/2004, 05/24/2010   Influenza, High Dose Seasonal PF 06/03/2015, 05/19/2016, 05/19/2017, 05/19/2018, 05/10/2019   Influenza-Unspecified 06/16/2013, 04/17/2014   Moderna Sars-Covid-2 Vaccination 09/29/2019, 10/27/2019, 06/26/2020   Pneumococcal Conjugate-13 07/07/2013, 12/10/2020  Pneumococcal Polysaccharide-23 06/07/2009, 01/14/2016, 11/30/2016   Td 06/17/2012   Tdap 11/04/2015   Zoster Recombinat (Shingrix) 02/08/2018, 05/19/2018   Zoster, Live 08/19/2010    Past Medical History:  Diagnosis Date   BPH (benign prostatic hypertrophy)    Dr. Risa Grill   CAD (coronary artery disease)    HA (headache)    Traction- Dr. Jannifer Franklin- had MRI.MRA   HTN  (hypertension)    Hyperlipidemia     Tobacco History: Social History   Tobacco Use  Smoking Status Former   Packs/day: 0.10   Years: 3.00   Total pack years: 0.30   Types: Cigarettes   Quit date: 07/25/1952   Years since quitting: 70.1  Smokeless Tobacco Never  Tobacco Comments   used to "smoke but not inhale"   Counseling given: Not Answered Tobacco comments: used to "smoke but not inhale"   Outpatient Medications Prior to Visit  Medication Sig Dispense Refill   aspirin EC 81 MG tablet Take 1 tablet (81 mg total) by mouth daily. Swallow whole. 90 tablet 3   cetirizine (ZYRTEC) 10 MG tablet Take 1 tablet by mouth daily.     Cholecalciferol 1000 UNITS tablet Take 2,000 Units by mouth daily.     COMIRNATY syringe      Dapsone 7.5 % GEL Apply 1 application topically daily. 90 g 1   fluocinonide cream (LIDEX) 1.61 % Apply 1 application topically daily.     FLUZONE HIGH-DOSE QUADRIVALENT 0.7 ML SUSY      Glucosamine-Chondroit-Vit C-Mn (GLUCOSAMINE CHONDROITIN COMPLX) CAPS Take 1 capsule by mouth daily.     guaiFENesin (MUCINEX) 600 MG 12 hr tablet Take 2 tablets (1,200 mg total) by mouth 2 (two) times daily. (Patient taking differently: Take 1,200 mg by mouth daily.)     loratadine (CLARITIN) 10 MG tablet Take 10 mg by mouth daily.     Multiple Vitamin (MULTIVITAMIN) tablet Take 1 tablet by mouth daily.     mupirocin ointment (BACTROBAN) 2 %      rosuvastatin (CRESTOR) 20 MG tablet TAKE 1 TABLET(20 MG) BY MOUTH DAILY 90 tablet 3   Saw Palmetto POWD Take by mouth daily.     sodium chloride HYPERTONIC 3 % nebulizer solution Take by nebulization as needed for other. Use this 2 times a day for cough. 750 mL 5   terazosin (HYTRIN) 10 MG capsule TAKE 1 CAPSULE BY MOUTH AT BEDTIME. OVERDUE FOR ANNUAL APPT W/LABS MUST SEE PROVIDER FOR FUTURE REFILLS 30 capsule 5   triamcinolone ointment (KENALOG) 0.1 % APPLY EXTERNALLY TO THE AFFECTED AREA THREE TIMES DAILY 80 g 3   vitamin C (ASCORBIC  ACID) 500 MG tablet Take 500 mg by mouth daily.     ipratropium-albuterol (DUONEB) 0.5-2.5 (3) MG/3ML SOLN Take 3 mLs by nebulization every 6 (six) hours as needed. (Patient not taking: Reported on 09/07/2022) 120 mL 3   No facility-administered medications prior to visit.   Review of Systems  Review of Systems  Constitutional: Negative.   HENT: Negative.    Respiratory:  Positive for cough.   Cardiovascular: Negative.     Physical Exam  BP (!) 142/76 (BP Location: Left Arm, Patient Position: Sitting, Cuff Size: Normal)   Pulse 69   Ht '5\' 10"'$  (1.778 m)   Wt 200 lb 13.6 oz (91.1 kg)   SpO2 96%   BMI 28.82 kg/m  Physical Exam Constitutional:      Appearance: Normal appearance.  HENT:     Head: Normocephalic and atraumatic.  Cardiovascular:  Rate and Rhythm: Normal rate and regular rhythm.  Pulmonary:     Effort: Pulmonary effort is normal.     Breath sounds: Rhonchi present. No wheezing or rales.     Comments: L>R Musculoskeletal:        General: Normal range of motion.  Skin:    General: Skin is warm and dry.  Neurological:     General: No focal deficit present.     Mental Status: He is alert and oriented to person, place, and time. Mental status is at baseline.  Psychiatric:        Mood and Affect: Mood normal.        Behavior: Behavior normal.        Thought Content: Thought content normal.        Judgment: Judgment normal.      Lab Results:  CBC    Component Value Date/Time   WBC 5.3 02/02/2019 1034   RBC 4.55 02/02/2019 1034   HGB 14.4 02/02/2019 1034   HCT 42.6 02/02/2019 1034   PLT 177.0 02/02/2019 1034   MCV 93.7 02/02/2019 1034   MCHC 33.8 02/02/2019 1034   RDW 13.3 02/02/2019 1034   LYMPHSABS 1.5 02/02/2019 1034   MONOABS 0.4 02/02/2019 1034   EOSABS 0.1 02/02/2019 1034   BASOSABS 0.0 02/02/2019 1034    BMET    Component Value Date/Time   NA 140 05/23/2020 1415   K 4.1 05/23/2020 1415   CL 107 05/23/2020 1415   CO2 30 05/23/2020 1415    GLUCOSE 96 05/23/2020 1415   BUN 20 05/23/2020 1415   CREATININE 1.24 05/23/2020 1415   CALCIUM 9.7 05/23/2020 1415   GFRNONAA 73.97 08/19/2010 0905   GFRAA 85 03/19/2008 0814    BNP No results found for: "BNP"  ProBNP No results found for: "PROBNP"  Imaging: No results found.   Assessment & Plan:   Bronchiectasis without acute exacerbation ([place]) - Felt to be post infections, patient had bad pneumonia in 1970s. Bronchiectasis is not acutely exacerbated. He keeps a chronic cough which is typically non-productive. He takes mucinex twice daily but is not consistent with nebulizers or flutter valve. Advised he use hypertonic saline nebulizer once daily in the morning followed by flutter valve. Duoneb q6 hours prn shortness of breath/wheezing. Advised he notify office if he developed worsen cough, purulent mucus or fevers. We will get CTA to follow-up on bronchiectasis and extasia of ascending thoracic aorta. FU in 6 months or sooner.   Multiple pulmonary nodules - Dominate nodules are chronic/benign. No dedicated follow-up required   Aortic ectasia, thoracic (HCC) - CT chest wo contrast Dec 2022>> Ectasia the ascending thoracic aorta, measuring 4.0 cm - Needs annual follow-up, CTA ordered   Rhinitis - PND contributing to throat clearing  - Start Atrovent nasal spray twice daily - Continue Claritin '10mg'$  daily    Martyn Ehrich, NP 09/07/2022

## 2022-09-07 NOTE — Assessment & Plan Note (Addendum)
-  Felt to be post infections, patient had bad pneumonia in 1970s. Bronchiectasis is not acutely exacerbated. He keeps a chronic cough which is typically non-productive. He takes mucinex twice daily but is not consistent with nebulizers or flutter valve. Advised he use hypertonic saline nebulizer once daily in the morning followed by flutter valve. Duoneb q6 hours prn shortness of breath/wheezing. Advised he notify office if he developed worsen cough, purulent mucus or fevers. We will get CTA to follow-up on bronchiectasis and extasia of ascending thoracic aorta. FU in 6 months or sooner.

## 2022-09-07 NOTE — Assessment & Plan Note (Signed)
-  PND contributing to throat clearing  - Start Atrovent nasal spray twice daily - Continue Claritin '10mg'$  daily

## 2022-09-07 NOTE — Assessment & Plan Note (Addendum)
-  Dominate nodules are chronic/benign. No dedicated follow-up required

## 2022-09-07 NOTE — Assessment & Plan Note (Signed)
-  CT chest wo contrast Dec 2022>> Ectasia the ascending thoracic aorta, measuring 4.0 cm - Needs annual follow-up, CTA ordered

## 2022-09-10 ENCOUNTER — Ambulatory Visit: Payer: Medicare Other | Admitting: Internal Medicine

## 2022-09-14 ENCOUNTER — Ambulatory Visit (HOSPITAL_COMMUNITY): Admission: RE | Admit: 2022-09-14 | Payer: Medicare Other | Source: Ambulatory Visit

## 2022-09-21 ENCOUNTER — Ambulatory Visit (INDEPENDENT_AMBULATORY_CARE_PROVIDER_SITE_OTHER): Payer: Medicare Other | Admitting: Internal Medicine

## 2022-09-21 ENCOUNTER — Encounter: Payer: Self-pay | Admitting: Internal Medicine

## 2022-09-21 VITALS — BP 122/68 | HR 80 | Temp 98.6°F | Ht 70.0 in | Wt 198.0 lb

## 2022-09-21 DIAGNOSIS — E785 Hyperlipidemia, unspecified: Secondary | ICD-10-CM

## 2022-09-21 DIAGNOSIS — I10 Essential (primary) hypertension: Secondary | ICD-10-CM

## 2022-09-21 DIAGNOSIS — N401 Enlarged prostate with lower urinary tract symptoms: Secondary | ICD-10-CM

## 2022-09-21 DIAGNOSIS — Z0000 Encounter for general adult medical examination without abnormal findings: Secondary | ICD-10-CM | POA: Diagnosis not present

## 2022-09-21 NOTE — Progress Notes (Signed)
Subjective:  Patient ID: Curtis Jacobs, male    DOB: 04-29-1933  Age: 89 y.o. MRN: [id number]  CC: Annual Exam   HPI Curtis Jacobs presents for a well exam. Wife is ill.  Per Pulmonary:  Bronchiectasis without acute exacerbation ([place]) - Felt to be post infections, patient had bad pneumonia in 1970s. Bronchiectasis is not acutely exacerbated. He keeps a chronic cough which is typically non-productive. He takes mucinex twice daily but is not consistent with nebulizers or flutter valve. Advised he use hypertonic saline nebulizer once daily in the morning followed by flutter valve. Duoneb q6 hours prn shortness of breath/wheezing. Advised he notify office if he developed worsen cough, purulent mucus or fevers. We will get CTA to follow-up on bronchiectasis and extasia of ascending thoracic aorta. FU in 6 months or sooner.    Multiple pulmonary nodules - Dominate nodules are chronic/benign. No dedicated follow-up required    Aortic ectasia, thoracic (HCC) - CT chest wo contrast Dec 2022>> Ectasia the ascending thoracic aorta, measuring 4.0 cm - Needs annual follow-up, CTA ordered    Rhinitis - PND contributing to throat clearing  - Start Atrovent nasal spray twice daily - Continue Claritin '10mg'$  daily     Outpatient Medications Prior to Visit  Medication Sig Dispense Refill   aspirin EC 81 MG tablet Take 1 tablet (81 mg total) by mouth daily. Swallow whole. 90 tablet 3   cetirizine (ZYRTEC) 10 MG tablet Take 1 tablet by mouth daily.     Cholecalciferol 1000 UNITS tablet Take 2,000 Units by mouth daily.     COMIRNATY syringe      Dapsone 7.5 % GEL Apply 1 application topically daily. 90 g 1   fluocinonide cream (LIDEX) [id number] % Apply 1 application topically daily.     FLUZONE HIGH-DOSE QUADRIVALENT 0.7 ML SUSY      Glucosamine-Chondroit-Vit C-Mn (GLUCOSAMINE CHONDROITIN COMPLX) CAPS Take 1 capsule by mouth daily.     guaiFENesin (MUCINEX) 600 MG 12 hr tablet Take 2 tablets (1,200 mg  total) by mouth 2 (two) times daily. (Patient taking differently: Take 1,200 mg by mouth daily.)     ipratropium (ATROVENT) 0.03 % nasal spray Place 2 sprays into both nostrils every 12 (twelve) hours. 30 mL 5   loratadine (CLARITIN) 10 MG tablet Take 10 mg by mouth daily.     Multiple Vitamin (MULTIVITAMIN) tablet Take 1 tablet by mouth daily.     mupirocin ointment (BACTROBAN) 2 %      rosuvastatin (CRESTOR) 20 MG tablet TAKE 1 TABLET(20 MG) BY MOUTH DAILY 90 tablet 3   Saw Palmetto POWD Take by mouth daily.     sodium chloride HYPERTONIC 3 % nebulizer solution Take by nebulization as needed for other. Use this 2 times a day for cough. 750 mL 5   terazosin (HYTRIN) 10 MG capsule TAKE 1 CAPSULE BY MOUTH AT BEDTIME. OVERDUE FOR ANNUAL APPT W/LABS MUST SEE PROVIDER FOR FUTURE REFILLS 30 capsule 5   triamcinolone ointment (KENALOG) 0.1 % APPLY EXTERNALLY TO THE AFFECTED AREA THREE TIMES DAILY 80 g 3   vitamin C (ASCORBIC ACID) 500 MG tablet Take 500 mg by mouth daily.     ipratropium-albuterol (DUONEB) 0.5-2.5 (3) MG/3ML SOLN Take 3 mLs by nebulization every 6 (six) hours as needed. (Patient not taking: Reported on 09/07/2022) 120 mL 3   No facility-administered medications prior to visit.    ROS: Review of Systems  Constitutional:  Negative for appetite change, fatigue and unexpected weight  change.  HENT:  Negative for congestion, nosebleeds, sneezing, sore throat and trouble swallowing.   Eyes:  Negative for itching and visual disturbance.  Respiratory:  Negative for cough.   Cardiovascular:  Negative for chest pain, palpitations and leg swelling.  Gastrointestinal:  Negative for abdominal distention, blood in stool, diarrhea and nausea.  Genitourinary:  Negative for frequency and hematuria.  Musculoskeletal:  Negative for back pain, gait problem, joint swelling and neck pain.  Skin:  Negative for rash.  Neurological:  Negative for dizziness, tremors, speech difficulty and weakness.   Psychiatric/Behavioral:  Negative for agitation, dysphoric mood and sleep disturbance. The patient is not nervous/anxious.     Objective:  BP 122/68 (BP Location: Left Arm, Patient Position: Sitting, Cuff Size: Large)   Pulse 80   Temp 98.6 F (37 C) (Oral)   Ht '5\' 10"'$  (1.778 m)   Wt 198 lb (89.8 kg)   SpO2 97%   BMI 28.41 kg/m   BP Readings from Last 3 Encounters:  09/21/22 122/68  09/07/22 (!) 142/76  11/18/21 120/64    Wt Readings from Last 3 Encounters:  09/21/22 198 lb (89.8 kg)  09/07/22 200 lb 13.6 oz (91.1 kg)  11/18/21 200 lb (90.7 kg)    Physical Exam Constitutional:      General: He is not in acute distress.    Appearance: He is well-developed. He is obese.     Comments: NAD  Eyes:     Conjunctiva/sclera: Conjunctivae normal.     Pupils: Pupils are equal, round, and reactive to light.  Neck:     Thyroid: No thyromegaly.     Vascular: No JVD.  Cardiovascular:     Rate and Rhythm: Normal rate and regular rhythm.     Heart sounds: Normal heart sounds. No murmur heard.    No friction rub. No gallop.  Pulmonary:     Effort: Pulmonary effort is normal. No respiratory distress.     Breath sounds: Normal breath sounds. No wheezing or rales.  Chest:     Chest wall: No tenderness.  Abdominal:     General: Bowel sounds are normal. There is no distension.     Palpations: Abdomen is soft. There is no mass.     Tenderness: There is no abdominal tenderness. There is no guarding or rebound.  Musculoskeletal:        General: No tenderness. Normal range of motion.     Cervical back: Normal range of motion.  Lymphadenopathy:     Cervical: No cervical adenopathy.  Skin:    General: Skin is warm and dry.     Findings: No rash.  Neurological:     Mental Status: He is alert and oriented to person, place, and time.     Cranial Nerves: No cranial nerve deficit.     Motor: No abnormal muscle tone.     Coordination: Coordination normal.     Gait: Gait normal.      Deep Tendon Reflexes: Reflexes are normal and symmetric.  Psychiatric:        Behavior: Behavior normal.        Thought Content: Thought content normal.        Judgment: Judgment normal.     Lab Results  Component Value Date   WBC 5.3 02/02/2019   HGB 14.4 02/02/2019   HCT 42.6 02/02/2019   PLT 177.0 02/02/2019   GLUCOSE 96 05/23/2020   CHOL 142 07/15/2021   TRIG 147 07/15/2021   HDL 42 07/15/2021  LDLDIRECT 101.0 01/10/2015   LDLCALC 74 07/15/2021   ALT 16 07/15/2021   AST 18 07/15/2021   NA 140 05/23/2020   K 4.1 05/23/2020   CL 107 05/23/2020   CREATININE 1.24 05/23/2020   BUN 20 05/23/2020   CO2 30 05/23/2020   TSH 0.55 07/28/2018   PSA 3.55 07/28/2018   HGBA1C 5.8 02/02/2019    CT Chest Wo Contrast  Result Date: 08/09/2021 CLINICAL DATA:  Follow-up pulmonary nodule, bronchiectasis EXAM: CT CHEST WITHOUT CONTRAST TECHNIQUE: Multidetector CT imaging of the chest was performed following the standard protocol without IV contrast. COMPARISON:  CT chest dated 02/05/2021 FINDINGS: Cardiovascular: The heart is normal in size. No pericardial effusion. Ectasia of the ascending thoracic aorta, measuring 4.0 cm. Atherosclerotic calcifications of the aortic root/arch. Three vessel coronary atherosclerosis. Mediastinum/Nodes: No suspicious mediastinal lymphadenopathy. Visualized thyroid is unremarkable. Lungs/Pleura: Biapical pleural-parenchymal scarring. Status post partial right upper lobe wedge resection. Upper lobe predominant bronchiectasis with scattered peribronchovascular nodularity, similar to the prior. 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 Dominant nodules including a 12 mm partially calcified nodule in the posterior left upper lobe are chronic (dating back multiple years) and benign. No pleural effusion or pneumothorax. Upper Abdomen: Visualized upper abdomen is notable for cholelithiasis and vascular calcifications.  Musculoskeletal: Mild degenerative changes of the visualized thoracolumbar spine. IMPRESSION: Status post right upper lobe wedge resection. 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 Additional dominant nodules are chronic/benign. Dedicated follow-up imaging is not required. Ectasia the ascending thoracic aorta, measuring 4.0 cm. Recommend annual imaging followup by CTA or MRA. This recommendation follows 2010 ACCF/AHA/AATS/ACR/ASA/SCA/SCAI/SIR/STS/SVM Guidelines for the Diagnosis and Management of Patients with Thoracic Aortic Disease. Circulation. 2010; [id number]. Aortic aneurysm NOS (ICD10-I71.9) Aortic Atherosclerosis (ICD10-I70.0). Electronically Signed   By: Julian Hy M.D.   On: 08/09/2021 08:45    Assessment & Plan:   Problem List Items Addressed This Visit       Cardiovascular and Mediastinum   Essential hypertension    Nl BP at home        Genitourinary   BPH (benign prostatic hyperplasia)   Relevant Orders   PSA     Other   Dyslipidemia - Primary   Relevant Orders   TSH   Lipid panel   Well adult exam   Relevant Orders   TSH   Urinalysis   CBC with Differential/Platelet   Lipid panel   PSA   Comprehensive metabolic panel      No orders of the defined types were placed in this encounter.     Follow-up: Return in about 6 months (around 03/22/2023) for a follow-up visit.  Walker Kehr, MD

## 2022-09-21 NOTE — Assessment & Plan Note (Signed)
Nl BP at home 

## 2022-10-11 ENCOUNTER — Encounter: Payer: Self-pay | Admitting: Internal Medicine

## 2022-10-11 NOTE — Assessment & Plan Note (Signed)

## 2022-10-29 DIAGNOSIS — M65311 Trigger thumb, right thumb: Secondary | ICD-10-CM | POA: Diagnosis not present

## 2022-10-29 DIAGNOSIS — M65332 Trigger finger, left middle finger: Secondary | ICD-10-CM | POA: Diagnosis not present

## 2022-11-04 DIAGNOSIS — M791 Myalgia, unspecified site: Secondary | ICD-10-CM | POA: Diagnosis not present

## 2022-11-04 DIAGNOSIS — M533 Sacrococcygeal disorders, not elsewhere classified: Secondary | ICD-10-CM | POA: Diagnosis not present

## 2022-11-05 ENCOUNTER — Telehealth: Payer: Self-pay

## 2022-11-05 NOTE — Telephone Encounter (Signed)
Called patient to schedule Medicare Annual Wellness Visit (AWV). Unable to reach patient.  Last date of AWV: 05/27/20  Please schedule an appointment at any time with [facility].  Norton Blizzard, [place] ([facility])  [facility] [phone]

## 2022-11-12 DIAGNOSIS — D2271 Melanocytic nevi of right lower limb, including hip: Secondary | ICD-10-CM | POA: Diagnosis not present

## 2022-11-12 DIAGNOSIS — D2272 Melanocytic nevi of left lower limb, including hip: Secondary | ICD-10-CM | POA: Diagnosis not present

## 2022-11-12 DIAGNOSIS — Z85820 Personal history of malignant melanoma of skin: Secondary | ICD-10-CM | POA: Diagnosis not present

## 2022-11-12 DIAGNOSIS — D1801 Hemangioma of skin and subcutaneous tissue: Secondary | ICD-10-CM | POA: Diagnosis not present

## 2022-11-12 DIAGNOSIS — Z85828 Personal history of other malignant neoplasm of skin: Secondary | ICD-10-CM | POA: Diagnosis not present

## 2022-11-12 DIAGNOSIS — L820 Inflamed seborrheic keratosis: Secondary | ICD-10-CM | POA: Diagnosis not present

## 2022-11-12 DIAGNOSIS — L821 Other seborrheic keratosis: Secondary | ICD-10-CM | POA: Diagnosis not present

## 2022-11-12 DIAGNOSIS — D3612 Benign neoplasm of peripheral nerves and autonomic nervous system, upper limb, including shoulder: Secondary | ICD-10-CM | POA: Diagnosis not present

## 2023-02-08 DIAGNOSIS — M533 Sacrococcygeal disorders, not elsewhere classified: Secondary | ICD-10-CM | POA: Diagnosis not present

## 2023-02-08 DIAGNOSIS — M25512 Pain in left shoulder: Secondary | ICD-10-CM | POA: Diagnosis not present

## 2023-02-25 ENCOUNTER — Telehealth: Payer: Self-pay | Admitting: Internal Medicine

## 2023-02-25 ENCOUNTER — Other Ambulatory Visit: Payer: Self-pay | Admitting: Internal Medicine

## 2023-02-25 MED ORDER — TERAZOSIN HCL 10 MG PO CAPS: 10.0000 mg | ORAL_CAPSULE | Freq: Every day | ORAL | 0 refills | Status: DC

## 2023-02-25 NOTE — Telephone Encounter (Signed)
Next OV is 03/25/2023.   Prescription Request  02/25/2023  LOV: 09/21/2022  What is the name of the medication or equipment? terazosin (HYTRIN) 10 MG capsule   Have you contacted your pharmacy to request a refill? Yes   Which pharmacy would you like this sent to?  [id number] [place], [place] - [address] AT [facility] OF [address] & [facility] [address] [place] [address] Phone: [phone] Fax: [phone]    Patient notified that their request is being sent to the clinical staff for review and that they should receive a response within 2 business days.   Please advise at [facility] [phone]

## 2023-02-25 NOTE — Telephone Encounter (Signed)
Sent 30 day until appt.../lmb 

## 2023-03-11 DIAGNOSIS — M533 Sacrococcygeal disorders, not elsewhere classified: Secondary | ICD-10-CM | POA: Diagnosis not present

## 2023-03-25 ENCOUNTER — Ambulatory Visit: Payer: Medicare Other | Admitting: Internal Medicine

## 2023-04-05 ENCOUNTER — Ambulatory Visit (INDEPENDENT_AMBULATORY_CARE_PROVIDER_SITE_OTHER): Payer: Medicare Other

## 2023-04-05 ENCOUNTER — Ambulatory Visit (INDEPENDENT_AMBULATORY_CARE_PROVIDER_SITE_OTHER): Payer: Medicare Other | Admitting: Internal Medicine

## 2023-04-05 ENCOUNTER — Encounter: Payer: Self-pay | Admitting: Internal Medicine

## 2023-04-05 VITALS — BP 140/70 | HR 75 | Temp 98.3°F | Ht 70.0 in | Wt 195.0 lb

## 2023-04-05 VITALS — BP 130/72 | HR 75 | Temp 98.3°F | Resp 12 | Ht 70.0 in | Wt 195.0 lb

## 2023-04-05 DIAGNOSIS — C4321 Malignant melanoma of right ear and external auricular canal: Secondary | ICD-10-CM

## 2023-04-05 DIAGNOSIS — Z0000 Encounter for general adult medical examination without abnormal findings: Secondary | ICD-10-CM

## 2023-04-05 DIAGNOSIS — R3129 Other microscopic hematuria: Secondary | ICD-10-CM

## 2023-04-05 DIAGNOSIS — N401 Enlarged prostate with lower urinary tract symptoms: Secondary | ICD-10-CM | POA: Diagnosis not present

## 2023-04-05 DIAGNOSIS — E785 Hyperlipidemia, unspecified: Secondary | ICD-10-CM

## 2023-04-05 DIAGNOSIS — D485 Neoplasm of uncertain behavior of skin: Secondary | ICD-10-CM

## 2023-04-05 DIAGNOSIS — D649 Anemia, unspecified: Secondary | ICD-10-CM | POA: Diagnosis not present

## 2023-04-05 DIAGNOSIS — I10 Essential (primary) hypertension: Secondary | ICD-10-CM

## 2023-04-05 LAB — CBC WITH DIFFERENTIAL/PLATELET
Basophils Absolute: 0 10*3/uL (ref 0.0–0.1)
Basophils Relative: 0.8 % (ref 0.0–3.0)
Eosinophils Absolute: 0.1 10*3/uL (ref 0.0–0.7)
Eosinophils Relative: 1.1 % (ref 0.0–5.0)
HCT: 37.1 % — ABNORMAL LOW (ref 39.0–52.0)
Hemoglobin: 12.2 g/dL — ABNORMAL LOW (ref 13.0–17.0)
Lymphocytes Relative: 28.9 % (ref 12.0–46.0)
Lymphs Abs: 1.6 10*3/uL (ref 0.7–4.0)
MCHC: 32.9 g/dL (ref 30.0–36.0)
MCV: 95.5 fl (ref 78.0–100.0)
Monocytes Absolute: 0.6 10*3/uL (ref 0.1–1.0)
Monocytes Relative: 10.4 % (ref 3.0–12.0)
Neutro Abs: 3.3 10*3/uL (ref 1.4–7.7)
Neutrophils Relative %: 58.8 % (ref 43.0–77.0)
Platelets: 113 10*3/uL — ABNORMAL LOW (ref 150.0–400.0)
RBC: 3.89 Mil/uL — ABNORMAL LOW (ref 4.22–5.81)
RDW: 14.1 % (ref 11.5–15.5)
WBC: 5.6 10*3/uL (ref 4.0–10.5)

## 2023-04-05 LAB — PSA: PSA: 3.89 ng/mL (ref 0.10–4.00)

## 2023-04-05 LAB — TSH: TSH: 1.46 u[IU]/mL (ref 0.35–5.50)

## 2023-04-05 NOTE — Progress Notes (Addendum)
Subjective:   Curtis Jacobs is a 89 y.o. male who presents for Medicare Annual/Subsequent preventive examination.  Visit Complete: In person  Review of Systems    No ROS. Medicare Wellness Visit. Additional risk factors are reflected in social history.  Sleep Patterns: No sleep issues, feels rested on waking and sleeps 8 hours nightly. Home Safety/Smoke Alarms: Feels safe in home; uses home alarm. Smoke alarms in place. Living environment: 1-story home; Lives with spouse; no needs for DME; good support system. Seat Belt Safety/Bike Helmet: Wears seat belt.  Cardiac Risk Factors include: advanced age (>66men, >37 women);family history of premature cardiovascular disease;dyslipidemia;hypertension;male gender     Objective:    Today's Vitals   04/05/23 1426  BP: (!) 140/70  Pulse: 75  Temp: 98.3 F (36.8 C)  TempSrc: Temporal  SpO2: 96%  Weight: 195 lb (88.5 kg)  Height: 5\' 10"  (1.778 m)  PainSc: 2   PainLoc: Back   Body mass index is 27.98 kg/m.     04/05/2023    2:42 PM 05/27/2020   10:54 AM  Advanced Directives  Does Patient Have a Medical Advance Directive? Yes Yes  Type of Estate agent of [place];Living will Living will;Healthcare Power of Attorney  Copy of Healthcare Power of Attorney in Chart? No - copy requested No - copy requested    Current Medications (verified) Outpatient Encounter Medications as of 04/05/2023  Medication Sig   aspirin EC 81 MG tablet Take 1 tablet (81 mg total) by mouth daily. Swallow whole.   cetirizine (ZYRTEC) 10 MG tablet Take 1 tablet by mouth daily.   Cholecalciferol 1000 UNITS tablet Take 2,000 Units by mouth daily.   COMIRNATY syringe    Dapsone 7.5 % GEL Apply 1 application topically daily.   fluocinonide cream (LIDEX) 0.05 % Apply 1 application topically daily.   FLUZONE HIGH-DOSE QUADRIVALENT 0.7 ML SUSY    Glucosamine-Chondroit-Vit C-Mn (GLUCOSAMINE CHONDROITIN COMPLX) CAPS Take 1 capsule by mouth  daily.   guaiFENesin (MUCINEX) 600 MG 12 hr tablet Take 2 tablets (1,200 mg total) by mouth 2 (two) times daily. (Patient taking differently: Take 1,200 mg by mouth daily.)   ipratropium (ATROVENT) 0.03 % nasal spray Place 2 sprays into both nostrils every 12 (twelve) hours.   ipratropium-albuterol (DUONEB) 0.5-2.5 (3) MG/3ML SOLN Take 3 mLs by nebulization every 6 (six) hours as needed. (Patient not taking: Reported on 09/07/2022)   loratadine (CLARITIN) 10 MG tablet Take 10 mg by mouth daily.   Multiple Vitamin (MULTIVITAMIN) tablet Take 1 tablet by mouth daily.   mupirocin ointment (BACTROBAN) 2 %    rosuvastatin (CRESTOR) 20 MG tablet TAKE 1 TABLET(20 MG) BY MOUTH DAILY   Saw Palmetto POWD Take by mouth daily.   sodium chloride HYPERTONIC 3 % nebulizer solution Take by nebulization as needed for other. Use this 2 times a day for cough.   terazosin (HYTRIN) 10 MG capsule TAKE 1 CAPSULE BY MOUTH EVERY NIGHT AT BEDTIME. ANNUAL APPT DUE IN AUGUST.   triamcinolone ointment (KENALOG) 0.1 % APPLY EXTERNALLY TO THE AFFECTED AREA THREE TIMES DAILY   vitamin C (ASCORBIC ACID) 500 MG tablet Take 500 mg by mouth daily.   No facility-administered encounter medications on file as of 04/05/2023.    Allergies (verified) Cordran [flurandrenolide] and Lisinopril   History: Past Medical History:  Diagnosis Date   BPH (benign prostatic hypertrophy)    Dr. Isabel Caprice   CAD (coronary artery disease)    HA (headache)    Traction-  Dr. Anne Hahn- had MRI.MRA   HTN (hypertension)    Hyperlipidemia    Past Surgical History:  Procedure Laterality Date   EXCISIONAL HEMORRHOIDECTOMY     LUNG REMOVAL, PARTIAL Right    Family History  Problem Relation Age of Onset   Heart disease Father    Social History   Socioeconomic History   Marital status: Married    Spouse name: Not on file   Number of children: Not on file   Years of education: Not on file   Highest education level: Not on file  Occupational  History   Not on file  Tobacco Use   Smoking status: Former    Current packs/day: 0.00    Average packs/day: 0.1 packs/day for 3.0 years (0.3 ttl pk-yrs)    Types: Cigarettes    Start date: 07/25/1949    Quit date: 07/25/1952    Years since quitting: 70.7   Smokeless tobacco: Never   Tobacco comments:    used to "smoke but not inhale"  Substance and Sexual Activity   Alcohol use: No   Drug use: No   Sexual activity: Yes  Other Topics Concern   Not on file  Social History Narrative   Not on file   Social Determinants of Health   Financial Resource Strain: Low Risk  (04/05/2023)   Overall Financial Resource Strain (CARDIA)    Difficulty of Paying Living Expenses: Not hard at all  Food Insecurity: No Food Insecurity (04/05/2023)   Hunger Vital Sign    Worried About Running Out of Food in the Last Year: Never true    Ran Out of Food in the Last Year: Never true  Transportation Needs: No Transportation Needs (04/05/2023)   PRAPARE - Administrator, Civil Service (Medical): No    Lack of Transportation (Non-Medical): No  Physical Activity: Sufficiently Active (04/05/2023)   Exercise Vital Sign    Days of Exercise per Week: 5 days    Minutes of Exercise per Session: 30 min  Stress: No Stress Concern Present (04/05/2023)   [employer] of Occupational Health - Occupational Stress Questionnaire    Feeling of Stress : Not at all  Social Connections: Socially Integrated (04/05/2023)   Social Connection and Isolation Panel [NHANES]    Frequency of Communication with Friends and Family: More than three times a week    Frequency of Social Gatherings with Friends and Family: Once a week    Attends Religious Services: More than 4 times per year    Active Member of [employer] or Organizations: No    Attends Engineer, structural: More than 4 times per year    Marital Status: Married    Tobacco Counseling Counseling given: Not Answered Tobacco comments: used to "smoke  but not inhale"   Clinical Intake:  Pre-visit preparation completed: Yes  Pain : 0-10 Pain Score: 2  Pain Type: Chronic pain Pain Location: Back     BMI - recorded: 27.98 Nutritional Status: BMI of 19-24  Normal Nutritional Risks: None Diabetes: No  How often do you need to have someone help you when you read instructions, pamphlets, or other written materials from your doctor or pharmacy?: 1 - Never What is the last grade level you completed 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 Alfreda Hammad, LPN.   Activities of Daily Living    04/05/2023    2:29 PM  In your present state of health, do you have  any difficulty performing the following activities:  Hearing? 0  Vision? 0  Difficulty concentrating or making decisions? 0  Walking or climbing stairs? 0  Dressing or bathing? 0  Doing errands, shopping? 0  Preparing Food and eating ? N  Using the Toilet? N  In the past six months, have you accidently leaked urine? N  Do you have problems with loss of bowel control? N  Managing your Medications? N  Managing your Finances? N  Housekeeping or managing your Housekeeping? N    Patient Care Team: Plotnikov, [name], MD as PCP - General Kendrick Fries Brooke Pace, MD (Inactive) as Consulting Physician (Pulmonary Disease) Antony Contras, MD as Consulting Physician (Ophthalmology)  Indicate any recent Medical Services you may have received from other than [facility] providers in the past year (date may be approximate).     Assessment:   This is a routine wellness examination for [place].  Hearing/Vision screen Hearing Screening - Comments:: Patient has hearing difficulty and wears hearing aids. Vision Screening - Comments:: Patient does wear corrective lenses/contacts.  Annual eye exam done by: Antony Contras, MD.   Dietary issues and exercise activities discussed:     Goals Addressed             This Visit's Progress     Client understands the importance of follow-up with providers by attending scheduled visits        Depression Screen    04/05/2023    2:29 PM 09/21/2022    4:09 PM 05/27/2020   10:53 AM 02/06/2020   11:06 AM 01/27/2018    9:58 AM 01/14/2017    8:27 AM 01/14/2016    9:55 AM  PHQ 2/9 Scores  PHQ - 2 Score 0 0 0 0 0 0 0  PHQ- 9 Score 1          Fall Risk    04/05/2023    2:29 PM 09/21/2022    4:09 PM 05/27/2020   10:55 AM 02/06/2020   11:06 AM 01/27/2018    9:58 AM  Fall Risk   Falls in the past year? 0 0 0 0 No  Number falls in past yr: 0 0 0 0   Injury with Fall? 0 0 0 0   Risk for fall due to : No Fall Risks No Fall Risks No Fall Risks    Follow up Falls prevention discussed Falls evaluation completed Falls evaluation completed      MEDICARE RISK AT HOME: Medicare Risk at Home Any stairs in or around the home?: No If so, are there any without handrails?: No Home free of loose throw rugs in walkways, pet beds, electrical cords, etc?: Yes Adequate lighting in your home to reduce risk of falls?: Yes Life alert?: No Use of a cane, walker or w/c?: No Grab bars in the bathroom?: Yes Shower chair or bench in shower?: No Elevated toilet seat or a handicapped toilet?: No  TIMED UP AND GO:  Was the test performed?  Yes  Length of time to ambulate 10 feet: 8 sec Gait steady and fast without use of assistive device    Cognitive Function:        04/05/2023    2:28 PM  6CIT Screen  What Year? 0 points  What month? 0 points  What time? 0 points  Count back from 20 0 points  Months in reverse 0 points  Repeat phrase 0 points  Total Score 0 points    Immunizations Immunization History  Administered Date(s) Administered  Fluad Quad(high Dose 65+) 05/11/2022   Influenza Split 05/29/2011, 05/17/2012   Influenza Whole 07/07/2004, 05/24/2010   Influenza, High Dose Seasonal PF 06/03/2015, 05/19/2016, 05/19/2017, 05/19/2018, 05/10/2019   Influenza-Unspecified 06/16/2013,  04/17/2014   Moderna Sars-Covid-2 Vaccination 09/29/2019, 10/27/2019, 06/26/2020   Pneumococcal Conjugate-13 07/07/2013, 12/10/2020   Pneumococcal Polysaccharide-23 06/07/2009, 01/14/2016, 11/30/2016   Td 06/17/2012   Tdap 11/04/2015   Zoster Recombinant(Shingrix) 02/08/2018, 05/19/2018   Zoster, Live 08/19/2010    TDAP status: Up to date  Flu Vaccine status: Up to date  Pneumococcal vaccine status: Up to date  Covid-19 vaccine status: Completed vaccines  Qualifies for Shingles Vaccine? Yes   Zostavax completed Yes   Shingrix Completed?: Yes  Screening Tests Health Maintenance  Topic Date Due   COVID-19 Vaccine (4 - 2023-24 season) 04/17/2022   INFLUENZA VACCINE  03/18/2023   Medicare Annual Wellness (AWV)  04/04/2024   DTaP/Tdap/Td (3 - Td or Tdap) 11/03/2025   Pneumonia Vaccine 86+ Years old  Completed   Zoster Vaccines- Shingrix  Completed   HPV VACCINES  Aged Out    Health Maintenance  Health Maintenance Due  Topic Date Due   COVID-19 Vaccine (4 - 2023-24 season) 04/17/2022   INFLUENZA VACCINE  03/18/2023    Colorectal cancer screening: No longer required.   Lung Cancer Screening: (Low Dose CT Chest recommended if Age 63-80 years, 20 pack-year currently smoking OR have quit w/in 15years.) does not qualify.   Lung Cancer Screening Referral: no  Additional Screening:  Hepatitis C Screening: does not qualify; Completed: no  Vision Screening: Recommended annual ophthalmology exams for early detection of glaucoma and other disorders of the eye. Is the patient up to date with their annual eye exam?  Yes  Who is the provider or what is the name of the office in which the patient attends annual eye exams? Antony Contras, MD. If pt is not established with a provider, would they like to be referred to a provider to establish care? No .   Dental Screening: Recommended annual dental exams for proper oral hygiene  Diabetic Foot Exam: N/A  Community Resource Referral  / Chronic Care Management: CRR required this visit?  No   CCM required this visit?  No     Plan:     I have personally reviewed and noted the following in the patient's chart:   Medical and social history Use of alcohol, tobacco or illicit drugs  Current medications and supplements including opioid prescriptions. Patient is not currently taking opioid prescriptions. Functional ability and status Nutritional status Physical activity Advanced directives List of other physicians Hospitalizations, surgeries, and ER visits in previous 12 months Vitals Screenings to include cognitive, depression, and falls Referrals and appointments  In addition, I have reviewed and discussed with patient certain preventive protocols, quality metrics, and best practice recommendations. A written personalized care plan for preventive services as well as general preventive health recommendations were provided to patient.     Mickeal Needy, LPN   [id number]/60/[id number]   After Visit Summary: Printed and given to patient at visit.  Nurse Notes: Normal cognitive status assessed by direct observation by this Nurse Health Advisor. No abnormalities found.   Medical screening examination/treatment/procedure(s) were performed by non-physician practitioner and as supervising physician I was immediately available for consultation/collaboration.  I agree with above. Jacinta Shoe, MD

## 2023-04-05 NOTE — Patient Instructions (Signed)
Curtis Jacobs , Thank you for taking time to come for your Medicare Wellness Visit. I appreciate your ongoing commitment to your health goals. Please review the following plan we discussed and let me know if I can assist you in the future.   Referrals/Orders/Follow-Ups/Clinician Recommendations: No  This is a list of the screening recommended for you and due dates:  Health Maintenance  Topic Date Due   COVID-19 Vaccine (4 - 2023-24 season) 04/17/2022   Flu Shot  03/18/2023   Medicare Annual Wellness Visit  04/04/2024   DTaP/Tdap/Td vaccine (3 - Td or Tdap) 11/03/2025   Pneumonia Vaccine  Completed   Zoster (Shingles) Vaccine  Completed   HPV Vaccine  Aged Out    Advanced directives: (Copy Requested) Please bring a copy of your health care power of attorney and living will to the office to be added to your chart at your convenience.  Next Medicare Annual Wellness Visit scheduled for next year: No  Preventive Care 68 Years and Older, Male  Preventive care refers to lifestyle choices and visits with your health care provider that can promote health and wellness. What does preventive care include? A yearly physical exam. This is also called an annual well check. Dental exams once or twice a year. Routine eye exams. Ask your health care provider how often you should have your eyes checked. Personal lifestyle choices, including: Daily care of your teeth and gums. Regular physical activity. Eating a healthy diet. Avoiding tobacco and drug use. Limiting alcohol use. Practicing safe sex. Taking low doses of aspirin every day. Taking vitamin and mineral supplements as recommended by your health care provider. What happens during an annual well check? The services and screenings done by your health care provider during your annual well check will depend on your age, overall health, lifestyle risk factors, and family history of disease. Counseling  Your health care provider may ask you  questions about your: Alcohol use. Tobacco use. Drug use. Emotional well-being. Home and relationship well-being. Sexual activity. Eating habits. History of falls. Memory and ability to understand (cognition). Work and work Astronomer. Screening  You may have the following tests or measurements: Height, weight, and BMI. Blood pressure. Lipid and cholesterol levels. These may be checked every 5 years, or more frequently if you are over 50 years old. Skin check. Lung cancer screening. You may have this screening every year starting at age 54 if you have a 30-pack-year history of smoking and currently smoke or have quit within the past 15 years. Fecal occult blood test (FOBT) of the stool. You may have this test every year starting at age 87. Flexible sigmoidoscopy or colonoscopy. You may have a sigmoidoscopy every 5 years or a colonoscopy every 10 years starting at age 87. Prostate cancer screening. Recommendations will vary depending on your family history and other risks. Hepatitis C blood test. Hepatitis B blood test. Sexually transmitted disease (STD) testing. Diabetes screening. This is done by checking your blood sugar (glucose) after you have not eaten for a while (fasting). You may have this done every 1-3 years. Abdominal aortic aneurysm (AAA) screening. You may need this if you are a current or former smoker. Osteoporosis. You may be screened starting at age 87 if you are at high risk. Talk with your health care provider about your test results, treatment options, and if necessary, the need for more tests. Vaccines  Your health care provider may recommend certain vaccines, such as: Influenza vaccine. This is recommended every year.  Tetanus, diphtheria, and acellular pertussis (Tdap, Td) vaccine. You may need a Td booster every 10 years. Zoster vaccine. You may need this after age 44. Pneumococcal 13-valent conjugate (PCV13) vaccine. One dose is recommended after age  87. Pneumococcal polysaccharide (PPSV23) vaccine. One dose is recommended after age 87. Talk to your health care provider about which screenings and vaccines you need and how often you need them. This information is not intended to replace advice given to you by your health care provider. Make sure you discuss any questions you have with your health care provider. Document Released: 08/30/2015 Document Revised: 04/22/2016 Document Reviewed: 06/04/2015 Elsevier Interactive Patient Education  2017 [employer].  Fall Prevention in the Home Falls can cause injuries. They can happen to people of all ages. There are many things you can do to make your home safe and to help prevent falls. What can I do on the outside of my home? Regularly fix the edges of walkways and driveways and fix any cracks. Remove anything that might make you trip as you walk through a door, such as a raised step or threshold. Trim any bushes or trees on the path to your home. Use bright outdoor lighting. Clear any walking paths of anything that might make someone trip, such as rocks or tools. Regularly check to see if handrails are loose or broken. Make sure that both sides of any steps have handrails. Any raised decks and porches should have guardrails on the edges. Have any leaves, snow, or ice cleared regularly. Use sand or salt on walking paths during winter. Clean up any spills in your garage right away. This includes oil or grease spills. What can I do in the bathroom? Use night lights. Install grab bars by the toilet and in the tub and shower. Do not use towel bars as grab bars. Use non-skid mats or decals in the tub or shower. If you need to sit down in the shower, use a plastic, non-slip stool. Keep the floor dry. Clean up any water that spills on the floor as soon as it happens. Remove soap buildup in the tub or shower regularly. Attach bath mats securely with double-sided non-slip rug tape. Do not have throw  rugs and other things on the floor that can make you trip. What can I do in the bedroom? Use night lights. Make sure that you have a light by your bed that is easy to reach. Do not use any sheets or blankets that are too big for your bed. They should not hang down onto the floor. Have a firm chair that has side arms. You can use this for support while you get dressed. Do not have throw rugs and other things on the floor that can make you trip. What can I do in the kitchen? Clean up any spills right away. Avoid walking on wet floors. Keep items that you use a lot in easy-to-reach places. If you need to reach something above you, use a strong step stool that has a grab bar. Keep electrical cords out of the way. Do not use floor polish or wax that makes floors slippery. If you must use wax, use non-skid floor wax. Do not have throw rugs and other things on the floor that can make you trip. What can I do with my stairs? Do not leave any items on the stairs. Make sure that there are handrails on both sides of the stairs and use them. Fix handrails that are broken or loose.  Make sure that handrails are as long as the stairways. Check any carpeting to make sure that it is firmly attached to the stairs. Fix any carpet that is loose or worn. Avoid having throw rugs at the top or bottom of the stairs. If you do have throw rugs, attach them to the floor with carpet tape. Make sure that you have a light switch at the top of the stairs and the bottom of the stairs. If you do not have them, ask someone to add them for you. What else can I do to help prevent falls? Wear shoes that: Do not have high heels. Have rubber bottoms. Are comfortable and fit you well. Are closed at the toe. Do not wear sandals. If you use a stepladder: Make sure that it is fully opened. Do not climb a closed stepladder. Make sure that both sides of the stepladder are locked into place. Ask someone to hold it for you, if  possible. Clearly mark and make sure that you can see: Any grab bars or handrails. First and last steps. Where the edge of each step is. Use tools that help you move around (mobility aids) if they are needed. These include: Canes. Walkers. Scooters. Crutches. Turn on the lights when you go into a dark area. Replace any light bulbs as soon as they burn out. Set up your furniture so you have a clear path. Avoid moving your furniture around. If any of your floors are uneven, fix them. If there are any pets around you, be aware of where they are. Review your medicines with your doctor. Some medicines can make you feel dizzy. This can increase your chance of falling. Ask your doctor what other things that you can do to help prevent falls. This information is not intended to replace advice given to you by your health care provider. Make sure you discuss any questions you have with your health care provider. Document Released: 05/30/2009 Document Revised: 01/09/2016 Document Reviewed: 09/07/2014 Elsevier Interactive Patient Education  2017 [employer].

## 2023-04-05 NOTE — Assessment & Plan Note (Signed)
B buttock rash patch - 95% healed after 7 years

## 2023-04-05 NOTE — Assessment & Plan Note (Signed)
The patient declined statins Continue with niacin

## 2023-04-05 NOTE — Assessment & Plan Note (Signed)
Derm checks every 6 months

## 2023-04-05 NOTE — Assessment & Plan Note (Signed)
Nl BP at home 

## 2023-04-05 NOTE — Progress Notes (Addendum)
Subjective:  Patient ID: Curtis Jacobs, male    DOB: November 16, 1932  Age: 89 y.o. MRN: [id number]  CC: No chief complaint on file.   HPI Curtis Jacobs presents for Rash, HTN, allergies, h/o melanoma right ear  Outpatient Medications Prior to Visit  Medication Sig Dispense Refill   aspirin EC 81 MG tablet Take 1 tablet (81 mg total) by mouth daily. Swallow whole. 90 tablet 3   cetirizine (ZYRTEC) 10 MG tablet Take 1 tablet by mouth daily.     Cholecalciferol 1000 UNITS tablet Take 2,000 Units by mouth daily.     Dapsone 7.5 % GEL Apply 1 application topically daily. 90 g 1   fluocinonide cream (LIDEX) 0.05 % Apply 1 application topically daily.     Glucosamine-Chondroit-Vit C-Mn (GLUCOSAMINE CHONDROITIN COMPLX) CAPS Take 1 capsule by mouth daily.     guaiFENesin (MUCINEX) 600 MG 12 hr tablet Take 2 tablets (1,200 mg total) by mouth 2 (two) times daily. (Patient taking differently: Take 1,200 mg by mouth daily.)     ipratropium (ATROVENT) 0.03 % nasal spray Place 2 sprays into both nostrils every 12 (twelve) hours. 30 mL 5   ipratropium-albuterol (DUONEB) 0.5-2.5 (3) MG/3ML SOLN Take 3 mLs by nebulization every 6 (six) hours as needed. 120 mL 3   loratadine (CLARITIN) 10 MG tablet Take 10 mg by mouth daily.     Multiple Vitamin (MULTIVITAMIN) tablet Take 1 tablet by mouth daily.     mupirocin ointment (BACTROBAN) 2 %      rosuvastatin (CRESTOR) 20 MG tablet TAKE 1 TABLET(20 MG) BY MOUTH DAILY 90 tablet 3   Saw Palmetto POWD Take by mouth daily.     sodium chloride HYPERTONIC 3 % nebulizer solution Take by nebulization as needed for other. Use this 2 times a day for cough. 750 mL 5   terazosin (HYTRIN) 10 MG capsule TAKE 1 CAPSULE BY MOUTH EVERY NIGHT AT BEDTIME. ANNUAL APPT DUE IN [place]. 90 capsule 3   triamcinolone ointment (KENALOG) 0.1 % APPLY EXTERNALLY TO THE AFFECTED AREA THREE TIMES DAILY 80 g 3   vitamin C (ASCORBIC ACID) 500 MG tablet Take 500 mg by mouth daily.     COMIRNATY  syringe      FLUZONE HIGH-DOSE QUADRIVALENT 0.7 ML SUSY      No facility-administered medications prior to visit.    ROS: Review of Systems  Constitutional:  Negative for appetite change, fatigue and unexpected weight change.  HENT:  Negative for congestion, nosebleeds, sneezing, sore throat and trouble swallowing.   Eyes:  Negative for itching and visual disturbance.  Respiratory:  Negative for cough.   Cardiovascular:  Negative for chest pain, palpitations and leg swelling.  Gastrointestinal:  Negative for abdominal distention, blood in stool, diarrhea and nausea.  Genitourinary:  Negative for frequency and hematuria.  Musculoskeletal:  Negative for back pain, gait problem, joint swelling and neck pain.  Skin:  Positive for rash and wound. Negative for color change.  Neurological:  Negative for dizziness, tremors, speech difficulty and weakness.  Psychiatric/Behavioral:  Negative for agitation, dysphoric mood, sleep disturbance and suicidal ideas. The patient is not nervous/anxious.     Objective:  BP 130/72   Pulse 75   Temp 98.3 F (36.8 C) (Oral)   Resp 12   Ht 5\' 10"  (1.778 m)   Wt 195 lb (88.5 kg)   SpO2 96%   BMI 27.98 kg/m   BP Readings from Last 3 Encounters:  04/05/23 130/72  04/05/23 Marland Kitchen)  140/70  09/21/22 122/68    Wt Readings from Last 3 Encounters:  04/05/23 195 lb (88.5 kg)  04/05/23 195 lb (88.5 kg)  09/21/22 198 lb (89.8 kg)    Physical Exam Constitutional:      General: He is not in acute distress.    Appearance: He is well-developed. He is obese.     Comments: NAD  Eyes:     Conjunctiva/sclera: Conjunctivae normal.     Pupils: Pupils are equal, round, and reactive to light.  Neck:     Thyroid: No thyromegaly.     Vascular: No JVD.  Cardiovascular:     Rate and Rhythm: Normal rate and regular rhythm.     Heart sounds: Normal heart sounds. No murmur heard.    No friction rub. No gallop.  Pulmonary:     Effort: Pulmonary effort is normal. No  respiratory distress.     Breath sounds: Normal breath sounds. No wheezing or rales.  Chest:     Chest wall: No tenderness.  Abdominal:     General: Bowel sounds are normal. There is no distension.     Palpations: Abdomen is soft. There is no mass.     Tenderness: There is no abdominal tenderness. There is no guarding or rebound.  Musculoskeletal:        General: No tenderness. Normal range of motion.     Cervical back: Normal range of motion.  Lymphadenopathy:     Cervical: No cervical adenopathy.  Skin:    General: Skin is warm and dry.     Findings: No rash.  Neurological:     Mental Status: He is alert and oriented to person, place, and time.     Cranial Nerves: No cranial nerve deficit.     Motor: No abnormal muscle tone.     Coordination: Coordination normal.     Gait: Gait normal.     Deep Tendon Reflexes: Reflexes are normal and symmetric.  Psychiatric:        Behavior: Behavior normal.        Thought Content: Thought content normal.        Judgment: Judgment normal.   Rash on the buttocks has almost healed  Lab Results  Component Value Date   WBC 5.6 04/05/2023   HGB 12.2 (L) 04/05/2023   HCT 37.1 (L) 04/05/2023   PLT 113.0 (L) 04/05/2023   GLUCOSE 101 (H) 04/05/2023   CHOL 132 04/05/2023   TRIG 194.0 (H) 04/05/2023   HDL 38.20 (L) 04/05/2023   LDLDIRECT 101.0 01/10/2015   LDLCALC 55 04/05/2023   ALT 12 04/05/2023   AST 14 04/05/2023   NA 143 04/05/2023   K 3.9 04/05/2023   CL 108 04/05/2023   CREATININE 1.15 04/05/2023   BUN 18 04/05/2023   CO2 29 04/05/2023   TSH 1.46 04/05/2023   PSA 3.89 04/05/2023   HGBA1C 5.8 02/02/2019    CT Chest Wo Contrast  Result Date: 08/09/2021 CLINICAL DATA:  Follow-up pulmonary nodule, bronchiectasis EXAM: CT CHEST WITHOUT CONTRAST TECHNIQUE: Multidetector CT imaging of the chest was performed following the standard protocol without IV contrast. COMPARISON:  CT chest dated 02/05/2021 FINDINGS: Cardiovascular: The heart  is normal in size. No pericardial effusion. Ectasia of the ascending thoracic aorta, measuring 4.0 cm. Atherosclerotic calcifications of the aortic root/arch. Three vessel coronary atherosclerosis. Mediastinum/Nodes: No suspicious mediastinal lymphadenopathy. Visualized thyroid is unremarkable. Lungs/Pleura: Biapical pleural-parenchymal scarring. Status post partial right upper lobe wedge resection. Upper lobe predominant bronchiectasis with scattered peribronchovascular  nodularity, similar to the prior. 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 Dominant nodules including a 12 mm partially calcified nodule in the posterior left upper lobe are chronic (dating back multiple years) and benign. No pleural effusion or pneumothorax. Upper Abdomen: Visualized upper abdomen is notable for cholelithiasis and vascular calcifications. Musculoskeletal: Mild degenerative changes of the visualized thoracolumbar spine. IMPRESSION: Status post right upper lobe wedge resection. 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 Additional dominant nodules are chronic/benign. Dedicated follow-up imaging is not required. Ectasia the ascending thoracic aorta, measuring 4.0 cm. Recommend annual imaging followup by CTA or MRA. This recommendation follows 2010 ACCF/AHA/AATS/ACR/ASA/SCA/SCAI/SIR/STS/SVM Guidelines for the Diagnosis and Management of Patients with Thoracic Aortic Disease. Circulation. 2010; 121: H086-V784. Aortic aneurysm NOS (ICD10-I71.9) Aortic Atherosclerosis (ICD10-I70.0). Electronically Signed   By: Charline Bills M.D.   On: 08/09/2021 08:45    Assessment & Plan:   Problem List Items Addressed This Visit     Essential hypertension - Primary    Nl BP at home      BPH (benign prostatic hyperplasia)   Neoplasm of uncertain behavior of skin    B  buttock rash patch - 95% healed after 7 years      Dyslipidemia    The patient declined statins Continue with niacin      Well adult exam   Malignant melanoma of ear (HCC)    Derm checks every 6 months      Microscopic hematuria    New problem.  Urology referral      Relevant Orders   Ambulatory referral to Urology   CBC with Differential/Platelet   Urinalysis   Anemia    New.  Unclear etiology.  Repeat CBC in 3 months      Relevant Orders   CBC with Differential/Platelet   Comprehensive metabolic panel      No orders of the defined types were placed in this encounter.     Follow-up: Return in about 6 months (around 10/06/2023) for Wellness Exam.  Sonda Primes, MD

## 2023-04-06 LAB — URINALYSIS, ROUTINE W REFLEX MICROSCOPIC
Bilirubin Urine: NEGATIVE
Ketones, ur: NEGATIVE
Leukocytes,Ua: NEGATIVE
Nitrite: NEGATIVE
Specific Gravity, Urine: 1.025 (ref 1.000–1.030)
Total Protein, Urine: NEGATIVE
Urine Glucose: NEGATIVE
Urobilinogen, UA: 0.2 (ref 0.0–1.0)
pH: 6 (ref 5.0–8.0)

## 2023-04-06 LAB — LIPID PANEL
Cholesterol: 132 mg/dL (ref 0–200)
HDL: 38.2 mg/dL — ABNORMAL LOW (ref >39.00)
LDL Cholesterol: 55 mg/dL (ref 0–99)
NonHDL: 93.78
Total CHOL/HDL Ratio: 3
Triglycerides: 194 mg/dL — ABNORMAL HIGH (ref 0.0–149.0)
VLDL: 38.8 mg/dL (ref 0.0–40.0)

## 2023-04-06 LAB — COMPREHENSIVE METABOLIC PANEL
ALT: 12 U/L (ref 0–53)
AST: 14 U/L (ref 0–37)
Albumin: 3.9 g/dL (ref 3.5–5.2)
Alkaline Phosphatase: 80 U/L (ref 39–117)
BUN: 18 mg/dL (ref 6–23)
CO2: 29 meq/L (ref 19–32)
Calcium: 9.5 mg/dL (ref 8.4–10.5)
Chloride: 108 meq/L (ref 96–112)
Creatinine, Ser: 1.15 mg/dL (ref 0.40–1.50)
GFR: 57.12 mL/min — ABNORMAL LOW (ref >60.00)
Glucose, Bld: 101 mg/dL — ABNORMAL HIGH (ref 70–99)
Potassium: 3.9 meq/L (ref 3.5–5.1)
Sodium: 143 meq/L (ref 135–145)
Total Bilirubin: 0.5 mg/dL (ref 0.2–1.2)
Total Protein: 6.4 g/dL (ref 6.0–8.3)

## 2023-04-11 DIAGNOSIS — D61818 Other pancytopenia: Secondary | ICD-10-CM | POA: Insufficient documentation

## 2023-04-11 DIAGNOSIS — D649 Anemia, unspecified: Secondary | ICD-10-CM | POA: Insufficient documentation

## 2023-04-11 DIAGNOSIS — R3129 Other microscopic hematuria: Secondary | ICD-10-CM | POA: Insufficient documentation

## 2023-04-11 NOTE — Assessment & Plan Note (Signed)
New problem.  Urology referral

## 2023-04-11 NOTE — Assessment & Plan Note (Signed)
New.  Unclear etiology.  Repeat CBC in 3 months

## 2023-04-11 NOTE — Addendum Note (Signed)
Addended by: Tresa Garter on: 04/11/2023 04:21 PM   Modules accepted: Orders, Level of Service

## 2023-05-14 ENCOUNTER — Other Ambulatory Visit: Payer: Self-pay

## 2023-05-14 DIAGNOSIS — I10 Essential (primary) hypertension: Secondary | ICD-10-CM

## 2023-05-14 DIAGNOSIS — E7800 Pure hypercholesterolemia, unspecified: Secondary | ICD-10-CM

## 2023-05-14 DIAGNOSIS — I2510 Atherosclerotic heart disease of native coronary artery without angina pectoris: Secondary | ICD-10-CM

## 2023-05-14 MED ORDER — ROSUVASTATIN CALCIUM 20 MG PO TABS
20.0000 mg | ORAL_TABLET | Freq: Every day | ORAL | 0 refills | Status: DC
Start: 2023-05-14 — End: 2023-06-18

## 2023-05-20 DIAGNOSIS — M65332 Trigger finger, left middle finger: Secondary | ICD-10-CM | POA: Diagnosis not present

## 2023-05-20 DIAGNOSIS — M65312 Trigger thumb, left thumb: Secondary | ICD-10-CM | POA: Diagnosis not present

## 2023-05-20 DIAGNOSIS — M65311 Trigger thumb, right thumb: Secondary | ICD-10-CM | POA: Diagnosis not present

## 2023-05-21 DIAGNOSIS — M533 Sacrococcygeal disorders, not elsewhere classified: Secondary | ICD-10-CM | POA: Diagnosis not present

## 2023-05-21 DIAGNOSIS — M791 Myalgia, unspecified site: Secondary | ICD-10-CM | POA: Diagnosis not present

## 2023-06-11 DIAGNOSIS — Z961 Presence of intraocular lens: Secondary | ICD-10-CM | POA: Diagnosis not present

## 2023-06-11 DIAGNOSIS — H52203 Unspecified astigmatism, bilateral: Secondary | ICD-10-CM | POA: Diagnosis not present

## 2023-06-18 ENCOUNTER — Other Ambulatory Visit: Payer: Self-pay | Admitting: Cardiology

## 2023-06-18 DIAGNOSIS — I2510 Atherosclerotic heart disease of native coronary artery without angina pectoris: Secondary | ICD-10-CM

## 2023-06-18 DIAGNOSIS — E7800 Pure hypercholesterolemia, unspecified: Secondary | ICD-10-CM

## 2023-06-18 DIAGNOSIS — I10 Essential (primary) hypertension: Secondary | ICD-10-CM

## 2023-06-21 ENCOUNTER — Encounter: Payer: Self-pay | Admitting: Internal Medicine

## 2023-06-21 ENCOUNTER — Ambulatory Visit (INDEPENDENT_AMBULATORY_CARE_PROVIDER_SITE_OTHER): Payer: Medicare Other | Admitting: Internal Medicine

## 2023-06-21 VITALS — BP 138/78 | HR 70 | Temp 98.3°F | Ht 70.0 in | Wt 193.0 lb

## 2023-06-21 DIAGNOSIS — I10 Essential (primary) hypertension: Secondary | ICD-10-CM

## 2023-06-21 DIAGNOSIS — M5442 Lumbago with sciatica, left side: Secondary | ICD-10-CM

## 2023-06-21 DIAGNOSIS — D649 Anemia, unspecified: Secondary | ICD-10-CM

## 2023-06-21 DIAGNOSIS — L309 Dermatitis, unspecified: Secondary | ICD-10-CM

## 2023-06-21 DIAGNOSIS — Z0000 Encounter for general adult medical examination without abnormal findings: Secondary | ICD-10-CM

## 2023-06-21 DIAGNOSIS — E785 Hyperlipidemia, unspecified: Secondary | ICD-10-CM

## 2023-06-21 DIAGNOSIS — Z23 Encounter for immunization: Secondary | ICD-10-CM | POA: Diagnosis not present

## 2023-06-21 DIAGNOSIS — G8929 Other chronic pain: Secondary | ICD-10-CM

## 2023-06-21 DIAGNOSIS — M5441 Lumbago with sciatica, right side: Secondary | ICD-10-CM

## 2023-06-21 DIAGNOSIS — R3129 Other microscopic hematuria: Secondary | ICD-10-CM | POA: Diagnosis not present

## 2023-06-21 LAB — COMPREHENSIVE METABOLIC PANEL
ALT: 14 U/L (ref 0–53)
AST: 14 U/L (ref 0–37)
Albumin: 3.9 g/dL (ref 3.5–5.2)
Alkaline Phosphatase: 77 U/L (ref 39–117)
BUN: 18 mg/dL (ref 6–23)
CO2: 28 meq/L (ref 19–32)
Calcium: 9.5 mg/dL (ref 8.4–10.5)
Chloride: 109 meq/L (ref 96–112)
Creatinine, Ser: 1.13 mg/dL (ref 0.40–1.50)
GFR: 58.25 mL/min — ABNORMAL LOW (ref >60.00)
Glucose, Bld: 98 mg/dL (ref 70–99)
Potassium: 4.1 meq/L (ref 3.5–5.1)
Sodium: 142 meq/L (ref 135–145)
Total Bilirubin: 0.5 mg/dL (ref 0.2–1.2)
Total Protein: 6.6 g/dL (ref 6.0–8.3)

## 2023-06-21 LAB — URINALYSIS, ROUTINE W REFLEX MICROSCOPIC
Bilirubin Urine: NEGATIVE
Ketones, ur: NEGATIVE
Leukocytes,Ua: NEGATIVE
Nitrite: NEGATIVE
Specific Gravity, Urine: 1.02 (ref 1.000–1.030)
Total Protein, Urine: NEGATIVE
Urine Glucose: NEGATIVE
Urobilinogen, UA: 0.2 (ref 0.0–1.0)
pH: 6 (ref 5.0–8.0)

## 2023-06-21 LAB — CBC WITH DIFFERENTIAL/PLATELET
Basophils Absolute: 0 10*3/uL (ref 0.0–0.1)
Basophils Relative: 0.7 % (ref 0.0–3.0)
Eosinophils Absolute: 0 10*3/uL (ref 0.0–0.7)
Eosinophils Relative: 1 % (ref 0.0–5.0)
HCT: 36.1 % — ABNORMAL LOW (ref 39.0–52.0)
Hemoglobin: 11.8 g/dL — ABNORMAL LOW (ref 13.0–17.0)
Lymphocytes Relative: 36.7 % (ref 12.0–46.0)
Lymphs Abs: 1.5 10*3/uL (ref 0.7–4.0)
MCHC: 32.6 g/dL (ref 30.0–36.0)
MCV: 95.9 fL (ref 78.0–100.0)
Monocytes Absolute: 0.4 10*3/uL (ref 0.1–1.0)
Monocytes Relative: 9.9 % (ref 3.0–12.0)
Neutro Abs: 2.2 10*3/uL (ref 1.4–7.7)
Neutrophils Relative %: 51.7 % (ref 43.0–77.0)
Platelets: 108 10*3/uL — ABNORMAL LOW (ref 150.0–400.0)
RBC: 3.77 Mil/uL — ABNORMAL LOW (ref 4.22–5.81)
RDW: 14.5 % (ref 11.5–15.5)
WBC: 4.2 10*3/uL (ref 4.0–10.5)

## 2023-06-21 NOTE — Progress Notes (Signed)
Subjective:  Patient ID: Curtis Jacobs, male    DOB: [birth year]  Age: 89 y.o. MRN: [id number]  CC: Medical Management of Chronic Issues (3 MNTH F/U)   HPI Curtis Jacobs presents for rash - better on Calamine, microscopic hematuria, abn CBC    Outpatient Medications Prior to Visit  Medication Sig Dispense Refill   aspirin EC 81 MG tablet Take 1 tablet (81 mg total) by mouth daily. Swallow whole. 90 tablet 3   cetirizine (ZYRTEC) 10 MG tablet Take 1 tablet by mouth daily.     Cholecalciferol 1000 UNITS tablet Take 2,000 Units by mouth daily.     Dapsone 7.5 % GEL Apply 1 application topically daily. 90 g 1   fluocinonide cream (LIDEX) 0.05 % Apply 1 application topically daily.     Glucosamine-Chondroit-Vit C-Mn (GLUCOSAMINE CHONDROITIN COMPLX) CAPS Take 1 capsule by mouth daily.     guaiFENesin (MUCINEX) 600 MG 12 hr tablet Take 2 tablets (1,200 mg total) by mouth 2 (two) times daily. (Patient taking differently: Take 1,200 mg by mouth daily.)     ipratropium (ATROVENT) 0.03 % nasal spray Place 2 sprays into both nostrils every 12 (twelve) hours. 30 mL 5   ipratropium-albuterol (DUONEB) 0.5-2.5 (3) MG/3ML SOLN Take 3 mLs by nebulization every 6 (six) hours as needed. 120 mL 3   loratadine (CLARITIN) 10 MG tablet Take 10 mg by mouth daily.     Multiple Vitamin (MULTIVITAMIN) tablet Take 1 tablet by mouth daily.     mupirocin ointment (BACTROBAN) 2 %      rosuvastatin (CRESTOR) 20 MG tablet TAKE 1 TABLET(20 MG) BY MOUTH DAILY 30 tablet 0   Saw Palmetto POWD Take by mouth daily.     sodium chloride HYPERTONIC 3 % nebulizer solution Take by nebulization as needed for other. Use this 2 times a day for cough. 750 mL 5   terazosin (HYTRIN) 10 MG capsule TAKE 1 CAPSULE BY MOUTH EVERY NIGHT AT BEDTIME. ANNUAL APPT DUE IN [place]. 90 capsule 3   triamcinolone ointment (KENALOG) 0.1 % APPLY EXTERNALLY TO THE AFFECTED AREA THREE TIMES DAILY 80 g 3   vitamin C (ASCORBIC ACID) 500 MG tablet Take 500  mg by mouth daily.     No facility-administered medications prior to visit.    ROS: Review of Systems  Constitutional:  Negative for appetite change, fatigue and unexpected weight change.  HENT:  Negative for congestion, nosebleeds, sneezing, sore throat and trouble swallowing.   Eyes:  Negative for itching and visual disturbance.  Respiratory:  Positive for cough.   Cardiovascular:  Negative for chest pain, palpitations and leg swelling.  Gastrointestinal:  Negative for abdominal distention, blood in stool, diarrhea and nausea.  Genitourinary:  Negative for frequency and hematuria.  Musculoskeletal:  Positive for back pain. Negative for gait problem, joint swelling and neck pain.  Skin:  Positive for rash.  Neurological:  Negative for dizziness, tremors, speech difficulty and weakness.  Psychiatric/Behavioral:  Negative for agitation, dysphoric mood and sleep disturbance. The patient is not nervous/anxious.     Objective:  BP 138/78 (BP Location: Right Arm, Patient Position: Sitting, Cuff Size: Normal)   Pulse 70   Temp 98.3 F (36.8 C) (Oral)   Ht 5\' 10"  (1.778 m)   Wt 193 lb (87.5 kg)   SpO2 96%   BMI 27.69 kg/m   BP Readings from Last 3 Encounters:  06/21/23 138/78  04/05/23 130/72  04/05/23 (!) 140/70    Wt Readings from Last  3 Encounters:  06/21/23 193 lb (87.5 kg)  04/05/23 195 lb (88.5 kg)  04/05/23 195 lb (88.5 kg)    Physical Exam Constitutional:      General: He is not in acute distress.    Appearance: Normal appearance. He is well-developed.     Comments: NAD  Eyes:     Conjunctiva/sclera: Conjunctivae normal.     Pupils: Pupils are equal, round, and reactive to light.  Neck:     Thyroid: No thyromegaly.     Vascular: No JVD.  Cardiovascular:     Rate and Rhythm: Normal rate and regular rhythm.     Heart sounds: Murmur heard.     No friction rub. No gallop.  Pulmonary:     Effort: Pulmonary effort is normal. No respiratory distress.     Breath  sounds: Normal breath sounds. No wheezing or rales.  Chest:     Chest wall: No tenderness.  Abdominal:     General: Bowel sounds are normal. There is no distension.     Palpations: Abdomen is soft. There is no mass.     Tenderness: There is no abdominal tenderness. There is no guarding or rebound.  Musculoskeletal:        General: No tenderness. Normal range of motion.     Cervical back: Normal range of motion.  Lymphadenopathy:     Cervical: No cervical adenopathy.  Skin:    General: Skin is warm and dry.     Findings: No rash.  Neurological:     Mental Status: He is alert and oriented to person, place, and time.     Cranial Nerves: No cranial nerve deficit.     Motor: No abnormal muscle tone.     Coordination: Coordination normal.     Gait: Gait normal.     Deep Tendon Reflexes: Reflexes are normal and symmetric.  Psychiatric:        Behavior: Behavior normal.        Thought Content: Thought content normal.        Judgment: Judgment normal.     Lab Results  Component Value Date   WBC 5.6 04/05/2023   HGB 12.2 (L) 04/05/2023   HCT 37.1 (L) 04/05/2023   PLT 113.0 (L) 04/05/2023   GLUCOSE 101 (H) 04/05/2023   CHOL 132 04/05/2023   TRIG 194.0 (H) 04/05/2023   HDL 38.20 (L) 04/05/2023   LDLDIRECT 101.0 01/10/2015   LDLCALC 55 04/05/2023   ALT 12 04/05/2023   AST 14 04/05/2023   NA 143 04/05/2023   K 3.9 04/05/2023   CL 108 04/05/2023   CREATININE 1.15 04/05/2023   BUN 18 04/05/2023   CO2 29 04/05/2023   TSH 1.46 04/05/2023   PSA 3.89 04/05/2023   HGBA1C 5.8 02/02/2019    CT Chest Wo Contrast  Result Date: 08/09/2021 CLINICAL DATA:  Follow-up pulmonary nodule, bronchiectasis EXAM: CT CHEST WITHOUT CONTRAST TECHNIQUE: Multidetector CT imaging of the chest was performed following the standard protocol without IV contrast. COMPARISON:  CT chest dated 02/05/2021 FINDINGS: Cardiovascular: The heart is normal in size. No pericardial effusion. Ectasia of the ascending  thoracic aorta, measuring 4.0 cm. Atherosclerotic calcifications of the aortic root/arch. Three vessel coronary atherosclerosis. Mediastinum/Nodes: No suspicious mediastinal lymphadenopathy. Visualized thyroid is unremarkable. Lungs/Pleura: Biapical pleural-parenchymal scarring. Status post partial right upper lobe wedge resection. Upper lobe predominant bronchiectasis with scattered peribronchovascular nodularity, similar to the prior. 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 Dominant nodules including a 12 mm  partially calcified nodule in the posterior left upper lobe are chronic (dating back multiple years) and benign. No pleural effusion or pneumothorax. Upper Abdomen: Visualized upper abdomen is notable for cholelithiasis and vascular calcifications. Musculoskeletal: Mild degenerative changes of the visualized thoracolumbar spine. IMPRESSION: Status post right upper lobe wedge resection. 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 Additional dominant nodules are chronic/benign. Dedicated follow-up imaging is not required. Ectasia the ascending thoracic aorta, measuring 4.0 cm. Recommend annual imaging followup by CTA or MRA. This recommendation follows 2010 ACCF/AHA/AATS/ACR/ASA/SCA/SCAI/SIR/STS/SVM Guidelines for the Diagnosis and Management of Patients with Thoracic Aortic Disease. Circulation. 2010; 121: G387-F643. Aortic aneurysm NOS (ICD10-I71.9) Aortic Atherosclerosis (ICD10-I70.0). Electronically Signed   By: Charline Bills M.D.   On: 08/09/2021 08:45    Assessment & Plan:   Problem List Items Addressed This Visit     Essential hypertension - Primary    Nl BP at home      Dyslipidemia   Relevant Orders   TSH   Lipid panel   Well adult exam   Relevant Orders   TSH   Urinalysis   CBC with Differential/Platelet    Lipid panel   PSA   Comprehensive metabolic panel   Low back pain    Doing well  - chronic pain      Microscopic hematuria    Urology appt is pending      Anemia    New.  Unclear etiology.  Repeat CBC in 3 months      Relevant Orders   CBC with Differential/Platelet   Comprehensive metabolic panel      No orders of the defined types were placed in this encounter.     Follow-up: Return in about 6 months (around 12/19/2023) for Wellness Exam.  Sonda Primes, MD

## 2023-06-21 NOTE — Assessment & Plan Note (Signed)
The patient declined statins Continue with Crestor

## 2023-06-21 NOTE — Assessment & Plan Note (Signed)
New.  Unclear etiology.  Repeat CBC in 3 months

## 2023-06-21 NOTE — Assessment & Plan Note (Signed)
Doing well  - chronic pain

## 2023-06-21 NOTE — Assessment & Plan Note (Addendum)
Urology appt is pending

## 2023-06-21 NOTE — Assessment & Plan Note (Signed)
Nl BP at home 

## 2023-06-21 NOTE — Assessment & Plan Note (Signed)
Rash - better on Calamine

## 2023-06-29 DIAGNOSIS — D1801 Hemangioma of skin and subcutaneous tissue: Secondary | ICD-10-CM | POA: Diagnosis not present

## 2023-06-29 DIAGNOSIS — L821 Other seborrheic keratosis: Secondary | ICD-10-CM | POA: Diagnosis not present

## 2023-06-29 DIAGNOSIS — L812 Freckles: Secondary | ICD-10-CM | POA: Diagnosis not present

## 2023-06-29 DIAGNOSIS — L218 Other seborrheic dermatitis: Secondary | ICD-10-CM | POA: Diagnosis not present

## 2023-06-29 DIAGNOSIS — Z85828 Personal history of other malignant neoplasm of skin: Secondary | ICD-10-CM | POA: Diagnosis not present

## 2023-06-29 DIAGNOSIS — Z85820 Personal history of malignant melanoma of skin: Secondary | ICD-10-CM | POA: Diagnosis not present

## 2023-06-29 DIAGNOSIS — D225 Melanocytic nevi of trunk: Secondary | ICD-10-CM | POA: Diagnosis not present

## 2023-06-29 DIAGNOSIS — D692 Other nonthrombocytopenic purpura: Secondary | ICD-10-CM | POA: Diagnosis not present

## 2023-07-01 NOTE — Progress Notes (Unsigned)
Cardiology Clinic Note   Patient Name: Curtis Jacobs Date of Encounter: 07/05/2023  Primary Care Provider:  Tresa Garter, MD Primary Cardiologist:  None  Patient Profile    Curtis Jacobs 89 year old male presents to the clinic today for follow-up evaluation of his coronary artery disease and hypertension.  Past Medical History    Past Medical History:  Diagnosis Date   BPH (benign prostatic hypertrophy)    Dr. Isabel Caprice   CAD (coronary artery disease)    HA (headache)    Traction- Dr. Anne Hahn- had MRI.MRA   HTN (hypertension)    Hyperlipidemia    Past Surgical History:  Procedure Laterality Date   EXCISIONAL HEMORRHOIDECTOMY     LUNG REMOVAL, PARTIAL Right     Allergies  Allergies  Allergen Reactions   Cordran [Flurandrenolide] Rash    rash   Lisinopril Cough    REACTION: cough    History of Present Illness    Curtis Jacobs has a PMH of coronary artery disease, HLD, HTN, and BPH.  Carotid Dopplers 12/20 showed no stenosis.  Chest CT 6/22 showed bronchiectasis, pulmonary nodules, and three-vessel coronary atherosclerosis with aortic atherosclerosis as well.  Echocardiogram 10/22 showed normal LV function, mild LVH, G1 DD, mild right ventricular enlargement, mild mitral regurgitation, mild aortic insufficiency, dilated ascending aorta measuring 44 mm and dilated aortic root measuring 45 mm.  Chest CT 12/22 showed previous right upper lobe resection, bronchiectasis and ectatic ascending aorta measuring 40 mm.  He was seen in follow-up by Dr. Jens Som on 11/18/2021.  During that time he continued to have dyspnea on exertion which he attributed to his bronchiectasis.  He denied orthopnea and PND.  He denied lower extremity swelling, chest pain and syncope.  He contacted the nurse triage line and reported that he was not feeling well.  He noted that he also had elevated blood pressure.  He presents to the clinic today for follow-up evaluation and states he has noticed  over the past several weeks he has had an increase in his blood pressure.  He notes at home that his blood pressure is routinely in the 140s over 80s.  In the clinic today it is initially 146/80 and on recheck it is 142/80.  He continues to have back discomfort related to his lumbar stenosis.  This limits his physical activity.  He takes Tylenol for this.  We reviewed secondary causes of hypertension.  I will start amlodipine 2.5 mg daily, give salty 6 diet sheet and plan follow-up in 1-2 months..  Today he denies chest pain, shortness of breath, lower extremity edema, fatigue, palpitations, melena, hematuria, hemoptysis, diaphoresis, weakness, presyncope, syncope, orthopnea, and PND.   Home Medications    Prior to Admission medications   Medication Sig Start Date End Date Taking? Authorizing Provider  aspirin EC 81 MG tablet Take 1 tablet (81 mg total) by mouth daily. Swallow whole. 11/18/21   Lewayne Bunting, MD  cetirizine (ZYRTEC) 10 MG tablet Take 1 tablet by mouth daily.    [provider]  Cholecalciferol 1000 UNITS tablet Take 2,000 Units by mouth daily.    [provider]  Dapsone 7.5 % GEL Apply 1 application topically daily. 12/10/20   Plotnikov, [name], MD  fluocinonide cream (LIDEX) 0.05 % Apply 1 application topically daily. 04/30/16   [provider]  Glucosamine-Chondroit-Vit C-Mn (GLUCOSAMINE CHONDROITIN COMPLX) CAPS Take 1 capsule by mouth daily.    [provider]  guaiFENesin (MUCINEX) 600 MG 12  hr tablet Take 2 tablets (1,200 mg total) by mouth 2 (two) times daily. Patient taking differently: Take 1,200 mg by mouth daily. 08/05/15   Lupita Leash, MD  ipratropium (ATROVENT) 0.03 % nasal spray Place 2 sprays into both nostrils every 12 (twelve) hours. 09/07/22   Glenford Bayley, NP  ipratropium-albuterol (DUONEB) 0.5-2.5 (3) MG/3ML SOLN Take 3 mLs by nebulization every 6 (six) hours as needed. 12/10/20   Plotnikov, [name], MD   loratadine (CLARITIN) 10 MG tablet Take 10 mg by mouth daily.    [provider]  Multiple Vitamin (MULTIVITAMIN) tablet Take 1 tablet by mouth daily.    [provider]  mupirocin ointment (BACTROBAN) 2 %  09/27/19   [provider]  rosuvastatin (CRESTOR) 20 MG tablet TAKE 1 TABLET(20 MG) BY MOUTH DAILY 06/18/23   Jens Som Madolyn Frieze, MD  Saw Palmetto POWD Take by mouth daily.    [provider]  sodium chloride HYPERTONIC 3 % nebulizer solution Take by nebulization as needed for other. Use this 2 times a day for cough. 09/03/21   Icard, Rachel Bo, DO  terazosin (HYTRIN) 10 MG capsule TAKE 1 CAPSULE BY MOUTH EVERY NIGHT AT BEDTIME. ANNUAL APPT DUE IN AUGUST. 02/26/23   Plotnikov, [name], MD  triamcinolone ointment (KENALOG) 0.1 % APPLY EXTERNALLY TO THE AFFECTED AREA THREE TIMES DAILY 12/08/19   Plotnikov, [name], MD  vitamin C (ASCORBIC ACID) 500 MG tablet Take 500 mg by mouth daily.    [provider]    Family History    Family History  Problem Relation Age of Onset   Heart disease Father    He indicated that his mother is deceased. He indicated that his father is deceased.  Social History    Social History   Socioeconomic History   Marital status: Married    Spouse name: Not on file   Number of children: Not on file   Years of education: Not on file   Highest education level: Not on file  Occupational History   Not on file  Tobacco Use   Smoking status: Former    Current packs/day: 0.00    Average packs/day: 0.1 packs/day for 3.0 years (0.3 ttl pk-yrs)    Types: Cigarettes    Start date: 07/25/1949    Quit date: 07/25/1952    Years since quitting: 70.9   Smokeless tobacco: Never   Tobacco comments:    used to "smoke but not inhale"  Substance and Sexual Activity   Alcohol use: No   Drug use: No   Sexual activity: Yes  Other Topics Concern   Not on file  Social History Narrative   Not on file   Social Determinants of  Health   Financial Resource Strain: Low Risk  (04/05/2023)   Overall Financial Resource Strain (CARDIA)    Difficulty of Paying Living Expenses: Not hard at all  Food Insecurity: No Food Insecurity (04/05/2023)   Hunger Vital Sign    Worried About Running Out of Food in the Last Year: Never true    Ran Out of Food in the Last Year: Never true  Transportation Needs: No Transportation Needs (04/05/2023)   PRAPARE - Administrator, Civil Service (Medical): No    Lack of Transportation (Non-Medical): No  Physical Activity: Sufficiently Active (04/05/2023)   Exercise Vital Sign    Days of Exercise per Week: 5 days    Minutes of Exercise per Session: 30 min  Stress: No  Stress Concern Present (04/05/2023)   [employer] of Occupational Health - Occupational Stress Questionnaire    Feeling of Stress : Not at all  Social Connections: Socially Integrated (04/05/2023)   Social Connection and Isolation Panel [NHANES]    Frequency of Communication with Friends and Family: More than three times a week    Frequency of Social Gatherings with Friends and Family: Once a week    Attends Religious Services: More than 4 times per year    Active Member of [employer] or Organizations: No    Attends Engineer, structural: More than 4 times per year    Marital Status: Married  Catering manager Violence: Not At Risk (04/05/2023)   Humiliation, Afraid, Rape, and Kick questionnaire    Fear of Current or Ex-Partner: No    Emotionally Abused: No    Physically Abused: No    Sexually Abused: No     Review of Systems    General:  No chills, fever, night sweats or weight changes.  Cardiovascular:  No chest pain, dyspnea on exertion, edema, orthopnea, palpitations, paroxysmal nocturnal dyspnea. Dermatological: No rash, lesions/masses Respiratory: No cough, dyspnea Urologic: No hematuria, dysuria Abdominal:   No nausea, vomiting, diarrhea, bright red blood per rectum, melena, or  hematemesis Neurologic:  No visual changes, wkns, changes in mental status. All other systems reviewed and are otherwise negative except as noted above.  Physical Exam    VS:  Pulse 67   Ht 5\' 10"  (1.778 m)   Wt 195 lb (88.5 kg)   BMI 27.98 kg/m  , BMI Body mass index is 27.98 kg/m. GEN: Well nourished, well developed, in no acute distress. HEENT: normal. Neck: Supple, no JVD, carotid bruits, or masses. Cardiac: RRR, no murmurs, rubs, or gallops. No clubbing, cyanosis, edema.  Radials/DP/PT 2+ and equal bilaterally.  Respiratory:  Respirations regular and unlabored, clear to auscultation bilaterally. GI: Soft, nontender, nondistended, BS + x 4. MS: no deformity or atrophy. Skin: warm and dry, no rash. Neuro:  Strength and sensation are intact. Psych: Normal affect.  Accessory Clinical Findings    Recent Labs: 04/05/2023: TSH 1.46 06/21/2023: ALT 14; BUN 18; Creatinine, Ser 1.13; Hemoglobin 11.8; Platelets 108.0; Potassium 4.1; Sodium 142   Recent Lipid Panel    Component Value Date/Time   CHOL 132 04/05/2023 1608   CHOL 142 07/15/2021 0940   TRIG 194.0 (H) 04/05/2023 1608   HDL 38.20 (L) 04/05/2023 1608   HDL 42 07/15/2021 0940   CHOLHDL 3 04/05/2023 1608   VLDL 38.8 04/05/2023 1608   LDLCALC 55 04/05/2023 1608   LDLCALC 74 07/15/2021 0940   LDLDIRECT 101.0 01/10/2015 1117       ECG personally reviewed by me today- EKG Interpretation Date/Time:  Monday July 05 2023 15:43:35 EST Ventricular Rate:  67 PR Interval:  182 QRS Duration:  166 QT Interval:  452 QTC Calculation: 477 R Axis:   -67  Text Interpretation: Normal sinus rhythm Right bundle branch block Left anterior fascicular block Bifascicular block Minimal voltage criteria for LVH, may be normal variant ( R in aVL ) Reconfirmed by Edd Fabian [phone]) on 07/05/2023 3:53:25 PM    Echocardiogram 06/06/2021  IMPRESSIONS     1. Left ventricular ejection fraction by 3D volume is 65 %. The left   ventricle has normal function. The left ventricle has no regional wall  motion abnormalities. The left ventricular internal cavity size was mildly  dilated. Left ventricular diastolic  parameters are consistent with Grade I diastolic  dysfunction (impaired  relaxation).   2. Right ventricular systolic function is normal. The right ventricular  size is mildly enlarged.   3. The mitral valve is normal in structure. Mild mitral valve  regurgitation. No evidence of mitral stenosis.   4. The aortic valve is tricuspid. Aortic valve regurgitation is mild. No  aortic stenosis is present.   5. Aortic dilatation noted. There is mild dilatation of the ascending  aorta, measuring 44 mm. There is mild dilatation of the aortic root,  measuring 45 mm.   6. The inferior vena cava is normal in size with greater than 50%  respiratory variability, suggesting right atrial pressure of 3 mmHg.   FINDINGS   Left Ventricle: Left ventricular ejection fraction by 3D volume is 65 %.  The left ventricle has normal function. The left ventricle has no regional  wall motion abnormalities. The left ventricular internal cavity size was  mildly dilated. There is no left   ventricular hypertrophy. Left ventricular diastolic parameters are  consistent with Grade I diastolic dysfunction (impaired relaxation).   Right Ventricle: The right ventricular size is mildly enlarged. No  increase in right ventricular wall thickness. Right ventricular systolic  function is normal.   Left Atrium: Left atrial size was normal in size.   Right Atrium: Right atrial size was normal in size.   Pericardium: There is no evidence of pericardial effusion.   Mitral Valve: The mitral valve is normal in structure. Mild mitral valve  regurgitation. No evidence of mitral valve stenosis.   Tricuspid Valve: The tricuspid valve is normal in structure. Tricuspid  valve regurgitation is not demonstrated. No evidence of tricuspid  stenosis.    Aortic Valve: The aortic valve is tricuspid. Aortic valve regurgitation is  mild. Aortic regurgitation PHT measures 673 msec. No aortic stenosis is  present.   Pulmonic Valve: The pulmonic valve was normal in structure. Pulmonic valve  regurgitation is mild. No evidence of pulmonic stenosis.   Aorta: Aortic dilatation noted. There is mild dilatation of the ascending  aorta, measuring 44 mm. There is mild dilatation of the aortic root,  measuring 45 mm.   Venous: The inferior vena cava is normal in size with greater than 50%  respiratory variability, suggesting right atrial pressure of 3 mmHg.   IAS/Shunts: No atrial level shunt detected by color flow Doppler.      Assessment & Plan   1.  Essential hypertension-BP today 146/80.  Blood pressure recheck 142/80.  Start amlodipline 2.5 mg daily Heart healthy low-sodium diet- salty 6 diet sheet Maintain blood pressure log Maintain physical activity  Coronary artery disease-denies chest pain.  Noted to have three-vessel coronary atherosclerosis on chest CT.  Denies exertional chest pain. Continue aspirin, rosuvastatin Maintain physical activity   Hyperlipidemia-LDL 55 on 04/05/23. High-fiber diet Continue current medical therapy  Disposition: Follow-up with Dr. Jens Som or me in 2 months.   Thomasene Ripple. Navy Belay NP-C     07/05/2023, 3:53 PM [facility] [address] Office [phone] Fax [phone]    I spent 14 minutes examining this patient, reviewing medications, and using patient centered shared decision making involving her cardiac care.   I spent greater than 20 minutes reviewing her past medical history,  medications, and prior cardiac tests.

## 2023-07-05 ENCOUNTER — Encounter: Payer: Self-pay | Admitting: General Practice

## 2023-07-05 ENCOUNTER — Ambulatory Visit: Payer: Medicare Other | Attending: General Practice | Admitting: General Practice

## 2023-07-05 VITALS — BP 142/80 | HR 67 | Ht 70.0 in | Wt 195.0 lb

## 2023-07-05 DIAGNOSIS — I2510 Atherosclerotic heart disease of native coronary artery without angina pectoris: Secondary | ICD-10-CM

## 2023-07-05 DIAGNOSIS — I10 Essential (primary) hypertension: Secondary | ICD-10-CM

## 2023-07-05 DIAGNOSIS — E7800 Pure hypercholesterolemia, unspecified: Secondary | ICD-10-CM | POA: Diagnosis not present

## 2023-07-05 MED ORDER — AMLODIPINE BESYLATE 2.5 MG PO TABS: 2.5000 mg | ORAL_TABLET | Freq: Every day | ORAL | 3 refills | Status: DC

## 2023-07-05 NOTE — Patient Instructions (Signed)
Medication Instructions:  Start Amlodipine 2.5 mg daily  *If you need a refill on your cardiac medications before your next appointment, please call your pharmacy*   Lab Work: No labs    Testing/Procedures: None    Follow-Up: At [facility], you and your health needs are our priority.  As part of our continuing mission to provide you with exceptional heart care, we have created designated Provider Care Teams.  These Care Teams include your primary Cardiologist (physician) and Advanced Practice Providers (APPs -  Physician Assistants and Nurse Practitioners) who all work together to provide you with the care you need, when you need it.  We recommend signing up for the patient portal called "MyChart".  Sign up information is provided on this After Visit Summary.  MyChart is used to connect with patients for Virtual Visits (Telemedicine).  Patients are able to view lab/test results, encounter notes, upcoming appointments, etc.  Non-urgent messages can be sent to your provider as well.   To learn more about what you can do with MyChart, go to [email or web address].    Your next appointment:   1-2 month(s)  Provider:   Edd Fabian NP   Other Instructions

## 2023-07-19 ENCOUNTER — Other Ambulatory Visit: Payer: Self-pay | Admitting: Cardiology

## 2023-07-19 DIAGNOSIS — E7800 Pure hypercholesterolemia, unspecified: Secondary | ICD-10-CM

## 2023-07-19 DIAGNOSIS — I10 Essential (primary) hypertension: Secondary | ICD-10-CM

## 2023-07-19 DIAGNOSIS — I2510 Atherosclerotic heart disease of native coronary artery without angina pectoris: Secondary | ICD-10-CM

## 2023-07-29 ENCOUNTER — Telehealth: Payer: Self-pay | Admitting: Internal Medicine

## 2023-07-29 NOTE — Telephone Encounter (Signed)
Patient would like his lab results from 06/21/23 to be mailed to the address on file. Best callback is [phone].

## 2023-07-30 NOTE — Telephone Encounter (Signed)
Labs have been printed and sent out to mail.

## 2023-08-12 NOTE — Telephone Encounter (Signed)
LVM for pt to call the clinic back to discuss his questions with his labs.

## 2023-08-12 NOTE — Telephone Encounter (Signed)
Copied from CRM [id number]. Topic: Clinical - Lab/Test Results >> Aug 12, 2023 11:45 AM Orinda Kenner C wrote: Reason for CRM: Pt would like to talk with the nurse/assistant on his lab resullts, need consult. Pls c/b at [phone]

## 2023-08-30 NOTE — Progress Notes (Signed)
 Cardiology Clinic Note   Patient Name: Curtis Jacobs Date of Encounter: 08/30/2023  Primary Care Provider:  Genia Kettering, MD Primary Cardiologist:  None  Patient Profile    Curtis Jacobs 89 year old male presents to the clinic today for follow-up evaluation of his coronary artery disease and hypertension.  Past Medical History    Past Medical History:  Diagnosis Date   BPH (benign prostatic hypertrophy)    Dr. Bosie Bye   CAD (coronary artery disease)    HA (headache)    Traction- Dr. Tilda Fogo- had MRI.MRA   HTN (hypertension)    Hyperlipidemia    Past Surgical History:  Procedure Laterality Date   EXCISIONAL HEMORRHOIDECTOMY     LUNG REMOVAL, PARTIAL Right     Allergies  Allergies  Allergen Reactions   Cordran  [Flurandrenolide ] Rash    rash   Lisinopril Cough    REACTION: cough    History of Present Illness    Curtis Jacobs has a PMH of coronary artery disease, HLD, HTN, and BPH.  Carotid Dopplers 12/20 showed no stenosis.  Chest CT 6/22 showed bronchiectasis, pulmonary nodules, and three-vessel coronary atherosclerosis with aortic atherosclerosis as well.  Echocardiogram 10/22 showed normal LV function, mild LVH, G1 DD, mild right ventricular enlargement, mild mitral regurgitation, mild aortic insufficiency, dilated ascending aorta measuring 44 mm and dilated aortic root measuring 45 mm.  Chest CT 12/22 showed previous right upper lobe resection, bronchiectasis and ectatic ascending aorta measuring 40 mm.  He was seen in follow-up by Dr. Audery Blazing on 11/18/2021.  During that time he continued to have dyspnea on exertion which he attributed to his bronchiectasis.  He denied orthopnea and PND.  He denied lower extremity swelling, chest pain and syncope.  He contacted the nurse triage line and reported that he was not feeling well.  He noted that he also had elevated blood pressure.  He presented to the clinic 07/05/23 for follow-up evaluation and stated he had  noticed over the past several weeks he had  increased  blood pressure.  He noted at home that his blood pressure was routinely in the 140s over 80s.  In the clinic initially his bp was146/80 and on recheck was 142/80.  He continued to have back discomfort related to his lumbar stenosis.  This limited his physical activity.  He took Tylenol  for this.  We reviewed secondary causes of hypertension.  I started amlodipine  2.5 mg daily, gave salty 6 diet sheet and planned follow-up in 1-2 months.  He presents to the clinic today for follow-up evaluation and states his blood pressure has been better controlled with amlodipine .  He remains very physically active working in his wood shop.  He continues to have back pain related to spinal stenosis.  He has been receiving steroid injections which have not provided much relief.  We briefly reviewed PRP and stem cell injections.  He reports that he will investigate these with his PCP.  I will continue his current medication regimen, have him continue to monitor his blood pressure, continue to eat low-sodium diet and maintain his physical activity.  Will plan follow-up in 6 months.  Today he denies chest pain, shortness of breath, lower extremity edema, fatigue, palpitations, melena, hematuria, hemoptysis, diaphoresis, weakness, presyncope, syncope, orthopnea, and PND.   Home Medications    Prior to Admission medications   Medication Sig Start Date End Date Taking? Authorizing Provider  aspirin  EC 81 MG tablet Take 1 tablet (81 mg total) by mouth  daily. Swallow whole. 11/18/21   Lenise Quince, MD  cetirizine (ZYRTEC) 10 MG tablet Take 1 tablet by mouth daily.    [provider]  Cholecalciferol 1000 UNITS tablet Take 2,000 Units by mouth daily.    [provider]  Dapsone  7.5 % GEL Apply 1 application topically daily. 12/10/20   Plotnikov, Aleksei V, MD  fluocinonide cream (LIDEX) 0.05 % Apply 1 application topically daily. 04/30/16   [provider]  Glucosamine-Chondroit-Vit C-Mn (GLUCOSAMINE CHONDROITIN COMPLX) CAPS Take 1 capsule by mouth daily.    [provider]  guaiFENesin  (MUCINEX ) 600 MG 12 hr tablet Take 2 tablets (1,200 mg total) by mouth 2 (two) times daily. Patient taking differently: Take 1,200 mg by mouth daily. 08/05/15   McQuaid, Douglas B, MD  ipratropium (ATROVENT ) 0.03 % nasal spray Place 2 sprays into both nostrils every 12 (twelve) hours. 09/07/22   Antonio Baumgarten, NP  ipratropium-albuterol  (DUONEB) 0.5-2.5 (3) MG/3ML SOLN Take 3 mLs by nebulization every 6 (six) hours as needed. 12/10/20   Plotnikov, Aleksei V, MD  loratadine  (CLARITIN ) 10 MG tablet Take 10 mg by mouth daily.    [provider]  Multiple Vitamin (MULTIVITAMIN) tablet Take 1 tablet by mouth daily.    [provider]  mupirocin ointment (BACTROBAN) 2 %  09/27/19   [provider]  rosuvastatin  (CRESTOR ) 20 MG tablet TAKE 1 TABLET(20 MG) BY MOUTH DAILY 06/18/23   Audery Blazing Deannie Fabian, MD  Saw Palmetto POWD Take by mouth daily.    [provider]  sodium chloride  HYPERTONIC 3 % nebulizer solution Take by nebulization as needed for other. Use this 2 times a day for cough. 09/03/21   Icard, Lucie Ruts, DO  terazosin  (HYTRIN ) 10 MG capsule TAKE 1 CAPSULE BY MOUTH EVERY NIGHT AT BEDTIME. ANNUAL APPT DUE IN AUGUST. 02/26/23   Plotnikov, Aleksei V, MD  triamcinolone  ointment (KENALOG ) 0.1 % APPLY EXTERNALLY TO THE AFFECTED AREA THREE TIMES DAILY 12/08/19   Plotnikov, Aleksei V, MD  vitamin C (ASCORBIC ACID) 500 MG tablet Take 500 mg by mouth daily.    [provider]    Family History    Family History  Problem Relation Age of Onset   Heart disease Father    He indicated that his mother is deceased. He indicated that his father is deceased.  Social History    Social History   Socioeconomic History   Marital status: Married    Spouse name: Not on file   Number of children: Not on file    Years of education: Not on file   Highest education level: Not on file  Occupational History   Not on file  Tobacco Use   Smoking status: Former    Current packs/day: 0.00    Average packs/day: 0.1 packs/day for 3.0 years (0.3 ttl pk-yrs)    Types: Cigarettes    Start date: 07/25/1949    Quit date: 07/25/1952    Years since quitting: 71.1   Smokeless tobacco: Never   Tobacco comments:    used to "smoke but not inhale"  Substance and Sexual Activity   Alcohol  use: No   Drug use: No   Sexual activity: Yes  Other Topics Concern   Not on file  Social History Narrative   Not on file   Social Drivers of Health   Financial Resource Strain: Low Risk  (04/05/2023)   Overall Financial Resource Strain (CARDIA)    Difficulty of Paying Living Expenses: Not hard  at all  Food Insecurity: No Food Insecurity (04/05/2023)   Hunger Vital Sign    Worried About Running Out of Food in the Last Year: Never true    Ran Out of Food in the Last Year: Never true  Transportation Needs: No Transportation Needs (04/05/2023)   PRAPARE - Administrator, Civil Service (Medical): No    Lack of Transportation (Non-Medical): No  Physical Activity: Sufficiently Active (04/05/2023)   Exercise Vital Sign    Days of Exercise per Week: 5 days    Minutes of Exercise per Session: 30 min  Stress: No Stress Concern Present (04/05/2023)   [employer] of Occupational Health - Occupational Stress Questionnaire    Feeling of Stress : Not at all  Social Connections: Socially Integrated (04/05/2023)   Social Connection and Isolation Panel [NHANES]    Frequency of Communication with Friends and Family: More than three times a week    Frequency of Social Gatherings with Friends and Family: Once a week    Attends Religious Services: More than 4 times per year    Active Member of [employer] or Organizations: No    Attends Engineer, structural: More than 4 times per year    Marital Status: Married  Careers information officer Violence: Not At Risk (04/05/2023)   Humiliation, Afraid, Rape, and Kick questionnaire    Fear of Current or Ex-Partner: No    Emotionally Abused: No    Physically Abused: No    Sexually Abused: No     Review of Systems    General:  No chills, fever, night sweats or weight changes.  Cardiovascular:  No chest pain, dyspnea on exertion, edema, orthopnea, palpitations, paroxysmal nocturnal dyspnea. Dermatological: No rash, lesions/masses Respiratory: No cough, dyspnea Urologic: No hematuria, dysuria Abdominal:   No nausea, vomiting, diarrhea, bright red blood per rectum, melena, or hematemesis Neurologic:  No visual changes, wkns, changes in mental status. All other systems reviewed and are otherwise negative except as noted above.  Physical Exam    VS:  There were no vitals taken for this visit. , BMI There is no height or weight on file to calculate BMI. GEN: Well nourished, well developed, in no acute distress. HEENT: normal. Neck: Supple, no JVD, carotid bruits, or masses. Cardiac: RRR, no murmurs, rubs, or gallops. No clubbing, cyanosis, edema.  Radials/DP/PT 2+ and equal bilaterally.  Respiratory:  Respirations regular and unlabored, clear to auscultation bilaterally. GI: Soft, nontender, nondistended, BS + x 4. MS: no deformity or atrophy. Skin: warm and dry, no rash. Neuro:  Strength and sensation are intact. Psych: Normal affect.  Accessory Clinical Findings    Recent Labs: 04/05/2023: TSH 1.46 06/21/2023: ALT 14; BUN 18; Creatinine, Ser 1.13; Hemoglobin 11.8; Platelets 108.0; Potassium 4.1; Sodium 142   Recent Lipid Panel    Component Value Date/Time   CHOL 132 04/05/2023 1608   CHOL 142 07/15/2021 0940   TRIG 194.0 (H) 04/05/2023 1608   HDL 38.20 (L) 04/05/2023 1608   HDL 42 07/15/2021 0940   CHOLHDL 3 04/05/2023 1608   VLDL 38.8 04/05/2023 1608   LDLCALC 55 04/05/2023 1608   LDLCALC 74 07/15/2021 0940   LDLDIRECT 101.0 01/10/2015 1117       ECG  personally reviewed by me today-none today.    Echocardiogram 06/06/2021  IMPRESSIONS     1. Left ventricular ejection fraction by 3D volume is 65 %. The left  ventricle has normal function. The left ventricle has no regional wall  motion abnormalities. The left ventricular internal cavity size was mildly  dilated. Left ventricular diastolic  parameters are consistent with Grade I diastolic dysfunction (impaired  relaxation).   2. Right ventricular systolic function is normal. The right ventricular  size is mildly enlarged.   3. The mitral valve is normal in structure. Mild mitral valve  regurgitation. No evidence of mitral stenosis.   4. The aortic valve is tricuspid. Aortic valve regurgitation is mild. No  aortic stenosis is present.   5. Aortic dilatation noted. There is mild dilatation of the ascending  aorta, measuring 44 mm. There is mild dilatation of the aortic root,  measuring 45 mm.   6. The inferior vena cava is normal in size with greater than 50%  respiratory variability, suggesting right atrial pressure of 3 mmHg.   FINDINGS   Left Ventricle: Left ventricular ejection fraction by 3D volume is 65 %.  The left ventricle has normal function. The left ventricle has no regional  wall motion abnormalities. The left ventricular internal cavity size was  mildly dilated. There is no left   ventricular hypertrophy. Left ventricular diastolic parameters are  consistent with Grade I diastolic dysfunction (impaired relaxation).   Right Ventricle: The right ventricular size is mildly enlarged. No  increase in right ventricular wall thickness. Right ventricular systolic  function is normal.   Left Atrium: Left atrial size was normal in size.   Right Atrium: Right atrial size was normal in size.   Pericardium: There is no evidence of pericardial effusion.   Mitral Valve: The mitral valve is normal in structure. Mild mitral valve  regurgitation. No evidence of mitral valve  stenosis.   Tricuspid Valve: The tricuspid valve is normal in structure. Tricuspid  valve regurgitation is not demonstrated. No evidence of tricuspid  stenosis.   Aortic Valve: The aortic valve is tricuspid. Aortic valve regurgitation is  mild. Aortic regurgitation PHT measures 673 msec. No aortic stenosis is  present.   Pulmonic Valve: The pulmonic valve was normal in structure. Pulmonic valve  regurgitation is mild. No evidence of pulmonic stenosis.   Aorta: Aortic dilatation noted. There is mild dilatation of the ascending  aorta, measuring 44 mm. There is mild dilatation of the aortic root,  measuring 45 mm.   Venous: The inferior vena cava is normal in size with greater than 50%  respiratory variability, suggesting right atrial pressure of 3 mmHg.   IAS/Shunts: No atrial level shunt detected by color flow Doppler.      Assessment & Plan   1.  Essential hypertension-BP today 133/75   Continue amlodipline 2.5 mg daily Continue heart healthy low-sodium diet Maintain blood pressure log-reviewed   Coronary artery disease-no anginal symptoms.  Noted to have three-vessel coronary atherosclerosis on chest CT. continues with baseline physical activity.  No anginal chest discomfort Continue aspirin , rosuvastatin  Maintain physical activity   Hyperlipidemia-LDL 55 on 04/05/23. High-fiber diet Continue current medical therapy Plan for repeat fasting lipids 8/25  Disposition: Follow-up with Dr. Audery Blazing or me in 6 months.   Chet Cota. Jessenia Filippone NP-C     08/30/2023, 11:11 AM [facility] [address] Office [phone] Fax [phone]    I spent 14 minutes examining this patient, reviewing medications, and using patient centered shared decision making involving her cardiac care.   I spent greater than 20 minutes reviewing her past medical history,  medications, and prior cardiac tests.

## 2023-09-01 ENCOUNTER — Ambulatory Visit: Payer: Medicare Other | Attending: General Practice | Admitting: General Practice

## 2023-09-01 ENCOUNTER — Encounter: Payer: Self-pay | Admitting: General Practice

## 2023-09-01 VITALS — BP 133/75 | HR 96 | Ht 70.0 in | Wt 196.4 lb

## 2023-09-01 DIAGNOSIS — E7800 Pure hypercholesterolemia, unspecified: Secondary | ICD-10-CM | POA: Diagnosis not present

## 2023-09-01 DIAGNOSIS — I2510 Atherosclerotic heart disease of native coronary artery without angina pectoris: Secondary | ICD-10-CM

## 2023-09-01 DIAGNOSIS — I10 Essential (primary) hypertension: Secondary | ICD-10-CM

## 2023-09-01 NOTE — Patient Instructions (Addendum)
 Medication Instructions:  The current medical regimen is effective;  continue present plan and medications as directed. Please refer to the Current Medication list given to you today.  *If you need a refill on your cardiac medications before your next appointment, please call your pharmacy*  Lab Work: NONE  Other Instructions CONTINUE LOW SODIUM DIET-ATTACHED CONTINUE PHYSICAL CURRENT PHYSICAL ACTIVITY  Follow-Up: At [facility], you and your health needs are our priority.  As part of our continuing mission to provide you with exceptional heart care, we have created designated Provider Care Teams.  These Care Teams include your primary Cardiologist (physician) and Advanced Practice Providers (APPs -  Physician Assistants and Nurse Practitioners) who all work together to provide you with the care you need, when you need it.  We recommend signing up for the patient portal called "MyChart".  Sign up information is provided on this After Visit Summary.  MyChart is used to connect with patients for Virtual Visits (Telemedicine).  Patients are able to view lab/test results, encounter notes, upcoming appointments, etc.  Non-urgent messages can be sent to your provider as well.   To learn more about what you can do with MyChart, go to [email or web address].    Your next appointment:   6 month(s)  Provider:   Alexandria Angel, MD  or Lawana Pray, FNP          DASH Eating Plan DASH stands for Dietary Approaches to Stop Hypertension. The DASH eating plan is a healthy eating plan that has been shown to: Lower high blood pressure (hypertension). Reduce your risk for type 2 diabetes, heart disease, and stroke. Help with weight loss. What are tips for following this plan? Reading food labels Check food labels for the amount of salt (sodium) per serving. Choose foods with less than 5 percent of the Daily Value (DV) of sodium. In general, foods with less than 300 milligrams (mg) of sodium  per serving fit into this eating plan. To find whole grains, look for the word "whole" as the first word in the ingredient list. Shopping Buy products labeled as "low-sodium" or "no salt added." Buy fresh foods. Avoid canned foods and pre-made or frozen meals. Cooking Try not to add salt when you cook. Use salt-free seasonings or herbs instead of table salt or sea salt. Check with your health care provider or pharmacist before using salt substitutes. Do not fry foods. Cook foods in healthy ways, such as baking, boiling, grilling, roasting, or broiling. Cook using oils that are good for your heart. These include olive, canola, avocado, soybean, and sunflower oil. Meal planning  Eat a balanced diet. This should include: 4 or more servings of fruits and 4 or more servings of vegetables each day. Try to fill half of your plate with fruits and vegetables. 6-8 servings of whole grains each day. 6 or less servings of lean meat, poultry, or fish each day. 1 oz is 1 serving. A 3 oz (85 g) serving of meat is about the same size as the palm of your hand. One egg is 1 oz (28 g). 2-3 servings of low-fat dairy each day. One serving is 1 cup (237 mL). 1 serving of nuts, seeds, or beans 5 times each week. 2-3 servings of heart-healthy fats. Healthy fats called omega-3 fatty acids are found in foods such as walnuts, flaxseeds, fortified milks, and eggs. These fats are also found in cold-water fish, such as sardines, salmon, and mackerel. Limit how much you eat of: Canned or prepackaged  foods. Food that is high in trans fat, such as fried foods. Food that is high in saturated fat, such as fatty meat. Desserts and other sweets, sugary drinks, and other foods with added sugar. Full-fat dairy products. Do not salt foods before eating. Do not eat more than 4 egg yolks a week. Try to eat at least 2 vegetarian meals a week. Eat more home-cooked food and less restaurant, buffet, and fast food. Lifestyle When  eating at a restaurant, ask if your food can be made with less salt or no salt. If you drink alcohol : Limit how much you have to: 0-1 drink a day if you are male. 0-2 drinks a day if you are male. Know how much alcohol  is in your drink. In the [place] one drink is one 12 oz bottle of beer (355 mL), one 5 oz glass of wine (148 mL), or one 1 oz glass of hard liquor (44 mL). General information Avoid eating more than 2,300 mg of salt a day. If you have hypertension, you may need to reduce your sodium intake to 1,500 mg a day. Work with your provider to stay at a healthy body weight or lose weight. Ask what the best weight range is for you. On most days of the week, get at least 30 minutes of exercise that causes your heart to beat faster. This may include walking, swimming, or biking. Work with your provider or dietitian to adjust your eating plan to meet your specific calorie needs. What foods should I eat? Fruits All fresh, dried, or frozen fruit. Canned fruits that are in their natural juice and do not have sugar added to them. Vegetables Fresh or frozen vegetables that are raw, steamed, roasted, or grilled. Low-sodium or reduced-sodium tomato and vegetable juice. Low-sodium or reduced-sodium tomato sauce and tomato paste. Low-sodium or reduced-sodium canned vegetables. Grains Whole-grain or whole-wheat bread. Whole-grain or whole-wheat pasta. Brown rice. Dwyane Glad. Bulgur. Whole-grain and low-sodium cereals. Pita bread. Low-fat, low-sodium crackers. Whole-wheat flour tortillas. Meats and other proteins Skinless chicken or [place]. Ground chicken or [place]. Pork with fat trimmed off. Fish and seafood. Egg whites. Dried beans, peas, or lentils. Unsalted nuts, nut butters, and seeds. Unsalted canned beans. Lean cuts of beef with fat trimmed off. Low-sodium, lean precooked or cured meat, such as sausages or meat loaves. Dairy Low-fat (1%) or fat-free (skim) milk. Reduced-fat, low-fat, or  fat-free cheeses. Nonfat, low-sodium ricotta or cottage cheese. Low-fat or nonfat yogurt. Low-fat, low-sodium cheese. Fats and oils Soft margarine without trans fats. Vegetable oil. Reduced-fat, low-fat, or light mayonnaise and salad dressings (reduced-sodium). Canola, safflower, olive, avocado, soybean, and sunflower oils. Avocado. Seasonings and condiments Herbs. Spices. Seasoning mixes without salt. Other foods Unsalted popcorn and pretzels. Fat-free sweets. The items listed above may not be all the foods and drinks you can have. Talk to a dietitian to learn more. What foods should I avoid? Fruits Canned fruit in a light or heavy syrup. Fried fruit. Fruit in cream or butter sauce. Vegetables Creamed or fried vegetables. Vegetables in a cheese sauce. Regular canned vegetables that are not marked as low-sodium or reduced-sodium. Regular canned tomato sauce and paste that are not marked as low-sodium or reduced-sodium. Regular tomato and vegetable juices that are not marked as low-sodium or reduced-sodium. Vanessa General. Olives. Grains Baked goods made with fat, such as croissants, muffins, or some breads. Dry pasta or rice meal packs. Meats and other proteins Fatty cuts of meat. Ribs. Fried meat. Helene Loader. Bologna, salami, and other precooked  or cured meats, such as sausages or meat loaves, that are not lean and low in sodium. Fat from the back of a pig (fatback). Bratwurst. Salted nuts and seeds. Canned beans with added salt. Canned or smoked fish. Whole eggs or egg yolks. Chicken or [place] with skin. Dairy Whole or 2% milk, cream, and half-and-half. Whole or full-fat cream cheese. Whole-fat or sweetened yogurt. Full-fat cheese. Nondairy creamers. Whipped toppings. Processed cheese and cheese spreads. Fats and oils Butter. Stick margarine. Lard. Shortening. Ghee. Bacon fat. Tropical oils, such as coconut, palm kernel, or palm oil. Seasonings and condiments Onion salt, garlic salt, seasoned salt, table  salt, and sea salt. Worcestershire sauce. Tartar sauce. Barbecue sauce. Teriyaki sauce. Soy sauce, including reduced-sodium soy sauce. Steak sauce. Canned and packaged gravies. Fish sauce. Oyster sauce. Cocktail sauce. Store-bought horseradish. Ketchup. Mustard. Meat flavorings and tenderizers. Bouillon cubes. Hot sauces. Pre-made or packaged marinades. Pre-made or packaged taco seasonings. Relishes. Regular salad dressings. Other foods Salted popcorn and pretzels. The items listed above may not be all the foods and drinks you should avoid. Talk to a dietitian to learn more. Where to find more information National Heart, Lung, and Blood Institute (NHLBI): BuffaloDryCleaner.gl American Heart Association (AHA): heart.org Academy of Nutrition and Dietetics: eatright.org National Kidney Foundation (NKF): kidney.org This information is not intended to replace advice given to you by your health care provider. Make sure you discuss any questions you have with your health care provider. Document Revised: 08/20/2022 Document Reviewed: 08/20/2022 Elsevier Patient Education  2024 [employer].

## 2023-09-10 DIAGNOSIS — M25512 Pain in left shoulder: Secondary | ICD-10-CM | POA: Diagnosis not present

## 2023-09-10 DIAGNOSIS — M5412 Radiculopathy, cervical region: Secondary | ICD-10-CM | POA: Diagnosis not present

## 2023-09-17 DIAGNOSIS — R3121 Asymptomatic microscopic hematuria: Secondary | ICD-10-CM | POA: Diagnosis not present

## 2023-09-20 DIAGNOSIS — M542 Cervicalgia: Secondary | ICD-10-CM | POA: Diagnosis not present

## 2023-10-07 ENCOUNTER — Encounter: Payer: Self-pay | Admitting: Internal Medicine

## 2023-10-07 ENCOUNTER — Ambulatory Visit (INDEPENDENT_AMBULATORY_CARE_PROVIDER_SITE_OTHER): Payer: Medicare Other | Admitting: Internal Medicine

## 2023-10-07 VITALS — BP 134/60 | HR 72 | Temp 98.6°F | Ht 70.0 in | Wt 200.0 lb

## 2023-10-07 DIAGNOSIS — D649 Anemia, unspecified: Secondary | ICD-10-CM

## 2023-10-07 DIAGNOSIS — R3129 Other microscopic hematuria: Secondary | ICD-10-CM

## 2023-10-07 DIAGNOSIS — N401 Enlarged prostate with lower urinary tract symptoms: Secondary | ICD-10-CM | POA: Diagnosis not present

## 2023-10-07 DIAGNOSIS — Z0000 Encounter for general adult medical examination without abnormal findings: Secondary | ICD-10-CM | POA: Diagnosis not present

## 2023-10-07 DIAGNOSIS — J479 Bronchiectasis, uncomplicated: Secondary | ICD-10-CM

## 2023-10-07 LAB — COMPREHENSIVE METABOLIC PANEL WITH GFR
ALT: 12 U/L (ref 0–53)
AST: 14 U/L (ref 0–37)
Albumin: 4.1 g/dL (ref 3.5–5.2)
Alkaline Phosphatase: 83 U/L (ref 39–117)
BUN: 19 mg/dL (ref 6–23)
CO2: 30 meq/L (ref 19–32)
Calcium: 9.3 mg/dL (ref 8.4–10.5)
Chloride: 107 meq/L (ref 96–112)
Creatinine, Ser: 1.3 mg/dL (ref 0.40–1.50)
GFR: 49.13 mL/min — ABNORMAL LOW
Glucose, Bld: 112 mg/dL — ABNORMAL HIGH (ref 70–99)
Potassium: 4 meq/L (ref 3.5–5.1)
Sodium: 144 meq/L (ref 135–145)
Total Bilirubin: 0.6 mg/dL (ref 0.2–1.2)
Total Protein: 6.5 g/dL (ref 6.0–8.3)

## 2023-10-07 LAB — CBC WITH DIFFERENTIAL/PLATELET
Basophils Absolute: 0 10*3/uL (ref 0.0–0.1)
Basophils Relative: 0.6 % (ref 0.0–3.0)
Eosinophils Absolute: 0.1 10*3/uL (ref 0.0–0.7)
Eosinophils Relative: 1.3 % (ref 0.0–5.0)
HCT: 36.3 % — ABNORMAL LOW (ref 39.0–52.0)
Hemoglobin: 12.3 g/dL — ABNORMAL LOW (ref 13.0–17.0)
Lymphocytes Relative: 36.3 % (ref 12.0–46.0)
Lymphs Abs: 1.5 10*3/uL (ref 0.7–4.0)
MCHC: 33.8 g/dL (ref 30.0–36.0)
MCV: 94.4 fL (ref 78.0–100.0)
Monocytes Absolute: 0.4 10*3/uL (ref 0.1–1.0)
Monocytes Relative: 9.6 % (ref 3.0–12.0)
Neutro Abs: 2.1 10*3/uL (ref 1.4–7.7)
Neutrophils Relative %: 52.2 % (ref 43.0–77.0)
Platelets: 99 10*3/uL — ABNORMAL LOW (ref 150.0–400.0)
RBC: 3.84 Mil/uL — ABNORMAL LOW (ref 4.22–5.81)
RDW: 14.4 % (ref 11.5–15.5)
WBC: 4.1 10*3/uL (ref 4.0–10.5)

## 2023-10-07 LAB — PSA: PSA: 3.91 ng/mL (ref 0.10–4.00)

## 2023-10-07 LAB — TSH: TSH: 3.24 u[IU]/mL (ref 0.35–5.50)

## 2023-10-07 NOTE — Assessment & Plan Note (Signed)
Check iron/CBC

## 2023-10-07 NOTE — Assessment & Plan Note (Signed)
F/u w/Dr Jennette Bill

## 2023-10-07 NOTE — Assessment & Plan Note (Signed)
No cough 

## 2023-10-07 NOTE — Assessment & Plan Note (Signed)
  We discussed age appropriate health related issues, including available/recomended screening tests and vaccinations. Labs were ordered to be later reviewed . All questions were answered. We discussed one or more of the following - seat belt use, use of sunscreen/sun exposure exercise, fall risk reduction, second hand smoke exposure, firearm use and storage, seat belt use, a need for adhering to healthy diet and exercise. Labs were ordered.  All questions were answered. Cologuard test was offered. Rectal - per Urology - Dr Jennette Bill - cystoscopy is pending

## 2023-10-07 NOTE — Assessment & Plan Note (Signed)
F/u w/Dr Jennette Bill - cystoscopy is pending

## 2023-10-07 NOTE — Progress Notes (Signed)
Subjective:  Patient ID: Curtis Jacobs, male    DOB: April 10, 1933  Age: 89 y.o. MRN: [id number]  CC: Annual Exam (Annual Exam. Repeat labs. Patient is not fasting. Noted mild vertigo spell yesterday)   HPI Curtis Jacobs presents for a well exam   Outpatient Medications Prior to Visit  Medication Sig Dispense Refill   aspirin EC 81 MG tablet Take 1 tablet (81 mg total) by mouth daily. Swallow whole. 90 tablet 3   cetirizine (ZYRTEC) 10 MG tablet Take 1 tablet by mouth daily.     Cholecalciferol 1000 UNITS tablet Take 2,000 Units by mouth daily.     Dapsone 7.5 % GEL Apply 1 application topically daily. 90 g 1   ferrous sulfate 324 MG TBEC Take 324 mg by mouth. Nature Made     fluocinonide cream (LIDEX) 0.05 % Apply 1 application topically daily.     Glucosamine-Chondroit-Vit C-Mn (GLUCOSAMINE CHONDROITIN COMPLX) CAPS Take 1 capsule by mouth daily.     ipratropium (ATROVENT) 0.03 % nasal spray Place 2 sprays into both nostrils every 12 (twelve) hours. 30 mL 5   ipratropium-albuterol (DUONEB) 0.5-2.5 (3) MG/3ML SOLN Take 3 mLs by nebulization every 6 (six) hours as needed. 120 mL 3   Multiple Vitamin (MULTIVITAMIN) tablet Take 1 tablet by mouth daily.     mupirocin ointment (BACTROBAN) 2 %      rosuvastatin (CRESTOR) 20 MG tablet TAKE 1 TABLET(20 MG) BY MOUTH DAILY 90 tablet 3   Saw Palmetto POWD Take by mouth daily.     sodium chloride HYPERTONIC 3 % nebulizer solution Take by nebulization as needed for other. Use this 2 times a day for cough. 750 mL 5   terazosin (HYTRIN) 10 MG capsule TAKE 1 CAPSULE BY MOUTH EVERY NIGHT AT BEDTIME. ANNUAL APPT DUE IN [place]. 90 capsule 3   triamcinolone ointment (KENALOG) 0.1 % APPLY EXTERNALLY TO THE AFFECTED AREA THREE TIMES DAILY 80 g 3   vitamin C (ASCORBIC ACID) 500 MG tablet Take 500 mg by mouth daily.     amLODipine (NORVASC) 2.5 MG tablet Take 1 tablet (2.5 mg total) by mouth daily. 90 tablet 3   No facility-administered medications prior to  visit.    ROS: Review of Systems  Constitutional:  Negative for appetite change, fatigue and unexpected weight change.  HENT:  Negative for congestion, nosebleeds, sneezing, sore throat and trouble swallowing.   Eyes:  Negative for itching and visual disturbance.  Respiratory:  Negative for cough.   Cardiovascular:  Negative for chest pain, palpitations and leg swelling.  Gastrointestinal:  Negative for abdominal distention, blood in stool, diarrhea and nausea.  Genitourinary:  Negative for frequency and hematuria.  Musculoskeletal:  Negative for back pain, gait problem, joint swelling and neck pain.  Skin:  Positive for rash.  Neurological:  Negative for dizziness, tremors, speech difficulty and weakness.  Psychiatric/Behavioral:  Negative for agitation, dysphoric mood, sleep disturbance and suicidal ideas. The patient is not nervous/anxious.     Objective:  BP 134/60   Pulse 72   Temp 98.6 F (37 C)   Ht 5\' 10"  (1.778 m)   Wt 200 lb (90.7 kg)   SpO2 95%   BMI 28.70 kg/m   BP Readings from Last 3 Encounters:  10/07/23 134/60  09/01/23 133/75  07/05/23 (!) 142/80    Wt Readings from Last 3 Encounters:  10/07/23 200 lb (90.7 kg)  09/01/23 196 lb 6.4 oz (89.1 kg)  07/05/23 195 lb (88.5 kg)  Physical Exam Constitutional:      General: He is not in acute distress.    Appearance: He is well-developed. He is obese.     Comments: NAD  Eyes:     Conjunctiva/sclera: Conjunctivae normal.     Pupils: Pupils are equal, round, and reactive to light.  Neck:     Thyroid: No thyromegaly.     Vascular: No JVD.  Cardiovascular:     Rate and Rhythm: Normal rate and regular rhythm.     Heart sounds: Normal heart sounds. No murmur heard.    No friction rub. No gallop.  Pulmonary:     Effort: Pulmonary effort is normal. No respiratory distress.     Breath sounds: Normal breath sounds. No wheezing or rales.  Chest:     Chest wall: No tenderness.  Abdominal:     General: Bowel  sounds are normal. There is no distension.     Palpations: Abdomen is soft. There is no mass.     Tenderness: There is no abdominal tenderness. There is no guarding or rebound.  Musculoskeletal:        General: No tenderness. Normal range of motion.     Cervical back: Normal range of motion.  Lymphadenopathy:     Cervical: No cervical adenopathy.  Skin:    General: Skin is warm and dry.     Findings: No rash.  Neurological:     Mental Status: He is alert and oriented to person, place, and time.     Cranial Nerves: No cranial nerve deficit.     Motor: No abnormal muscle tone.     Coordination: Coordination normal.     Gait: Gait normal.     Deep Tendon Reflexes: Reflexes are normal and symmetric.  Psychiatric:        Behavior: Behavior normal.        Thought Content: Thought content normal.        Judgment: Judgment normal.   Rectal - per Urology  Lab Results  Component Value Date   WBC 4.2 06/21/2023   HGB 11.8 (L) 06/21/2023   HCT 36.1 (L) 06/21/2023   PLT 108.0 (L) 06/21/2023   GLUCOSE 98 06/21/2023   CHOL 132 04/05/2023   TRIG 194.0 (H) 04/05/2023   HDL 38.20 (L) 04/05/2023   LDLDIRECT 101.0 01/10/2015   LDLCALC 55 04/05/2023   ALT 14 06/21/2023   AST 14 06/21/2023   NA 142 06/21/2023   K 4.1 06/21/2023   CL 109 06/21/2023   CREATININE 1.13 06/21/2023   BUN 18 06/21/2023   CO2 28 06/21/2023   TSH 1.46 04/05/2023   PSA 3.89 04/05/2023   HGBA1C 5.8 02/02/2019    CT Chest Wo Contrast Result Date: 08/09/2021 CLINICAL DATA:  Follow-up pulmonary nodule, bronchiectasis EXAM: CT CHEST WITHOUT CONTRAST TECHNIQUE: Multidetector CT imaging of the chest was performed following the standard protocol without IV contrast. COMPARISON:  CT chest dated 02/05/2021 FINDINGS: Cardiovascular: The heart is normal in size. No pericardial effusion. Ectasia of the ascending thoracic aorta, measuring 4.0 cm. Atherosclerotic calcifications of the aortic root/arch. Three vessel coronary  atherosclerosis. Mediastinum/Nodes: No suspicious mediastinal lymphadenopathy. Visualized thyroid is unremarkable. Lungs/Pleura: Biapical pleural-parenchymal scarring. Status post partial right upper lobe wedge resection. Upper lobe predominant bronchiectasis with scattered peribronchovascular nodularity, similar to the prior. 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 Dominant nodules including a 12 mm partially calcified nodule in the posterior left upper lobe are chronic (dating back multiple years) and benign. No  pleural effusion or pneumothorax. Upper Abdomen: Visualized upper abdomen is notable for cholelithiasis and vascular calcifications. Musculoskeletal: Mild degenerative changes of the visualized thoracolumbar spine. IMPRESSION: Status post right upper lobe wedge resection. 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 Additional dominant nodules are chronic/benign. Dedicated follow-up imaging is not required. Ectasia the ascending thoracic aorta, measuring 4.0 cm. Recommend annual imaging followup by CTA or MRA. This recommendation follows 2010 ACCF/AHA/AATS/ACR/ASA/SCA/SCAI/SIR/STS/SVM Guidelines for the Diagnosis and Management of Patients with Thoracic Aortic Disease. Circulation. 2010; 121: Z610-R604. Aortic aneurysm NOS (ICD10-I71.9) Aortic Atherosclerosis (ICD10-I70.0). Electronically Signed   By: Charline Bills M.D.   On: 08/09/2021 08:45    Assessment & Plan:   Problem List Items Addressed This Visit     BPH (benign prostatic hyperplasia)   F/u w/Dr Jennette Bill      Well adult exam - Primary    We discussed age appropriate health related issues, including available/recomended screening tests and vaccinations. Labs were ordered to be later reviewed . All questions were answered. We discussed one or more of the  following - seat belt use, use of sunscreen/sun exposure exercise, fall risk reduction, second hand smoke exposure, firearm use and storage, seat belt use, a need for adhering to healthy diet and exercise. Labs were ordered.  All questions were answered. Cologuard test was offered. Rectal - per Urology - Dr Jennette Bill - cystoscopy is pending      Relevant Orders   Comprehensive metabolic panel   CBC with Differential/Platelet   Iron, TIBC and Ferritin Panel   PSA   TSH   Bronchiectasis without acute exacerbation (HCC)   No cough      Microscopic hematuria   F/u w/Dr Jennette Bill - cystoscopy is pending      Relevant Orders   Comprehensive metabolic panel   CBC with Differential/Platelet   Iron, TIBC and Ferritin Panel   Anemia   Check iron/CBC      Relevant Medications   ferrous sulfate 324 MG TBEC   Other Relevant Orders   CBC with Differential/Platelet   Iron, TIBC and Ferritin Panel      No orders of the defined types were placed in this encounter.     Follow-up: Return in about 6 months (around 04/05/2024) for a follow-up visit.  Sonda Primes, MD

## 2023-10-08 LAB — IRON,TIBC AND FERRITIN PANEL
%SAT: 26 % (ref 20–48)
Ferritin: 348 ng/mL (ref 24–380)
Iron: 80 ug/dL (ref 50–180)
TIBC: 303 ug/dL (ref 250–425)

## 2023-10-14 DIAGNOSIS — N281 Cyst of kidney, acquired: Secondary | ICD-10-CM | POA: Diagnosis not present

## 2023-10-14 DIAGNOSIS — R3121 Asymptomatic microscopic hematuria: Secondary | ICD-10-CM | POA: Diagnosis not present

## 2023-10-14 DIAGNOSIS — N132 Hydronephrosis with renal and ureteral calculous obstruction: Secondary | ICD-10-CM | POA: Diagnosis not present

## 2023-10-14 DIAGNOSIS — K8020 Calculus of gallbladder without cholecystitis without obstruction: Secondary | ICD-10-CM | POA: Diagnosis not present

## 2023-10-15 DIAGNOSIS — M25512 Pain in left shoulder: Secondary | ICD-10-CM | POA: Diagnosis not present

## 2023-10-15 DIAGNOSIS — M542 Cervicalgia: Secondary | ICD-10-CM | POA: Diagnosis not present

## 2023-10-25 DIAGNOSIS — R3121 Asymptomatic microscopic hematuria: Secondary | ICD-10-CM | POA: Diagnosis not present

## 2023-10-25 DIAGNOSIS — N200 Calculus of kidney: Secondary | ICD-10-CM | POA: Diagnosis not present

## 2023-10-25 DIAGNOSIS — N210 Calculus in bladder: Secondary | ICD-10-CM | POA: Diagnosis not present

## 2023-10-25 DIAGNOSIS — N35013 Post-traumatic anterior urethral stricture: Secondary | ICD-10-CM | POA: Diagnosis not present

## 2023-10-31 ENCOUNTER — Emergency Department (HOSPITAL_COMMUNITY)

## 2023-10-31 ENCOUNTER — Inpatient Hospital Stay (HOSPITAL_COMMUNITY)

## 2023-10-31 ENCOUNTER — Encounter (HOSPITAL_COMMUNITY): Payer: Self-pay | Admitting: Emergency Medicine

## 2023-10-31 ENCOUNTER — Other Ambulatory Visit: Payer: Self-pay

## 2023-10-31 ENCOUNTER — Inpatient Hospital Stay (HOSPITAL_COMMUNITY)
Admission: EM | Admit: 2023-10-31 | Discharge: 2023-11-05 | DRG: 872 | Disposition: A | Discharge status: Home Health | Attending: Internal Medicine | Admitting: Internal Medicine

## 2023-10-31 DIAGNOSIS — R0601 Orthopnea: Secondary | ICD-10-CM | POA: Diagnosis present

## 2023-10-31 DIAGNOSIS — N390 Urinary tract infection, site not specified: Secondary | ICD-10-CM | POA: Diagnosis not present

## 2023-10-31 DIAGNOSIS — R918 Other nonspecific abnormal finding of lung field: Secondary | ICD-10-CM

## 2023-10-31 DIAGNOSIS — G8929 Other chronic pain: Secondary | ICD-10-CM | POA: Diagnosis present

## 2023-10-31 DIAGNOSIS — E872 Acidosis, unspecified: Secondary | ICD-10-CM | POA: Diagnosis not present

## 2023-10-31 DIAGNOSIS — R3911 Hesitancy of micturition: Secondary | ICD-10-CM | POA: Diagnosis present

## 2023-10-31 DIAGNOSIS — R6889 Other general symptoms and signs: Secondary | ICD-10-CM | POA: Diagnosis not present

## 2023-10-31 DIAGNOSIS — N400 Enlarged prostate without lower urinary tract symptoms: Secondary | ICD-10-CM | POA: Diagnosis present

## 2023-10-31 DIAGNOSIS — N201 Calculus of ureter: Secondary | ICD-10-CM | POA: Diagnosis not present

## 2023-10-31 DIAGNOSIS — R000 Tachycardia, unspecified: Secondary | ICD-10-CM | POA: Diagnosis not present

## 2023-10-31 DIAGNOSIS — R531 Weakness: Secondary | ICD-10-CM | POA: Diagnosis not present

## 2023-10-31 DIAGNOSIS — R509 Fever, unspecified: Secondary | ICD-10-CM | POA: Diagnosis not present

## 2023-10-31 DIAGNOSIS — E785 Hyperlipidemia, unspecified: Secondary | ICD-10-CM

## 2023-10-31 DIAGNOSIS — I428 Other cardiomyopathies: Secondary | ICD-10-CM | POA: Diagnosis not present

## 2023-10-31 DIAGNOSIS — J479 Bronchiectasis, uncomplicated: Secondary | ICD-10-CM | POA: Diagnosis not present

## 2023-10-31 DIAGNOSIS — Z902 Acquired absence of lung [part of]: Secondary | ICD-10-CM | POA: Diagnosis not present

## 2023-10-31 DIAGNOSIS — Z87891 Personal history of nicotine dependence: Secondary | ICD-10-CM

## 2023-10-31 DIAGNOSIS — B9620 Unspecified Escherichia coli [E. coli] as the cause of diseases classified elsewhere: Secondary | ICD-10-CM | POA: Diagnosis present

## 2023-10-31 DIAGNOSIS — B952 Enterococcus as the cause of diseases classified elsewhere: Secondary | ICD-10-CM | POA: Diagnosis present

## 2023-10-31 DIAGNOSIS — N200 Calculus of kidney: Secondary | ICD-10-CM

## 2023-10-31 DIAGNOSIS — R0689 Other abnormalities of breathing: Secondary | ICD-10-CM | POA: Diagnosis not present

## 2023-10-31 DIAGNOSIS — I3139 Other pericardial effusion (noninflammatory): Secondary | ICD-10-CM | POA: Diagnosis not present

## 2023-10-31 DIAGNOSIS — D61818 Other pancytopenia: Secondary | ICD-10-CM | POA: Diagnosis not present

## 2023-10-31 DIAGNOSIS — N1831 Chronic kidney disease, stage 3a: Secondary | ICD-10-CM | POA: Diagnosis present

## 2023-10-31 DIAGNOSIS — N189 Chronic kidney disease, unspecified: Secondary | ICD-10-CM

## 2023-10-31 DIAGNOSIS — Z79899 Other long term (current) drug therapy: Secondary | ICD-10-CM

## 2023-10-31 DIAGNOSIS — R3914 Feeling of incomplete bladder emptying: Secondary | ICD-10-CM | POA: Diagnosis not present

## 2023-10-31 DIAGNOSIS — N179 Acute kidney failure, unspecified: Secondary | ICD-10-CM

## 2023-10-31 DIAGNOSIS — N2889 Other specified disorders of kidney and ureter: Secondary | ICD-10-CM | POA: Diagnosis not present

## 2023-10-31 DIAGNOSIS — Z888 Allergy status to other drugs, medicaments and biological substances status: Secondary | ICD-10-CM

## 2023-10-31 DIAGNOSIS — N10 Acute tubulo-interstitial nephritis: Secondary | ICD-10-CM | POA: Diagnosis not present

## 2023-10-31 DIAGNOSIS — R7989 Other specified abnormal findings of blood chemistry: Secondary | ICD-10-CM

## 2023-10-31 DIAGNOSIS — A419 Sepsis, unspecified organism: Secondary | ICD-10-CM | POA: Diagnosis not present

## 2023-10-31 DIAGNOSIS — Z1152 Encounter for screening for COVID-19: Secondary | ICD-10-CM | POA: Diagnosis not present

## 2023-10-31 DIAGNOSIS — I452 Bifascicular block: Secondary | ICD-10-CM | POA: Diagnosis not present

## 2023-10-31 DIAGNOSIS — R338 Other retention of urine: Secondary | ICD-10-CM | POA: Diagnosis present

## 2023-10-31 DIAGNOSIS — N210 Calculus in bladder: Secondary | ICD-10-CM | POA: Diagnosis present

## 2023-10-31 DIAGNOSIS — I129 Hypertensive chronic kidney disease with stage 1 through stage 4 chronic kidney disease, or unspecified chronic kidney disease: Secondary | ICD-10-CM | POA: Diagnosis not present

## 2023-10-31 DIAGNOSIS — N401 Enlarged prostate with lower urinary tract symptoms: Secondary | ICD-10-CM | POA: Diagnosis present

## 2023-10-31 DIAGNOSIS — I2510 Atherosclerotic heart disease of native coronary artery without angina pectoris: Secondary | ICD-10-CM | POA: Diagnosis not present

## 2023-10-31 DIAGNOSIS — Z7982 Long term (current) use of aspirin: Secondary | ICD-10-CM

## 2023-10-31 DIAGNOSIS — Y92009 Unspecified place in unspecified non-institutional (private) residence as the place of occurrence of the external cause: Secondary | ICD-10-CM

## 2023-10-31 DIAGNOSIS — R82998 Other abnormal findings in urine: Secondary | ICD-10-CM | POA: Diagnosis not present

## 2023-10-31 DIAGNOSIS — N12 Tubulo-interstitial nephritis, not specified as acute or chronic: Secondary | ICD-10-CM | POA: Diagnosis not present

## 2023-10-31 DIAGNOSIS — Z8249 Family history of ischemic heart disease and other diseases of the circulatory system: Secondary | ICD-10-CM | POA: Diagnosis not present

## 2023-10-31 DIAGNOSIS — Z860100 Personal history of colon polyps, unspecified: Secondary | ICD-10-CM

## 2023-10-31 DIAGNOSIS — I10 Essential (primary) hypertension: Secondary | ICD-10-CM | POA: Diagnosis not present

## 2023-10-31 DIAGNOSIS — W19XXXA Unspecified fall, initial encounter: Secondary | ICD-10-CM | POA: Diagnosis not present

## 2023-10-31 DIAGNOSIS — K8020 Calculus of gallbladder without cholecystitis without obstruction: Secondary | ICD-10-CM | POA: Diagnosis not present

## 2023-10-31 DIAGNOSIS — R0602 Shortness of breath: Secondary | ICD-10-CM | POA: Diagnosis not present

## 2023-10-31 DIAGNOSIS — R7401 Elevation of levels of liver transaminase levels: Secondary | ICD-10-CM | POA: Diagnosis present

## 2023-10-31 DIAGNOSIS — R54 Age-related physical debility: Secondary | ICD-10-CM | POA: Diagnosis present

## 2023-10-31 LAB — COMPREHENSIVE METABOLIC PANEL
ALT: 96 U/L — ABNORMAL HIGH (ref 0–44)
AST: 62 U/L — ABNORMAL HIGH (ref 15–41)
Albumin: 2.5 g/dL — ABNORMAL LOW (ref 3.5–5.0)
Alkaline Phosphatase: 164 U/L — ABNORMAL HIGH (ref 38–126)
Anion gap: 10 (ref 5–15)
BUN: 39 mg/dL — ABNORMAL HIGH (ref 8–23)
CO2: 19 mmol/L — ABNORMAL LOW (ref 22–32)
Calcium: 9.2 mg/dL (ref 8.9–10.3)
Chloride: 105 mmol/L (ref 98–111)
Creatinine, Ser: 2.02 mg/dL — ABNORMAL HIGH (ref 0.61–1.24)
GFR, Estimated: 31 mL/min — ABNORMAL LOW (ref >60)
Glucose, Bld: 140 mg/dL — ABNORMAL HIGH (ref 70–99)
Potassium: 3.9 mmol/L (ref 3.5–5.1)
Sodium: 134 mmol/L — ABNORMAL LOW (ref 135–145)
Total Bilirubin: 1.4 mg/dL — ABNORMAL HIGH (ref 0.0–1.2)
Total Protein: 5.9 g/dL — ABNORMAL LOW (ref 6.5–8.1)

## 2023-10-31 LAB — CBC WITH DIFFERENTIAL/PLATELET
Abs Immature Granulocytes: 1.12 10*3/uL — ABNORMAL HIGH (ref 0.00–0.07)
Basophils Absolute: 0 10*3/uL (ref 0.0–0.1)
Basophils Relative: 0 %
Eosinophils Absolute: 0 10*3/uL (ref 0.0–0.5)
Eosinophils Relative: 0 %
HCT: 32.2 % — ABNORMAL LOW (ref 39.0–52.0)
Hemoglobin: 10.8 g/dL — ABNORMAL LOW (ref 13.0–17.0)
Immature Granulocytes: 6 %
Lymphocytes Relative: 4 %
Lymphs Abs: 0.7 10*3/uL (ref 0.7–4.0)
MCH: 31.4 pg (ref 26.0–34.0)
MCHC: 33.5 g/dL (ref 30.0–36.0)
MCV: 93.6 fL (ref 80.0–100.0)
Monocytes Absolute: 2.3 10*3/uL — ABNORMAL HIGH (ref 0.1–1.0)
Monocytes Relative: 12 %
Neutro Abs: 14.5 10*3/uL — ABNORMAL HIGH (ref 1.7–7.7)
Neutrophils Relative %: 78 %
Platelets: 67 10*3/uL — ABNORMAL LOW (ref 150–400)
RBC: 3.44 MIL/uL — ABNORMAL LOW (ref 4.22–5.81)
RDW: 14.3 % (ref 11.5–15.5)
Smear Review: DECREASED
WBC: 18.6 10*3/uL — ABNORMAL HIGH (ref 4.0–10.5)
nRBC: 0 % (ref 0.0–0.2)

## 2023-10-31 LAB — URINALYSIS, W/ REFLEX TO CULTURE (INFECTION SUSPECTED)
Bilirubin Urine: NEGATIVE
Glucose, UA: NEGATIVE mg/dL
Ketones, ur: NEGATIVE mg/dL
Nitrite: NEGATIVE
Protein, ur: 100 mg/dL — AB
Specific Gravity, Urine: 1.019 (ref 1.005–1.030)
WBC, UA: >50 WBC/hpf (ref 0–5)
pH: 5 (ref 5.0–8.0)

## 2023-10-31 LAB — RESP PANEL BY RT-PCR (RSV, FLU A&B, COVID)  RVPGX2
Influenza A by PCR: NEGATIVE
Influenza B by PCR: NEGATIVE
Resp Syncytial Virus by PCR: NEGATIVE
SARS Coronavirus 2 by RT PCR: NEGATIVE

## 2023-10-31 LAB — LACTIC ACID, PLASMA: Lactic Acid, Venous: 1.2 mmol/L (ref 0.5–1.9)

## 2023-10-31 MED ORDER — SODIUM CHLORIDE 3 % IN NEBU: 4.0000 mL | INHALATION_SOLUTION | Freq: Two times a day (BID) | RESPIRATORY_TRACT | Status: DC

## 2023-10-31 MED ORDER — ROSUVASTATIN CALCIUM 20 MG PO TABS
20.0000 mg | ORAL_TABLET | Freq: Every day | ORAL | Status: DC
  Administered 2023-11-01 – 2023-11-05 (×5): 20 mg via ORAL
  Filled 2023-10-31 (×5): qty 1

## 2023-10-31 MED ORDER — GUAIFENESIN ER 600 MG PO TB12
1200.0000 mg | ORAL_TABLET | Freq: Two times a day (BID) | ORAL | Status: DC
  Administered 2023-10-31 – 2023-11-05 (×10): 1200 mg via ORAL
  Filled 2023-10-31 (×10): qty 2

## 2023-10-31 MED ORDER — SODIUM CHLORIDE 0.9 % IV SOLN
2.0000 g | Freq: Once | INTRAVENOUS | Status: AC
  Administered 2023-10-31: 2 g via INTRAVENOUS
  Filled 2023-10-31: qty 12.5

## 2023-10-31 MED ORDER — ASPIRIN 81 MG PO TBEC
81.0000 mg | DELAYED_RELEASE_TABLET | Freq: Every day | ORAL | Status: DC
  Administered 2023-11-01 – 2023-11-05 (×5): 81 mg via ORAL
  Filled 2023-10-31 (×5): qty 1

## 2023-10-31 MED ORDER — SODIUM CHLORIDE 0.9 % IV SOLN
2.0000 g | INTRAVENOUS | Status: DC
  Filled 2023-10-31: qty 12.5

## 2023-10-31 MED ORDER — ONDANSETRON HCL 4 MG PO TABS: 4.0000 mg | ORAL_TABLET | Freq: Four times a day (QID) | ORAL | Status: DC | PRN

## 2023-10-31 MED ORDER — VANCOMYCIN HCL 1500 MG/300ML IV SOLN
1500.0000 mg | INTRAVENOUS | Status: DC
  Administered 2023-10-31: 1500 mg via INTRAVENOUS
  Filled 2023-10-31: qty 300

## 2023-10-31 MED ORDER — IPRATROPIUM-ALBUTEROL 0.5-2.5 (3) MG/3ML IN SOLN
3.0000 mL | Freq: Two times a day (BID) | RESPIRATORY_TRACT | Status: DC
  Administered 2023-10-31 – 2023-11-01 (×2): 3 mL via RESPIRATORY_TRACT
  Filled 2023-10-31 (×2): qty 3

## 2023-10-31 MED ORDER — ACETAMINOPHEN 650 MG RE SUPP: 650.0000 mg | Freq: Four times a day (QID) | RECTAL | Status: DC | PRN

## 2023-10-31 MED ORDER — ACETAMINOPHEN 325 MG PO TABS
650.0000 mg | ORAL_TABLET | Freq: Four times a day (QID) | ORAL | Status: DC | PRN
  Administered 2023-10-31 – 2023-11-03 (×5): 650 mg via ORAL
  Filled 2023-10-31 (×5): qty 2

## 2023-10-31 MED ORDER — AMLODIPINE BESYLATE 5 MG PO TABS
2.5000 mg | ORAL_TABLET | Freq: Every day | ORAL | Status: DC
  Administered 2023-10-31 – 2023-11-05 (×6): 2.5 mg via ORAL
  Filled 2023-10-31 (×6): qty 1

## 2023-10-31 MED ORDER — LORATADINE 10 MG PO TABS
10.0000 mg | ORAL_TABLET | Freq: Every day | ORAL | Status: DC
  Administered 2023-10-31 – 2023-11-05 (×6): 10 mg via ORAL
  Filled 2023-10-31 (×6): qty 1

## 2023-10-31 MED ORDER — SODIUM CHLORIDE 0.9 % IV SOLN: INTRAVENOUS | Status: AC

## 2023-10-31 MED ORDER — HYDRALAZINE HCL 20 MG/ML IJ SOLN: 5.0000 mg | INTRAMUSCULAR | Status: DC | PRN

## 2023-10-31 MED ORDER — ALBUTEROL SULFATE (2.5 MG/3ML) 0.083% IN NEBU: 2.5000 mg | INHALATION_SOLUTION | Freq: Four times a day (QID) | RESPIRATORY_TRACT | Status: DC | PRN

## 2023-10-31 MED ORDER — SODIUM CHLORIDE 0.9% FLUSH
3.0000 mL | Freq: Two times a day (BID) | INTRAVENOUS | Status: DC
  Administered 2023-10-31 – 2023-11-05 (×11): 3 mL via INTRAVENOUS

## 2023-10-31 MED ORDER — SODIUM CHLORIDE 3 % IN NEBU
4.0000 mL | INHALATION_SOLUTION | Freq: Two times a day (BID) | RESPIRATORY_TRACT | Status: AC
  Administered 2023-10-31 – 2023-11-03 (×5): 4 mL via RESPIRATORY_TRACT
  Filled 2023-10-31 (×6): qty 4

## 2023-10-31 MED ORDER — ACETAMINOPHEN 500 MG PO TABS
1000.0000 mg | ORAL_TABLET | ORAL | Status: AC
  Administered 2023-10-31: 1000 mg via ORAL
  Filled 2023-10-31: qty 2

## 2023-10-31 MED ORDER — TERAZOSIN HCL 5 MG PO CAPS
10.0000 mg | ORAL_CAPSULE | Freq: Every day | ORAL | Status: DC
  Administered 2023-10-31 – 2023-11-04 (×5): 10 mg via ORAL
  Filled 2023-10-31 (×6): qty 2

## 2023-10-31 MED ORDER — ALBUTEROL SULFATE (2.5 MG/3ML) 0.083% IN NEBU
2.5000 mg | INHALATION_SOLUTION | RESPIRATORY_TRACT | Status: DC | PRN
  Administered 2023-10-31: 2.5 mg via RESPIRATORY_TRACT
  Filled 2023-10-31: qty 3

## 2023-10-31 MED ORDER — ONDANSETRON HCL 4 MG/2ML IJ SOLN: 4.0000 mg | Freq: Four times a day (QID) | INTRAMUSCULAR | Status: DC | PRN

## 2023-10-31 MED ORDER — FINASTERIDE 5 MG PO TABS
5.0000 mg | ORAL_TABLET | Freq: Every day | ORAL | Status: DC
Start: 2023-10-31 — End: 2023-11-05
  Administered 2023-10-31 – 2023-11-05 (×6): 5 mg via ORAL
  Filled 2023-10-31 (×6): qty 1

## 2023-10-31 MED ORDER — LACTATED RINGERS IV BOLUS
1000.0000 mL | Freq: Once | INTRAVENOUS | Status: AC
  Administered 2023-10-31: 1000 mL via INTRAVENOUS

## 2023-10-31 NOTE — ED Notes (Signed)
 Patient transported to CT

## 2023-10-31 NOTE — H&P (Signed)
 History and Physical    Patient: Curtis Jacobs:[id number] DOB: 04-15-1933 DOA: 10/31/2023 DOS: the patient was seen and examined on 10/31/2023 PCP: Plotnikov, [name], MD  Patient coming from: Home via EMS  Chief Complaint: Weakness  HPI: [name] is a 89 y.o. male with medical history significant of hypertension, hyperlipidemia, CAD, BPH presents with complaints of urinary retention and weakness following a recent cystoscopy. He was referred by his family doctor for evaluation of microscopic hematuria.  He underwent a cystoscopy on Monday 3/10 to investigate sediment in the right kidney. Following the procedure, he experienced significant difficulty urinating, describing it as painful and burning, with a sensation of urine stopping abruptly. He initially 'couldn't pee hardly at all.'  By the end of the week, he developed profound weakness, describing himself as 'piddly, so weak' and unable to stand. He does not typically use any assistive devices for mobility.  Denies having any stomach pain or vomiting, though he mentions a change in taste and a brief episode of nausea.  He also makes note that by the end of this week he has been feeling lightheadedness and seemed to feel like he was in a brain fog.  He is currently taking terazosin 10 mg for BPH.  He had initially question if he had gotten sick from someone at the office as there were so many people around him.  In the emergency department patient was noted to have temperature of 100.3 F with tachypnea, and all other vital signs maintained.  Labs significant for WBC 18.6, hemoglobin 10.8, platelets 67, sodium 134, BUN 39, creatinine 2.02, alkaline phosphatase 164, AST 62, ALT 96,  total bilirubin 1.4, and lactic acid 1.2.  Influenza, COVID-19, and RSV screening were negative.  Renal CT noted left renal pelvic calculus measuring 7 x 13 mm with associated urothelial thickening, mild fullness in the left renal collecting system without  frank hydronephrosis, focal caliectasis in the right kidney lower pole, mild prominence of bilateral ureters and mild periureteric fat stranding, cholelithiasis without acute cholecystitis.  Blood and urine cultures were obtained.  Patient was given acetaminophen 1000 mg p.o., 1 L of lactated Ringer's, and cefepime.  Review of Systems: As mentioned in the history of present illness. All other systems reviewed and are negative. Past Medical History:  Diagnosis Date   BPH (benign prostatic hypertrophy)    Dr. Isabel Caprice   CAD (coronary artery disease)    HA (headache)    Traction- Dr. Anne Hahn- had MRI.MRA   HTN (hypertension)    Hyperlipidemia    Past Surgical History:  Procedure Laterality Date   EXCISIONAL HEMORRHOIDECTOMY     LUNG REMOVAL, PARTIAL Right    Social History:  reports that he quit smoking about 71 years ago. His smoking use included cigarettes. He started smoking about 74 years ago. He has a 0.3 pack-year smoking history. He has never used smokeless tobacco. He reports that he does not drink alcohol and does not use drugs.  Allergies  Allergen Reactions   Cordran [Flurandrenolide] Rash    rash   Lisinopril Cough    REACTION: cough    Family History  Problem Relation Age of Onset   Heart disease Father     Prior to Admission medications   Medication Sig Start Date End Date Taking? Authorizing Provider  aspirin EC 81 MG tablet Take 1 tablet (81 mg total) by mouth daily. Swallow whole. 11/18/21  Yes Lewayne Bunting, MD  cetirizine (ZYRTEC) 10 MG tablet Take  1 tablet by mouth daily.   Yes [provider]  ferrous sulfate 324 MG TBEC Take 324 mg by mouth. Nature Made   Yes [provider]  finasteride (PROSCAR) 5 MG tablet Take 5 mg by mouth daily. 10/25/23  Yes [provider]  Multiple Vitamin (MULTIVITAMIN) tablet Take 1 tablet by mouth daily.   Yes [provider]  rosuvastatin (CRESTOR) 20 MG tablet TAKE 1 TABLET(20 MG) BY MOUTH  DAILY 07/22/23  Yes Crenshaw, Madolyn Frieze, MD  terazosin (HYTRIN) 10 MG capsule TAKE 1 CAPSULE BY MOUTH EVERY NIGHT AT BEDTIME. ANNUAL APPT DUE IN [place]. 02/26/23  Yes Plotnikov, [name], MD  amLODipine (NORVASC) 2.5 MG tablet Take 1 tablet (2.5 mg total) by mouth daily. Patient not taking: Reported on 10/31/2023 07/05/23 10/03/23  Ronney Asters, NP  fluocinonide cream (LIDEX) 0.05 % Apply 1 application topically daily. 04/30/16   [provider]  Glucosamine-Chondroit-Vit C-Mn (GLUCOSAMINE CHONDROITIN COMPLX) CAPS Take 1 capsule by mouth daily.    [provider]  ipratropium-albuterol (DUONEB) 0.5-2.5 (3) MG/3ML SOLN Take 3 mLs by nebulization every 6 (six) hours as needed. 12/10/20   Plotnikov, [name], MD  mupirocin ointment Idelle Jo) 2 %  09/27/19   [provider]  Saw Palmetto POWD Take by mouth daily.    [provider]  sodium chloride HYPERTONIC 3 % nebulizer solution Take by nebulization as needed for other. Use this 2 times a day for cough. 09/03/21   Josephine Igo, DO  vitamin C (ASCORBIC ACID) 500 MG tablet Take 500 mg by mouth daily.    [provider]    Physical Exam: Vitals:   10/31/23 1015 10/31/23 1041 10/31/23 1045 10/31/23 1115  BP: (!) 121/57  127/62 125/64  Pulse: 84  81 77  Resp: (!) 25  (!) 28 (!) 32  Temp:  98.7 F (37.1 C)    TempSrc:  Oral    SpO2: 93%  92% 94%  Weight:      Height:       Constitutional: Elderly male who appears ill but in no acute distress at this time. Eyes: PERRL, lids and conjunctivae normal ENMT: Mucous membranes are dry.  Normal dentition.  Neck: normal, supple  Respiratory: Tachypneic with decreased overall aeration and some intermittent crackles appreciated Cardiovascular: Regular rate and rhythm, no murmurs / rubs / gallops. No extremity edema. 2+ pedal pulses. No carotid bruits.  Abdomen: no tenderness, no masses palpated.  Bowel sounds positive.  Musculoskeletal: no clubbing /  cyanosis. No joint deformity upper and lower extremities. Good ROM, no contractures. Normal muscle tone.  Skin: Abrasion noted of the left elbow. Neurologic: CN 2-12 grossly intact.  Strength 4/5 in all 4 extremities Psychiatric: Normal judgment and insight. Alert and oriented x 3. Normal mood.   Data Reviewed:  EKG revealed sinus rhythm  at 94 bpm with premature ventricular complexes.  Reviewed labs, imaging, pertinent records as documented  Assessment and Plan:  Sepsis due to urinary tract infection/pyelonephritis Nephrolithiasis Acute.  On patient presented with complaints of fever documented up to 100.3 F in the ED, tachypnea, and white blood cell count elevated up to 18.6 meeting SIRS criteria.  UA positive for moderate hemoglobin, moderate leukocytes, many bacteria, 21-50 RBC/hpf, and greater than 50 WBCs.  Patient had just underwent cystoscopy on 3/10.  CT of the abdomen pelvis noting left renal pelvic calculus measuring 7 x 13 mm with associated urothelial thickening, mild fullness in the left renal collecting system  without frank hydronephrosis, focal caliectasis in the right kidney lower pole, mild prominence of bilateral ureters and mild periureteric fat stranding.  Blood and urine cultures obtained and patient started on empiric antibiotics of cefepime. -Admit to a telemetry bed -Follow-up blood and urine cultures -Continue empiric antibiotics of cefepime -Tylenol as needed for fever  Acute kidney injury superimposed on chronic kidney disease stage III Creatinine noted to be elevated up to 2.02 with BUN 39.  Baseline creatinine previously noted to be 1.3 on 2/20. -Monitor intake and output -Avoid nephrotoxic agents -Bladder scan -Normal saline IV fluids at 75 mL/h -Recheck kidney function in a.m.  Fall at home Weakness At baseline patient able to ambulate without needed assistance, but since Monday had progressively worsening weakness to the point which he was unable to  stand up.  Patient notes that he had fallen this morning while trying to go to the bathroom and was unable to get up.  Denied any significant injury. -PT to evaluate and treat  Bronchiectasis Pulmonary nodules Patient is followed in the outpatient setting by pulmonology for history of pulmonary nodules and bronchiectasis.  He reports using breathing treatments twice daily.  After getting to the floor patient had some increased work of breathing and was placed on 2 L of nasal cannula oxygen. -Continuous pulse oximetry with nasal cannula oxygen to maintain O2 saturations greater than 90% -Incentive spirometry and flutter valve -DuoNebs twice daily and albuterol nebs as needed -Hypertonic saline nebs twice daily -Mucinex -Continue outpatient follow-up with pulmonology  Elevated liver function test Acute.  Alkaline phosphatase 164, AST 62, ALT 96, and total bilirubin 1.4.  Heart reactive to above. -Recheck LFTs in a.m.  Pancytopenia Acute on chronic. On admission labs noted hemoglobin 10.8 with platelet count 67.  Baseline hemoglobin previously had been around 11-12 with platelets 90-110s.  Blood was present in patient's urine. -Return CBC in a.m.  Essential hypertension Blood pressures elevated up to 183/85.  Had not been taking amlodipine 2.5 mg daily recently due to low blood pressures. -Resume amlodipine -Low-dose hydralazine be as needed  Dyslipidemia -Consider resuming Crestor tomorrow if LFTs  BPH -Continue terazosin finasteride  DVT prophylaxis: SCDs Advance Care Planning:   Code Status: Full Code    Consults: None  Family Communication: None requested  Severity of Illness: The appropriate patient status for this patient is INPATIENT. Inpatient status is judged to be reasonable and necessary in order to provide the required intensity of service to ensure the patient's safety. The patient's presenting symptoms, physical exam findings, and initial radiographic and laboratory  data in the context of their chronic comorbidities is felt to place them at high risk for further clinical deterioration. Furthermore, it is not anticipated that the patient will be medically stable for discharge from the hospital within 2 midnights of admission.   * I certify that at the point of admission it is my clinical judgment that the patient will require inpatient hospital care spanning beyond 2 midnights from the point of admission due to high intensity of service, high risk for further deterioration and high frequency of surveillance required.*  Author: Clydie Braun, MD 10/31/2023 11:38 AM  For on call review [email or web address].

## 2023-10-31 NOTE — Progress Notes (Signed)
 Pharmacy Antibiotic Note  Curtis Jacobs is a 89 y.o. male admitted on 10/31/2023 with sepsis and UTI.  Pharmacy has been consulted for vancomycin dosing.  Plan: Vancomycin 1500mg  IV q48h for estimated AUC 463 using SCr 2.02, Vd 0.72 Check vancomycin levels at steady state, goal AUC 400-550 Cefepime 2g IV q24h per MD Follow up renal function, cultures as available, clinical progress, length of tx   Height: 5\' 10"  (177.8 cm) Weight: 86.2 kg (190 lb) IBW/kg (Calculated) : 73  Temp (24hrs), Avg:101 F (38.3 C), Min:98.7 F (37.1 C), Max:103.5 F (39.7 C)  Recent Labs  Lab 10/31/23 0858 10/31/23 0915  WBC 18.6*  --   CREATININE 2.02*  --   LATICACIDVEN  --  1.2    Estimated Creatinine Clearance: 26.1 mL/min (A) (by C-G formula based on SCr of 2.02 mg/dL (H)).    Allergies  Allergen Reactions   Cordran [Flurandrenolide] Rash    rash   Lisinopril Cough    REACTION: cough    Antimicrobials this admission: Cefepime 3/16 >> Vancomycin 3/16 >>  Dose adjustments this admission:  Microbiology results: 3/16 BCx: 3/16 UCx: 3/16 MRSA PCR:  Thank you for allowing pharmacy to be a part of this patient's care.  Loralee Pacas, PharmD, BCPS 10/31/2023 5:24 PM  Please check AMION for all [facility] phone numbers After 10:00 PM, call Main Pharmacy [phone]

## 2023-10-31 NOTE — Consult Note (Signed)
 I have been asked to see the patient by Dr. Madelyn Flavors, for evaluation and management of UTI/Pyelonephritis renal stones.  History of present illness: 89 year old male presents to the emergency department with progressive weakness, difficulty with urination and dysuria, fever, chills, cough.  He is not having any associated pain.  He does have chronic back pain but that has not progressed or worsened.  The patient was seen in the urology clinic 6 days prior.  He had cystoscopy for further evaluation of his microscopic hematuria.  This demonstrated a stone within his bladder, and enlarged prostate with intra vesicle protrusion of the median lobe.  At the time of his cystoscopic evaluation his urine analysis was normal.  Over the course of the next 5 days he developed significant dysuria, frequency, urgency, and weakness.  He has also been having more cough and shortness of breath.  In the ER the patient underwent a CT scan of the abdomen and pelvis.  This demonstrated a nonobstructing stone in the left kidney and a bladder stone.  There was some stranding around the left kidney.  This was compared to his scan performed nearly 3 weeks prior which was largely unchanged.  Patient's urinalysis was noted to be consistent with infection.  In addition, his renal function was noted to be worsened and his white blood cell count significantly higher.  Review of systems: A 12 point comprehensive review of systems was obtained and is negative unless otherwise stated in the history of present illness.  Patient Active Problem List   Diagnosis Date Noted   Sepsis (HCC) 10/31/2023   Microscopic hematuria 04/11/2023   Anemia 04/11/2023   Trigger finger of right thumb 10/29/2022   Aortic ectasia, thoracic (HCC) 09/07/2022   Right carpal tunnel syndrome 07/03/2021   Coronary atherosclerosis 02/26/2021   Cough 12/10/2020   Intertriginous candidiasis 05/23/2020   Osteoarthritis 02/06/2020   Malignant  melanoma of ear (HCC) 08/08/2019   Skin lesions 10/18/2018   Low back pain 01/27/2018   RBBB (right bundle branch block) 01/14/2016   Eczema 01/04/2014   Trigger finger, acquired 01/04/2014   Bronchiectasis without acute exacerbation (HCC) 07/25/2013   Multiple pulmonary nodules 07/08/2013   Dyslipidemia 06/17/2012   Well adult exam 06/17/2012   Dizziness 03/22/2012   Thigh abscess 02/10/2012   Shoulder pain 02/10/2012   Neck pain, chronic 04/07/2011   PSA, INCREASED 10/06/2010   Neoplasm of uncertain behavior of skin 08/19/2010   Bilateral carotid bruits 08/19/2010   ELECTROCARDIOGRAM, ABNORMAL 06/14/2009   TOBACCO USE, QUIT 06/07/2009   Rhinitis 01/30/2009   Headache(784.0) 01/30/2009   Essential hypertension 03/26/2008   UPPER RESPIRATORY INFECTION (URI) 03/26/2008   History of colonic polyps 12/23/2007   Other and unspecified hyperlipidemia 03/18/2007   BPH (benign prostatic hyperplasia) 03/18/2007    No current facility-administered medications on file prior to encounter.   Current Outpatient Medications on File Prior to Encounter  Medication Sig Dispense Refill   aspirin EC 81 MG tablet Take 1 tablet (81 mg total) by mouth daily. Swallow whole. 90 tablet 3   cetirizine (ZYRTEC) 10 MG tablet Take 1 tablet by mouth daily.     ferrous sulfate 324 MG TBEC Take 324 mg by mouth. Nature Made     finasteride (PROSCAR) 5 MG tablet Take 5 mg by mouth daily.     fluocinonide cream (LIDEX) 0.05 % Apply 1 application topically daily.     Multiple Vitamin (MULTIVITAMIN) tablet Take 1 tablet by mouth daily.  rosuvastatin (CRESTOR) 20 MG tablet TAKE 1 TABLET(20 MG) BY MOUTH DAILY 90 tablet 3   terazosin (HYTRIN) 10 MG capsule TAKE 1 CAPSULE BY MOUTH EVERY NIGHT AT BEDTIME. ANNUAL APPT DUE IN [place]. 90 capsule 3   amLODipine (NORVASC) 2.5 MG tablet Take 1 tablet (2.5 mg total) by mouth daily. (Patient not taking: Reported on 10/31/2023) 90 tablet 3    Past Medical History:   Diagnosis Date   BPH (benign prostatic hypertrophy)    Dr. Isabel Caprice   CAD (coronary artery disease)    HA (headache)    Traction- Dr. Anne Hahn- had MRI.MRA   HTN (hypertension)    Hyperlipidemia     Past Surgical History:  Procedure Laterality Date   EXCISIONAL HEMORRHOIDECTOMY     LUNG REMOVAL, PARTIAL Right     Social History   Tobacco Use   Smoking status: Former    Current packs/day: 0.00    Average packs/day: 0.1 packs/day for 3.0 years (0.3 ttl pk-yrs)    Types: Cigarettes    Start date: 07/25/1949    Quit date: 07/25/1952    Years since quitting: 71.3   Smokeless tobacco: Never   Tobacco comments:    used to "smoke but not inhale"  Substance Use Topics   Alcohol use: No   Drug use: No    Family History  Problem Relation Age of Onset   Heart disease Father     PE: Vitals:   10/31/23 1041 10/31/23 1045 10/31/23 1115 10/31/23 1130  BP:  127/62 125/64 124/65  Pulse:  81 77 75  Resp:  (!) 28 (!) 32 (!) 27  Temp: 98.7 F (37.1 C)     TempSrc: Oral     SpO2:  92% 94% 95%  Weight:      Height:       Patient appears to be in no mild distress  patient is alert and oriented x3 Atraumatic normocephalic head No cervical or supraclavicular lymphadenopathy appreciated Significant increased work of breathing, productive cough  Regular sinus rhythm/rate Abdomen is soft, nontender, nondistended, no CVA or suprapubic tenderness Lower extremities are symmetric without appreciable edema Grossly neurologically intact No identifiable skin lesions  Recent Labs    10/31/23 0858  WBC 18.6*  HGB 10.8*  HCT 32.2*   Recent Labs    10/31/23 0858  NA 134*  K 3.9  CL 105  CO2 19*  GLUCOSE 140*  BUN 39*  CREATININE 2.02*  CALCIUM 9.2   No results for input(s): "LABPT", "INR" in the last 72 hours. No results for input(s): "LABURIN" in the last 72 hours. Results for orders placed or performed during the hospital encounter of 10/31/23  Resp panel by RT-PCR (RSV,  Flu A&B, Covid) Anterior Nasal Swab     Status: None   Collection Time: 10/31/23  8:57 AM   Specimen: Anterior Nasal Swab  Result Value Ref Range Status   SARS Coronavirus 2 by RT PCR NEGATIVE NEGATIVE Final   Influenza A by PCR NEGATIVE NEGATIVE Final   Influenza B by PCR NEGATIVE NEGATIVE Final    Comment: (NOTE) The Xpert Xpress SARS-CoV-2/FLU/RSV plus assay is intended as an aid in the diagnosis of influenza from Nasopharyngeal swab specimens and should not be used as a sole basis for treatment. Nasal washings and aspirates are unacceptable for Xpert Xpress SARS-CoV-2/FLU/RSV testing.  Fact Sheet for Patients: BloggerCourse.com  Fact Sheet for Healthcare Providers: SeriousBroker.it  This test is not yet approved or cleared by the [place] and  has been authorized for detection and/or diagnosis of SARS-CoV-2 by FDA under an Emergency Use Authorization (EUA). This EUA will remain in effect (meaning this test can be used) for the duration of the COVID-19 declaration under Section 564(b)(1) of the Act, 21 U.S.C. section 360bbb-3(b)(1), unless the authorization is terminated or revoked.     Resp Syncytial Virus by PCR NEGATIVE NEGATIVE Final    Comment: (NOTE) Fact Sheet for Patients: BloggerCourse.com  Fact Sheet for Healthcare Providers: SeriousBroker.it  This test is not yet approved or cleared by the [place] FDA and has been authorized for detection and/or diagnosis of SARS-CoV-2 by FDA under an Emergency Use Authorization (EUA). This EUA will remain in effect (meaning this test can be used) for the duration of the COVID-19 declaration under Section 564(b)(1) of the Act, 21 U.S.C. section 360bbb-3(b)(1), unless the authorization is terminated or revoked.  Performed at [facility], [address] [place], [place] [address]     Imaging: I reviewed  the patient's CT scan with the findings as noted in the HPI.  He has stone in the left UPJ, but I do not think that this is contributing to his symptoms.  He has no significant worsening of the hydronephrosis on that side.  He also has a stone within the bladder, this was present about a month ago.  Bladder scan: The patient had a bladder scan just prior to him voiding.  I performed this myself, the patient was noted to have 360 mL in his bladder.  He then subsequently voided a moderate amount.  Imp:  #1: The patient has developed a UTI, unlikely pyelonephritis with potential bacteremia presumably from cystoscopy performed about a week ago.   #2: He may also have developed an upper respiratory tract infection which may be unrelated. #3: I do not think the patient's stone in the left UPJ or in the bladder are contributing to his symptoms today.  Recommendations: This point the patient needs broad-spectrum antibiotics and respiratory support.  We can narrow his antibiotics once the cultures return.  I do not think the none obstructing kidney stone or the bladder stone is contributing to the patient's symptoms.  If he decompensates despite broad-spectrum antibiotics then we will consider placing a stent in his left ureter.  We will continue to follow.  Thank you for involving me in this patient's care, I will continue to follow along.  Crist Fat

## 2023-10-31 NOTE — ED Notes (Signed)
MD Paterson at bedside. 

## 2023-10-31 NOTE — ED Provider Notes (Signed)
 [place] EMERGENCY DEPARTMENT AT [facility] Provider Note   CSN: [id number] Arrival date & time: 10/31/23  4132     History  No chief complaint on file.   Curtis Jacobs is a 89 y.o. male.  89 year old male with a history of CAD on aspirin and hyperlipidemia who presents to the emergency department with chills, weakness, and dysuria.  On Monday patient had a cystoscopy for evaluation of microscopic hematuria.  Shortly afterwards started feeling sick.  Has been having subjective fevers and chills.  Also has been having urinary hesitancy and dysuria.  Denies any flank pain or abdominal pain.  No objective fevers at home.  Was told by urology at that appointment that he had a kidney stone as well.  EMS stated that the patient had vomiting and diarrhea but I clarified with the patient and his wife that this was not the case.       Home Medications Prior to Admission medications   Medication Sig Start Date End Date Taking? Authorizing Provider  aspirin EC 81 MG tablet Take 1 tablet (81 mg total) by mouth daily. Swallow whole. 11/18/21  Yes Lewayne Bunting, MD  cetirizine (ZYRTEC) 10 MG tablet Take 1 tablet by mouth daily.   Yes [provider]  ferrous sulfate 324 MG TBEC Take 324 mg by mouth. Nature Made   Yes [provider]  finasteride (PROSCAR) 5 MG tablet Take 5 mg by mouth daily. 10/25/23  Yes [provider]  fluocinonide cream (LIDEX) 0.05 % Apply 1 application topically daily. 04/30/16  Yes [provider]  Multiple Vitamin (MULTIVITAMIN) tablet Take 1 tablet by mouth daily.   Yes [provider]  rosuvastatin (CRESTOR) 20 MG tablet TAKE 1 TABLET(20 MG) BY MOUTH DAILY 07/22/23  Yes Crenshaw, Madolyn Frieze, MD  terazosin (HYTRIN) 10 MG capsule TAKE 1 CAPSULE BY MOUTH EVERY NIGHT AT BEDTIME. ANNUAL APPT DUE IN [place]UG[place]. 02/26/23  Yes Plotnikov, [name], MD  amLODipine (NORVASC) 2.5 MG tablet Take 1 tablet (2.5 mg total) by mouth  daily. Patient not taking: Reported on 10/31/2023 07/05/23 10/03/23  Ronney Asters, NP      Allergies    Cordran [flurandrenolide] and Lisinopril    Review of Systems   Review of Systems  Physical Exam Updated Vital Signs BP 124/65   Pulse 75   Temp 98.7 F (37.1 C) (Oral)   Resp (!) 27   Ht 5\' 10"  (1.778 m)   Wt 86.2 kg   SpO2 95%   BMI 27.26 kg/m  Physical Exam Vitals and nursing note reviewed.  Constitutional:      General: He is not in acute distress.    Appearance: He is well-developed.  HENT:     Head: Normocephalic and atraumatic.     Right Ear: External ear normal.     Left Ear: External ear normal.     Nose: Nose normal.  Eyes:     Extraocular Movements: Extraocular movements intact.     Conjunctiva/sclera: Conjunctivae normal.     Pupils: Pupils are equal, round, and reactive to light.  Cardiovascular:     Rate and Rhythm: Normal rate and regular rhythm.     Heart sounds: Normal heart sounds.  Pulmonary:     Effort: Pulmonary effort is normal. No respiratory distress.     Breath sounds: Normal breath sounds.  Abdominal:     General: There is no distension.     Palpations: Abdomen is soft. There is no  mass.     Tenderness: There is no abdominal tenderness. There is no right CVA tenderness, left CVA tenderness or guarding.  Musculoskeletal:     Cervical back: Normal range of motion and neck supple.     Right lower leg: No edema.     Left lower leg: No edema.  Skin:    General: Skin is warm and dry.  Neurological:     Mental Status: He is alert. Mental status is at baseline.  Psychiatric:        Mood and Affect: Mood normal.        Behavior: Behavior normal.     ED Results / Procedures / Treatments   Labs (all labs ordered are listed, but only abnormal results are displayed) Labs Reviewed  COMPREHENSIVE METABOLIC PANEL - Abnormal; Notable for the following components:      Result Value   Sodium 134 (*)    CO2 19 (*)    Glucose, Bld 140 (*)     BUN 39 (*)    Creatinine, Ser 2.02 (*)    Total Protein 5.9 (*)    Albumin 2.5 (*)    AST 62 (*)    ALT 96 (*)    Alkaline Phosphatase 164 (*)    Total Bilirubin 1.4 (*)    GFR, Estimated 31 (*)    All other components within normal limits  CBC WITH DIFFERENTIAL/PLATELET - Abnormal; Notable for the following components:   WBC 18.6 (*)    RBC 3.44 (*)    Hemoglobin 10.8 (*)    HCT 32.2 (*)    Platelets 67 (*)    Neutro Abs 14.5 (*)    Monocytes Absolute 2.3 (*)    Abs Immature Granulocytes 1.12 (*)    All other components within normal limits  URINALYSIS, W/ REFLEX TO CULTURE (INFECTION SUSPECTED) - Abnormal; Notable for the following components:   Color, Urine AMBER (*)    APPearance CLOUDY (*)    Hgb urine dipstick MODERATE (*)    Protein, ur 100 (*)    Leukocytes,Ua MODERATE (*)    Bacteria, UA MANY (*)    All other components within normal limits  RESP PANEL BY RT-PCR (RSV, FLU A&B, COVID)  RVPGX2  CULTURE, BLOOD (ROUTINE X 2)  CULTURE, BLOOD (ROUTINE X 2)  URINE CULTURE  LACTIC ACID, PLASMA    EKG EKG Interpretation Date/Time:  Sunday October 31 2023 08:46:20 EDT Ventricular Rate:  94 PR Interval:  13 QRS Duration:  149 QT Interval:  371 QTC Calculation: 464 R Axis:   -69  Text Interpretation: Sinus tachycardia Premature ventricular complexes Short PR interval Biatrial enlargement RBBB and LAFB Confirmed by Vonita Moss [phone]) on 10/31/2023 9:41:07 AM  Radiology CT Renal Stone Study Result Date: 10/31/2023 CLINICAL DATA:  Told by urology that he had a kidney stone. Urosepsis. Recent cystoscopy. EXAM: CT ABDOMEN AND PELVIS WITHOUT CONTRAST TECHNIQUE: Multidetector CT imaging of the abdomen and pelvis was performed following the standard protocol without IV contrast. RADIATION DOSE REDUCTION: This exam was performed according to the departmental dose-optimization program which includes automated exposure control, adjustment of the mA and/or kV according to  patient size and/or use of iterative reconstruction technique. COMPARISON:  CT scan abdomen and pelvis from 10/14/2023. FINDINGS: Lower chest: There are subpleural atelectatic changes in the visualized lung bases. No overt consolidation. No pleural effusion. The heart is normal in size. No pericardial effusion. Hepatobiliary: The liver is normal in size. Non-cirrhotic configuration. No suspicious mass. These is  mild diffuse hepatic steatosis. No intrahepatic or extrahepatic bile duct dilation. Small volume calcified gallstones noted without imaging signs of acute cholecystitis. Normal gallbladder wall thickness. No pericholecystic inflammatory changes. Pancreas: Unremarkable. No pancreatic ductal dilatation or surrounding inflammatory changes. Spleen: Within normal limits. No focal lesion. Adrenals/Urinary Tract: Adrenal glands are unremarkable. No suspicious renal mass within the limitations of this unenhanced exam. Redemonstration of 7 x 13 mm left renal pelvic stone noted with significant associated urothelial thickening. There is mild fullness in the left renal collecting system without frank hydronephrosis. There is a stable 3 x 7 mm nonobstructing calculus in the left kidney lower pole calyx. No left ureterolithiasis. However, there is mild prominence of left ureter and mild left periureteric fat stranding, which is new since the prior study and may represent superimposed urinary tract infection/ureteritis. There is a 3 mm nonobstructing calculus in the right kidney lower pole calyx. No right ureterolithiasis. There is mild right periureteric fat stranding as well, favoring superimposed urinary tract infection. Mild right kidney lower pole caliectasis. However, no frank right hydroureteronephrosis. Redemonstration of a probable mobile 8 x 10 mm calculus in the posterior portion of urinary bladder, which appears in different position when compared to the recent prior exam from 10/14/2023. Urinary bladder is  otherwise unremarkable. No focal mass or perivesical fat stranding there is interval clearing of previously noted smaller bladder calculus. Stomach/Bowel: No disproportionate dilation of the small or large bowel loops. No evidence of abnormal bowel wall thickening or inflammatory changes. The appendix was not visualized; however there is no acute inflammatory process in the right lower quadrant. There are scattered diverticula mainly in the sigmoid colon, without imaging signs of diverticulitis. Vascular/Lymphatic: No ascites or pneumoperitoneum. No abdominal or pelvic lymphadenopathy, by size criteria. No aneurysmal dilation of the major abdominal arteries. There are moderate peripheral atherosclerotic vascular calcifications of the aorta and its major branches. Reproductive: Enlarged prostate. Symmetric seminal vesicles. Other: The visualized soft tissues and abdominal wall are unremarkable. Musculoskeletal: No suspicious osseous lesions. There are mild - moderate multilevel degenerative changes in the visualized spine. IMPRESSION: 1. Redemonstration of left renal pelvic calculus (7 x 13 mm) with associated urothelial thickening. There is mild fullness in the left renal collecting system without frank hydronephrosis. There is focal caliectasis in the right kidney lower pole. Mild prominence of bilateral ureters and mild periureteric fat stranding, which is new since the prior study and may represent superimposed urinary tract infection/ureteritis. Correlate clinically and with urinalysis. 2. There is a 8 x 10 mm calculus in the urinary bladder. Previously seen other smaller calculus has resolved in the interim. 3. Multiple other nonacute observations (such as cholelithiasis without acute cholecystitis, colonic diverticula, prostatomegaly, etc.), As described above. 4. Aortic atherosclerosis. Aortic Atherosclerosis (ICD10-I70.0). Electronically Signed   By: Jules Schick M.D.   On: 10/31/2023 11:08     Procedures Procedures    Medications Ordered in ED Medications  ceFEPIme (MAXIPIME) 2 g in sodium chloride 0.9 % 100 mL IVPB (0 g Intravenous Stopped 10/31/23 1004)  acetaminophen (TYLENOL) tablet 1,000 mg (1,000 mg Oral Given 10/31/23 0926)  lactated ringers bolus 1,000 mL (0 mLs Intravenous Stopped 10/31/23 1038)    ED Course/ Medical Decision Making/ A&P Clinical Course as of 10/31/23 1143  Sun Oct 31, 2023  0954 Bladder scan with 210 ml [RP]  1025 Creatinine(!): 2.02 Baseline 1.3 [RP]  1048 Dr Marlou Porch from urology consulted. Feels that patient does not need a stent at this time.  [RP]  1139  Dr Katrinka Blazing from hospitalist consulted for admission.  [RP]    Clinical Course User Index [RP] Rondel Baton, MD                                 Medical Decision Making Amount and/or Complexity of Data Reviewed Labs: ordered. Decision-making details documented in ED Course. Radiology: ordered.  Risk OTC drugs. Decision regarding hospitalization.   Curtis Jacobs is a 89 y.o. male with comorbidities that complicate the patient evaluation including CAD on aspirin and hyperlipidemia who presents to the emergency department with chills, weakness, and dysuria.   Initial Ddx:  Pyelonephritis, infected stone, urinary retention, sepsis  MDM/Course:  Patient presents to the emergency department with systemic symptoms after having cystoscopy.  Also is having some urinary hesitancy and dysuria.  On exam has a temperature of 100.3.  No significant CVA tenderness or flank pain that he is come in complaining about.  Was found to have a UTI based on his lab work as well as an AKI.  White count of 18.  Sepsis protocol initiated and was started on cefepime due to the fact that he is at increased risk for healthcare associated pathogens with his recent cystoscopy.  Urine culture sent.  Upon re-evaluation was stable.  Urology felt that no acute intervention was required.  Given IV fluids and admitted  to hospitalist for further management.  This patient presents to the ED for concern of complaints listed in HPI, this involves an extensive number of treatment options, and is a complaint that carries with it a high risk of complications and morbidity. Disposition including potential need for admission considered.   Dispo: Admit to Floor  Additional history obtained from spouse Records reviewed Outpatient Clinic Notes The following labs were independently interpreted: Urinalysis and show urinary tract infection I independently reviewed the following imaging with scope of interpretation limited to determining acute life threatening conditions related to emergency care: CT Abdomen/Pelvis and agree with the radiologist interpretation with the following exceptions: none I personally reviewed and interpreted cardiac monitoring: normal sinus rhythm  I personally reviewed and interpreted the pt's EKG: see above for interpretation  I have reviewed the patients home medications and made adjustments as needed Consults: Hospitalist and Urology Social Determinants of health:  Geriatric  Portions of this note were generated with Scientist, clinical (histocompatibility and immunogenetics). Dictation errors may occur despite best attempts at proofreading.     Final Clinical Impression(s) / ED Diagnoses Final diagnoses:  Pyelonephritis  Sepsis, due to unspecified organism, unspecified whether acute organ dysfunction present [facility])    Rx / DC Orders ED Discharge Orders     None         Rondel Baton, MD 10/31/23 1143

## 2023-10-31 NOTE — Progress Notes (Signed)
   10/31/23 1525  Assess: MEWS Score  Temp (!) 101.5 F (38.6 C)  BP (!) 145/67  MAP (mmHg) 88  Pulse Rate 87  ECG Heart Rate 92  Resp (!) 33  SpO2 93 %  O2 Device Nasal Cannula  O2 Flow Rate (L/min) (S)  2 L/min  Assess: MEWS Score  MEWS Temp 2  MEWS Systolic 0  MEWS Pulse 0  MEWS RR 2  MEWS LOC 0  MEWS Score 4  MEWS Score Color Red  Assess: if the MEWS score is Yellow or Red  Were vital signs accurate and taken at a resting state? Yes  Does the patient meet 2 or more of the SIRS criteria? Yes  Does the patient have a confirmed or suspected source of infection? Yes  Notify: Charge Nurse/RN  Name of Charge Nurse/RN Notified Secondary school teacher Name/Title MD R. Katrinka Blazing  Date Provider Notified 10/31/23  Time Provider Notified 1526  Method of Notification Page  Notification Reason Change in status  Provider response See new orders  Date of Provider Response 10/31/23  Time of Provider Response 1527  Assess: SIRS CRITERIA  SIRS Temperature  1  SIRS Respirations  1  SIRS Pulse 1  SIRS WBC 1  SIRS Score Sum  4

## 2023-10-31 NOTE — ED Triage Notes (Signed)
 Pt BIB [facility] from home due to one week of nausea, vomiting and diarrhea.  Pt reports he went to PCP and they stated he had kidney stones.  Over the last week he has been getting weaker and when he slid down in bathroom to his bottom "could not get back up."  Hx COPD and high cholesterol.  20g left AC.   VS BP 119/70, HR 90, CBG 16, Resp 30

## 2023-10-31 NOTE — ED Notes (Addendum)
 Bladder scan 210 when urinated afterwards output 50 ml

## 2023-10-31 NOTE — Sepsis Progress Note (Signed)
 Sepsis protocol is being followed by [facility].

## 2023-11-01 DIAGNOSIS — N200 Calculus of kidney: Secondary | ICD-10-CM | POA: Diagnosis not present

## 2023-11-01 DIAGNOSIS — N179 Acute kidney failure, unspecified: Secondary | ICD-10-CM | POA: Diagnosis not present

## 2023-11-01 DIAGNOSIS — W19XXXA Unspecified fall, initial encounter: Secondary | ICD-10-CM | POA: Diagnosis not present

## 2023-11-01 DIAGNOSIS — A419 Sepsis, unspecified organism: Secondary | ICD-10-CM | POA: Diagnosis not present

## 2023-11-01 LAB — COMPREHENSIVE METABOLIC PANEL
ALT: 70 U/L — ABNORMAL HIGH (ref 0–44)
AST: 38 U/L (ref 15–41)
Albumin: 2.1 g/dL — ABNORMAL LOW (ref 3.5–5.0)
Alkaline Phosphatase: 143 U/L — ABNORMAL HIGH (ref 38–126)
Anion gap: 8 (ref 5–15)
BUN: 43 mg/dL — ABNORMAL HIGH (ref 8–23)
CO2: 19 mmol/L — ABNORMAL LOW (ref 22–32)
Calcium: 8.7 mg/dL — ABNORMAL LOW (ref 8.9–10.3)
Chloride: 110 mmol/L (ref 98–111)
Creatinine, Ser: 2.13 mg/dL — ABNORMAL HIGH (ref 0.61–1.24)
GFR, Estimated: 29 mL/min — ABNORMAL LOW (ref >60)
Glucose, Bld: 117 mg/dL — ABNORMAL HIGH (ref 70–99)
Potassium: 4.2 mmol/L (ref 3.5–5.1)
Sodium: 137 mmol/L (ref 135–145)
Total Bilirubin: 1.1 mg/dL (ref 0.0–1.2)
Total Protein: 5.1 g/dL — ABNORMAL LOW (ref 6.5–8.1)

## 2023-11-01 LAB — BLOOD CULTURE ID PANEL (REFLEXED) - BCID2

## 2023-11-01 LAB — CBC
HCT: 28 % — ABNORMAL LOW (ref 39.0–52.0)
Hemoglobin: 9.3 g/dL — ABNORMAL LOW (ref 13.0–17.0)
MCH: 31.1 pg (ref 26.0–34.0)
MCHC: 33.2 g/dL (ref 30.0–36.0)
MCV: 93.6 fL (ref 80.0–100.0)
Platelets: 66 10*3/uL — ABNORMAL LOW (ref 150–400)
RBC: 2.99 MIL/uL — ABNORMAL LOW (ref 4.22–5.81)
RDW: 14.6 % (ref 11.5–15.5)
WBC: 20.5 10*3/uL — ABNORMAL HIGH (ref 4.0–10.5)
nRBC: 0 % (ref 0.0–0.2)

## 2023-11-01 MED ORDER — LACTATED RINGERS IV SOLN: INTRAVENOUS | Status: DC

## 2023-11-01 MED ORDER — POLYVINYL ALCOHOL 1.4 % OP SOLN: 1.0000 [drp] | OPHTHALMIC | Status: DC | PRN

## 2023-11-01 MED ORDER — IPRATROPIUM-ALBUTEROL 0.5-2.5 (3) MG/3ML IN SOLN
3.0000 mL | Freq: Four times a day (QID) | RESPIRATORY_TRACT | Status: DC
  Administered 2023-11-01 – 2023-11-03 (×5): 3 mL via RESPIRATORY_TRACT
  Filled 2023-11-01 (×5): qty 3

## 2023-11-01 MED ORDER — SODIUM CHLORIDE 0.9 % IV SOLN
2.0000 g | INTRAVENOUS | Status: DC
  Administered 2023-11-01: 2 g via INTRAVENOUS
  Filled 2023-11-01: qty 20

## 2023-11-01 NOTE — Assessment & Plan Note (Signed)
-  Continue home terazosin, finasteride

## 2023-11-01 NOTE — Assessment & Plan Note (Signed)
-  Holding Crestor due to mild transaminitis

## 2023-11-01 NOTE — Assessment & Plan Note (Signed)
 Likely secondary to sepsis, creatinine still at 2.13 with baseline around 1.3. -Giving some gentle IV fluid -Monitor renal function -Avoid nephrotoxins

## 2023-11-01 NOTE — Plan of Care (Signed)

## 2023-11-01 NOTE — Progress Notes (Signed)
 Progress Note   Patient: Curtis Jacobs WGN:[id number] DOB: 1933/04/05 DOA: 10/31/2023     1 DOS: the patient was seen and examined on 11/01/2023   Brief hospital course: Taken from H&P.   Curtis Jacobs is a 89 y.o. male with medical history significant of hypertension, hyperlipidemia, CAD, BPH presents with complaints of urinary retention and weakness following a recent cystoscopy. He was referred by his family doctor for evaluation of microscopic hematuria.  Patient has cystoscopy on Monday, 3/10.  On presentation patient was febrile and tachypneic.  Labs with leukocytosis at 18.6, hemoglobin 10.8, platelets 67, sodium 134, creatinine 2.02, alkaline phosphatase 164, AST 62, ALT 96,  total bilirubin 1.4, and lactic acid 1.2.  Influenza, COVID-19, and RSV screening were negative.  Renal CT noted left renal pelvic calculus measuring 7 x 13 mm with associated urothelial thickening, mild fullness in the left renal collecting system without frank hydronephrosis, focal caliectasis in the right kidney lower pole, mild prominence of bilateral ureters and mild periureteric fat stranding, cholelithiasis without acute cholecystitis.  Blood and urine cultures were obtained.    Patient was given IV fluid and started on broad-spectrum antibiotics per sepsis protocol.  Urology was consulted but they do not think that his left UPJ is or bladder stone is contributing to current symptoms.  3/17: Vital stable and afebrile this morning, maximum temperature recorded was 103.5,Doing labs with slight worsening of leukocytosis at 20.5, hemoglobin 9.3, platelets 66.  CO2 19, BUN 43, creatinine 2.13, improving transaminitis, blood cultures growing E. coli-pending susceptibility, urine cultures pending.  Giving some more IV fluid. Antibiotics narrowed down to ceftriaxone.  Assessment and Plan: * Sepsis due to urinary tract infection (HCC) Nephrolithiasis. Blood cultures growing E. coli-pending susceptibility.  Urine  cultures pending.  History of recent cystoscopy. CT with some left renal pelvic and bladder calculi-urology is on board and does not think that current illness is related to that as there is no obstruction.  Also concern of pyelonephritis with mild periureteric and renal stranding. -Antibiotics switched to ceftriaxone-follow-up final culture results -Continue with supportive care  Acute kidney injury superimposed on chronic kidney disease (HCC) Likely secondary to sepsis, creatinine still at 2.13 with baseline around 1.3. -Giving some gentle IV fluid -Monitor renal function -Avoid nephrotoxins  Fall at home, initial encounter At baseline patient able to ambulate without needed assistance, but since Monday had progressively worsening weakness to the point which he was unable to stand up.  Patient notes that he had fallen this morning while trying to go to the bathroom and was unable to get up.  Denied any significant injury. -PT to evaluate and treat-recommending home health  Bronchiectasis without acute exacerbation (HCC) Patient was having some worsening cough and mucus production. No wheezing. -Continue with bronchodilators -Continue with hypertonic saline nebulizer twice daily for total of 3 days -Continue with supportive care -Incentive spirometry and flutter valve  Multiple pulmonary nodules -Outpatient follow-up with pulmonology  Elevated liver function tests Improving.  TLudwig Clarks has been normalized -Continue to monitor  Pancytopenia (HCC) Acute on chronic.  Baseline hemoglobin previously had been around 11-12 with platelets 90-110s.  Current hemoglobin of 9.3 and platelet of 66.  Blood was present in patient's urine. -Monitor hemoglobin -Transfuse if below 7  Essential hypertension Blood pressure mildly elevated. -Continue with amlodipine -As needed hydralazine  Dyslipidemia -Holding Crestor due to mild transaminitis  BPH (benign prostatic hyperplasia) -Continue home  terazosin, finasteride   Subjective: Patient was seen and examined today.  He was complaining of worsening cough and sputum production stating that he wants this sputum to get out, multiple similar episodes and per patient cough always improves once he cannot cough out his phlegm.  Physical Exam: Vitals:   11/01/23 0400 11/01/23 0741 11/01/23 0800 11/01/23 1356  BP: 104/62     Pulse: 62 67    Resp: (!) 24 (!) 31  17  Temp: 98.1 F (36.7 C)  98.2 F (36.8 C)   TempSrc: Rectal  Oral   SpO2: 96% 95%    Weight:      Height:       General.  Frail gentleman, in no acute distress. Pulmonary.  Lungs clear bilaterally, normal respiratory effort. CV.  Regular rate and rhythm, no JVD, rub or murmur. Abdomen.  Soft, nontender, nondistended, BS positive. CNS.  Alert and oriented .  No focal neurologic deficit. Extremities.  No edema, no cyanosis, pulses intact and symmetrical. Psychiatry.  Judgment and insight appears normal.   Data Reviewed: Prior data reviewed  Family Communication: Discussed with patient  Disposition: Status is: Inpatient Remains inpatient appropriate because: Severity of illness  Planned Discharge Destination: Home with Home Health  Time spent: 50 minutes  This record has been created using Conservation officer, historic buildings. Errors have been sought and corrected,but may not always be located. Such creation errors do not reflect on the standard of care.   Author: Arnetha Courser, MD 11/01/2023 2:39 PM  For on call review [email or web address].

## 2023-11-01 NOTE — Assessment & Plan Note (Signed)
 Blood pressure mildly elevated. -Continue with amlodipine -As needed hydralazine

## 2023-11-01 NOTE — Assessment & Plan Note (Signed)
 Patient was having some worsening cough and mucus production. No wheezing. -Continue with bronchodilators -Continue with hypertonic saline nebulizer twice daily for total of 3 days -Continue with supportive care -Incentive spirometry and flutter valve

## 2023-11-01 NOTE — Progress Notes (Signed)
 PHARMACY - PHYSICIAN COMMUNICATION CRITICAL VALUE ALERT - BLOOD CULTURE IDENTIFICATION (BCID)  [name] is an 89 y.o. male who presented to [facility] on 10/31/2023 with  sepsis/UTI.  Assessment:  3/4 Bcx: E. Coli (no gene resistance)  Name of physician (or Provider) Contacted: Dr. Loney Loh  Current antibiotics: Cefepime 2g IV every 24 hours + Vancomycin 1500mg  IV every 48 hours  Changes to prescribed antibiotics recommended:   -Continue current regimen covered with cefepime for now -Can consider de-escalation to ceftriaxone 2g IV every 24 hours in future  Results for orders placed or performed during the hospital encounter of 10/31/23  Blood Culture ID Panel (Reflexed) (Collected: 10/31/2023  9:02 AM)  Result Value Ref Range   Enterococcus faecalis NOT DETECTED NOT DETECTED   Enterococcus Faecium NOT DETECTED NOT DETECTED   Listeria monocytogenes NOT DETECTED NOT DETECTED   Staphylococcus species NOT DETECTED NOT DETECTED   Staphylococcus aureus (BCID) NOT DETECTED NOT DETECTED   Staphylococcus epidermidis NOT DETECTED NOT DETECTED   Staphylococcus lugdunensis NOT DETECTED NOT DETECTED   Streptococcus species NOT DETECTED NOT DETECTED   Streptococcus agalactiae NOT DETECTED NOT DETECTED   Streptococcus pneumoniae NOT DETECTED NOT DETECTED   Streptococcus pyogenes NOT DETECTED NOT DETECTED   A.calcoaceticus-baumannii NOT DETECTED NOT DETECTED   Bacteroides fragilis NOT DETECTED NOT DETECTED   Enterobacterales DETECTED (A) NOT DETECTED   Enterobacter cloacae complex NOT DETECTED NOT DETECTED   Escherichia coli DETECTED (A) NOT DETECTED   Klebsiella aerogenes NOT DETECTED NOT DETECTED   Klebsiella oxytoca NOT DETECTED NOT DETECTED   Klebsiella pneumoniae NOT DETECTED NOT DETECTED   Proteus species NOT DETECTED NOT DETECTED   Salmonella species NOT DETECTED NOT DETECTED   Serratia marcescens NOT DETECTED NOT DETECTED   Haemophilus influenzae NOT DETECTED NOT DETECTED    Neisseria meningitidis NOT DETECTED NOT DETECTED   Pseudomonas aeruginosa NOT DETECTED NOT DETECTED   Stenotrophomonas maltophilia NOT DETECTED NOT DETECTED   Candida albicans NOT DETECTED NOT DETECTED   Candida auris NOT DETECTED NOT DETECTED   Candida glabrata NOT DETECTED NOT DETECTED   Candida krusei NOT DETECTED NOT DETECTED   Candida parapsilosis NOT DETECTED NOT DETECTED   Candida tropicalis NOT DETECTED NOT DETECTED   Cryptococcus neoformans/gattii NOT DETECTED NOT DETECTED   CTX-M ESBL NOT DETECTED NOT DETECTED   Carbapenem resistance IMP NOT DETECTED NOT DETECTED   Carbapenem resistance KPC NOT DETECTED NOT DETECTED   Carbapenem resistance NDM NOT DETECTED NOT DETECTED   Carbapenem resist OXA 48 LIKE NOT DETECTED NOT DETECTED   Carbapenem resistance VIM NOT DETECTED NOT DETECTED    Arabella Merles, PharmD. Clinical Pharmacist 11/01/2023 12:51 AM

## 2023-11-01 NOTE — Hospital Course (Addendum)
 Taken from H&P.   Curtis Jacobs is a 89 y.o. male with medical history significant of hypertension, hyperlipidemia, CAD, BPH presents with complaints of urinary retention and weakness following a recent cystoscopy. He was referred by his family doctor for evaluation of microscopic hematuria.  Patient has cystoscopy on Monday, 3/10.  On presentation patient was febrile and tachypneic.  Labs with leukocytosis at 18.6, hemoglobin 10.8, platelets 67, sodium 134, creatinine 2.02, alkaline phosphatase 164, AST 62, ALT 96,  total bilirubin 1.4, and lactic acid 1.2.  Influenza, COVID-19, and RSV screening were negative.  Renal CT noted left renal pelvic calculus measuring 7 x 13 mm with associated urothelial thickening, mild fullness in the left renal collecting system without frank hydronephrosis, focal caliectasis in the right kidney lower pole, mild prominence of bilateral ureters and mild periureteric fat stranding, cholelithiasis without acute cholecystitis.  Blood and urine cultures were obtained.    Patient was given IV fluid and started on broad-spectrum antibiotics per sepsis protocol.  Urology was consulted but they do not think that his left UPJ is or bladder stone is contributing to current symptoms.  3/17: Vital stable and afebrile this morning, maximum temperature recorded was 103.5,Doing labs with slight worsening of leukocytosis at 20.5, hemoglobin 9.3, platelets 66.  CO2 19, BUN 43, creatinine 2.13, improving transaminitis, blood cultures growing E. coli-pending susceptibility, urine cultures pending.  Giving some more IV fluid. Antibiotics narrowed down to ceftriaxone.  3/18: Hemodynamically stable, blood cultures with pansensitive E. coli and urine cultures with E. coli and Enterococcus-antibiotics switched with ampicillin-will continue 1 more days of IV antibiotic and likely be discharged on p.o. ampicillin tomorrow for total of 2 weeks course.

## 2023-11-01 NOTE — Assessment & Plan Note (Signed)
 Improving.  T. bili has been normalized -Continue to monitor

## 2023-11-01 NOTE — Progress Notes (Incomplete)
 PHARMACY - PHYSICIAN COMMUNICATION CRITICAL VALUE ALERT - BLOOD CULTURE IDENTIFICATION (BCID)  Curtis Jacobs is an 89 y.o. male who presented to [facility] on 10/31/2023 with  sepsis/UTI.  Assessment:  3/4 Bcx: E. Coli (no gene resistance)  Name of physician (or Provider) Contacted: Dr. Loney Loh  Current antibiotics: Cefepime 2g IV every 24 hours + Vancomycin 1500mg  IV every 48 hours  Changes to prescribed antibiotics recommended:   -De-escalate to ceftriaxone 2g IV every 24 hours   No results found for this or any previous visit.  Fayrene Fearing [facility] 11/01/2023  12:03 AM

## 2023-11-01 NOTE — Evaluation (Addendum)
 Physical Therapy Evaluation Patient Details Name: Curtis Jacobs MRN: [id number] DOB: 1933-08-06 Today's Date: 11/01/2023  History of Present Illness  89 y.o. male presents 3/16 with complaints of urinary retention and weakness following a recent cystoscopy. Found to have Sepsis due to urinary tract infection/pyelonephritis  Nephrolithiasis. Acute kidney injury superimposed on chronic kidney disease stage III. Weakness with falls at home. Bronchiectasis. PMHx:  hypertension, hyperlipidemia, CAD, BPH.  Clinical Impression  Pt admitted with above diagnosis. Reports significant improvement in strength since admission. Typically independent without assistive device, assists wife with some simple tasks at home as she recovers from back surgery but does not need to lift her reportedly. Currently requires up to [facility] for transfers and gait. Reliant on RW for support. VSS throughout session. Anticipate continued improvement in function as medical status improves. Pt currently with functional limitations due to the deficits listed below (see PT Problem List). Pt will benefit from acute skilled PT to increase their independence and safety with mobility to allow discharge.           If plan is discharge home, recommend the following: A little help with walking and/or transfers;A little help with bathing/dressing/bathroom;Assistance with cooking/housework;Assist for transportation;Help with stairs or ramp for entrance   Can travel by private vehicle        Equipment Recommendations None recommended by PT  Recommendations for Other Services  OT consult    Functional Status Assessment Patient has had a recent decline in their functional status and demonstrates the ability to make significant improvements in function in a reasonable and predictable amount of time.     Precautions / Restrictions Precautions Precautions: Fall Recall of Precautions/Restrictions: Intact Restrictions Weight Bearing  Restrictions Per Provider Order: No      Mobility  Bed Mobility Overal bed mobility: Modified Independent             General bed mobility comments: extra time, increased effort    Transfers Overall transfer level: Needs assistance Equipment used: Rolling walker (2 wheels) Transfers: Sit to/from Stand Sit to Stand: Contact guard assist           General transfer comment: CGA for safety, slow and effortful to rise. Cues for technique. Stabilizes with RW for support.    Ambulation/Gait Ambulation/Gait assistance: Contact guard assist Gait Distance (Feet): 85 Feet Assistive device: Rolling walker (2 wheels) Gait Pattern/deviations: Step-through pattern, Decreased stride length, Trunk flexed Gait velocity: dec Gait velocity interpretation: <1.8 ft/sec, indicate of risk for recurrent falls   General Gait Details: Mild instability, able to self correct with RW for support. Educated on appropriate use. Spo2 94% on RA. 2/3 dyspnea. No buckling or overt LOB. CGA for safety, progressed towards supervision at end of distance.  Stairs            Wheelchair Mobility     Tilt Bed    Modified Rankin (Stroke Patients Only)       Balance Overall balance assessment: Needs assistance Sitting-balance support: No upper extremity supported, Feet supported Sitting balance-Leahy Scale: Good     Standing balance support: Single extremity supported Standing balance-Leahy Scale: Poor Standing balance comment: stabilized with RW                             Pertinent Vitals/Pain Pain Assessment Pain Assessment: No/denies pain    Home Living Family/patient expects to be discharged to:: Private residence Living Arrangements: Spouse/significant other Available Help at Discharge: Family Type  of Home: House Home Access: Stairs to enter Entrance Stairs-Rails: Left Entrance Stairs-Number of Steps: 1+1   Home Layout: One level Home Equipment: Agricultural consultant (2  wheels) Additional Comments: States he assists wife at times who had a back surgery 6 mo ago    Prior Function Prior Level of Function : Independent/Modified Independent;Driving;History of Falls (last six months)             Mobility Comments: ind at baseline ADLs Comments: Ind at baseline, assists with wife's needs but does not physically lift her.     Extremity/Trunk Assessment   Upper Extremity Assessment Upper Extremity Assessment: Defer to OT evaluation    Lower Extremity Assessment Lower Extremity Assessment: Generalized weakness       Communication   Communication Communication: No apparent difficulties    Cognition Arousal: Alert Behavior During Therapy: WFL for tasks assessed/performed   PT - Cognitive impairments: No family/caregiver present to determine baseline                       PT - Cognition Comments: Oriented x4, extra time, reports feeling foggy Following commands: Intact       Cueing Cueing Techniques: Verbal cues     General Comments General comments (skin integrity, edema, etc.): SpO2 94-95% on RA throughout session (reports baseline). RR 24, HR 89. BP 119/106 in bed.    Exercises     Assessment/Plan    PT Assessment Patient needs continued PT services  PT Problem List Decreased strength;Decreased activity tolerance;Decreased balance;Decreased mobility;Decreased cognition;Decreased knowledge of use of DME;Cardiopulmonary status limiting activity       PT Treatment Interventions DME instruction;Gait training;Stair training;Functional mobility training;Therapeutic activities;Therapeutic exercise;Balance training;Neuromuscular re-education;Cognitive remediation;Patient/family education    PT Goals (Current goals can be found in the Care Plan section)  Acute Rehab PT Goals Patient Stated Goal: To go home PT Goal Formulation: With patient Time For Goal Achievement: 11/15/23 Potential to Achieve Goals: Good    Frequency Min  2X/week     Co-evaluation               AM-PAC PT "6 Clicks" Mobility  Outcome Measure Help needed turning from your back to your side while in a flat bed without using bedrails?: None Help needed moving from lying on your back to sitting on the side of a flat bed without using bedrails?: A Little Help needed moving to and from a bed to a chair (including a wheelchair)?: A Little Help needed standing up from a chair using your arms (e.g., wheelchair or bedside chair)?: A Little Help needed to walk in hospital room?: A Little Help needed climbing 3-5 steps with a railing? : A Little 6 Click Score: 19    End of Session Equipment Utilized During Treatment: Gait belt Activity Tolerance: Patient tolerated treatment well Patient left: in chair;with call bell/phone within reach;with chair alarm set   PT Visit Diagnosis: Unsteadiness on feet (R26.81);Other abnormalities of gait and mobility (R26.89);History of falling (Z91.81);Muscle weakness (generalized) (M62.81);Other symptoms and signs involving the nervous system (R29.898)    Time: 8295-6213 PT Time Calculation (min) (ACUTE ONLY): 32 min   Charges:   PT Evaluation $PT Eval Low Complexity: 1 Low PT Treatments $Gait Training: 8-22 mins PT General Charges $$ ACUTE PT VISIT: 1 Visit         Kathlyn Sacramento, PT, DPT [facility]  Rehabilitation Services Physical Therapist Office: [phone] Website: [email or web address]   Berton Mount 11/01/2023, 11:59 AM

## 2023-11-01 NOTE — Progress Notes (Signed)
 Subjective: Patient still feels weak, no fevers, feels like he is urinating well. Not ambulating.  WBC and Cr have slightly worsened   Objective: Vital signs in last 24 hours: Temp:  [97.8 F (36.6 C)-103.5 F (39.7 C)] 98.1 F (36.7 C) (03/17 0400) Pulse Rate:  [65-93] 65 (03/17 0000) Resp:  [19-34] 19 (03/17 0000) BP: (102-145)/(53-67) 102/63 (03/17 0000) SpO2:  [92 %-96 %] 96 % (03/17 0000) Weight:  [86.2 kg] 86.2 kg (03/16 0844)  Intake/Output from previous day: 03/16 0701 - 03/17 0700 In: 243 [P.O.:240; I.V.:3] Out: 1705 [Urine:1705] Intake/Output this shift: Total I/O In: 243 [P.O.:240; I.V.:3] Out: 1305 [Urine:1305]  Physical Exam:  General: Alert and oriented CV: RRR Lungs: Clear Abdomen: Soft, ND, ATTP;  GU: no foley   Lab Results: Recent Labs    10/31/23 0858 11/01/23 0415  HGB 10.8* 9.3*  HCT 32.2* 28.0*   BMET Recent Labs    10/31/23 0858 11/01/23 0415  NA 134* 137  K 3.9 4.2  CL 105 110  CO2 19* 19*  GLUCOSE 140* 117*  BUN 39* 43*  CREATININE 2.02* 2.13*  CALCIUM 9.2 8.7*     Studies/Results: DG CHEST PORT 1 VIEW Result Date: 10/31/2023 CLINICAL DATA:  Short of breath. EXAM: PORTABLE CHEST 1 VIEW COMPARISON:  08/08/2019 and prior studies.  CT, 08/08/2021. FINDINGS: Cardiac silhouette is normal in size. No mediastinal or hilar masses. Lungs demonstrate chronic interstitial prominence and areas of right mid and upper lung scarring. No evidence of pneumonia or pulmonary edema. No pleural effusion or pneumothorax. Skeletal structures are grossly intact. IMPRESSION: 1. No acute cardiopulmonary disease. Electronically Signed   By: Amie Portland M.D.   On: 10/31/2023 16:15   CT Renal Stone Study Result Date: 10/31/2023 CLINICAL DATA:  Told by urology that he had a kidney stone. Urosepsis. Recent cystoscopy. EXAM: CT ABDOMEN AND PELVIS WITHOUT CONTRAST TECHNIQUE: Multidetector CT imaging of the abdomen and pelvis was performed following the  standard protocol without IV contrast. RADIATION DOSE REDUCTION: This exam was performed according to the departmental dose-optimization program which includes automated exposure control, adjustment of the mA and/or kV according to patient size and/or use of iterative reconstruction technique. COMPARISON:  CT scan abdomen and pelvis from 10/14/2023. FINDINGS: Lower chest: There are subpleural atelectatic changes in the visualized lung bases. No overt consolidation. No pleural effusion. The heart is normal in size. No pericardial effusion. Hepatobiliary: The liver is normal in size. Non-cirrhotic configuration. No suspicious mass. These is mild diffuse hepatic steatosis. No intrahepatic or extrahepatic bile duct dilation. Small volume calcified gallstones noted without imaging signs of acute cholecystitis. Normal gallbladder wall thickness. No pericholecystic inflammatory changes. Pancreas: Unremarkable. No pancreatic ductal dilatation or surrounding inflammatory changes. Spleen: Within normal limits. No focal lesion. Adrenals/Urinary Tract: Adrenal glands are unremarkable. No suspicious renal mass within the limitations of this unenhanced exam. Redemonstration of 7 x 13 mm left renal pelvic stone noted with significant associated urothelial thickening. There is mild fullness in the left renal collecting system without frank hydronephrosis. There is a stable 3 x 7 mm nonobstructing calculus in the left kidney lower pole calyx. No left ureterolithiasis. However, there is mild prominence of left ureter and mild left periureteric fat stranding, which is new since the prior study and may represent superimposed urinary tract infection/ureteritis. There is a 3 mm nonobstructing calculus in the right kidney lower pole calyx. No right ureterolithiasis. There is mild right periureteric fat stranding as well, favoring superimposed urinary tract infection. Mild right  kidney lower pole caliectasis. However, no frank right  hydroureteronephrosis. Redemonstration of a probable mobile 8 x 10 mm calculus in the posterior portion of urinary bladder, which appears in different position when compared to the recent prior exam from 10/14/2023. Urinary bladder is otherwise unremarkable. No focal mass or perivesical fat stranding there is interval clearing of previously noted smaller bladder calculus. Stomach/Bowel: No disproportionate dilation of the small or large bowel loops. No evidence of abnormal bowel wall thickening or inflammatory changes. The appendix was not visualized; however there is no acute inflammatory process in the right lower quadrant. There are scattered diverticula mainly in the sigmoid colon, without imaging signs of diverticulitis. Vascular/Lymphatic: No ascites or pneumoperitoneum. No abdominal or pelvic lymphadenopathy, by size criteria. No aneurysmal dilation of the major abdominal arteries. There are moderate peripheral atherosclerotic vascular calcifications of the aorta and its major branches. Reproductive: Enlarged prostate. Symmetric seminal vesicles. Other: The visualized soft tissues and abdominal wall are unremarkable. Musculoskeletal: No suspicious osseous lesions. There are mild - moderate multilevel degenerative changes in the visualized spine. IMPRESSION: 1. Redemonstration of left renal pelvic calculus (7 x 13 mm) with associated urothelial thickening. There is mild fullness in the left renal collecting system without frank hydronephrosis. There is focal caliectasis in the right kidney lower pole. Mild prominence of bilateral ureters and mild periureteric fat stranding, which is new since the prior study and may represent superimposed urinary tract infection/ureteritis. Correlate clinically and with urinalysis. 2. There is a 8 x 10 mm calculus in the urinary bladder. Previously seen other smaller calculus has resolved in the interim. 3. Multiple other nonacute observations (such as cholelithiasis without  acute cholecystitis, colonic diverticula, prostatomegaly, etc.), As described above. 4. Aortic atherosclerosis. Aortic Atherosclerosis (ICD10-I70.0). Electronically Signed   By: Jules Schick M.D.   On: 10/31/2023 11:08    Assessment/Plan: 71 M w/ bladder stone, BPH, and non obstructive kidney stone admitted for septic UTI and possible pneumonia after cystoscopy last week.   # UTI: - blood Cx growing enterobacterales and E. Coli sensitivities pending  - continue broad spectrum abx until sensitivities result    # BPH  - confirm emptying bladder with PVR - continue terazosin   # L renal stone - non obstructing  - no stent needed  - will be managed as outpatient  - if patient becomes unstable reach out to urology immediately    LOS: 1 day   Sherle Poe MD 11/01/2023, 7:00 AM [facility]

## 2023-11-01 NOTE — Assessment & Plan Note (Signed)
 Nephrolithiasis. Blood cultures growing E. coli-pending susceptibility.  Urine cultures pending.  History of recent cystoscopy. CT with some left renal pelvic and bladder calculi-urology is on board and does not think that current illness is related to that as there is no obstruction.  Also concern of pyelonephritis with mild periureteric and renal stranding. -Antibiotics switched to ceftriaxone-follow-up final culture results -Continue with supportive care

## 2023-11-01 NOTE — Assessment & Plan Note (Signed)
 Outpatient follow-up with pulmonology

## 2023-11-01 NOTE — Progress Notes (Signed)
 Ok to optimize vanc/cefepime to ceftriaxone for e.coli bacteremia pending sensitivities per Dr. Nelson Chimes.  Ulyses Southward, PharmD, BCIDP, AAHIVP, CPP Infectious Disease Pharmacist 11/01/2023 8:14 AM

## 2023-11-01 NOTE — Evaluation (Signed)
 Occupational Therapy Evaluation Patient Details Name: Curtis Jacobs MRN: [id number] DOB: [birth year] Today's Date: 11/01/2023   History of Present Illness   89 y.o. male presents 3/16 with complaints of urinary retention and weakness following a recent cystoscopy. Found to have Sepsis due to urinary tract infection/pyelonephritis  Nephrolithiasis. Acute kidney injury superimposed on chronic kidney disease stage III. Weakness with falls at home. Bronchiectasis. PMHx:  hypertension, hyperlipidemia, CAD, BPH.     Clinical Impressions Pt reports ind at baseline with ADLs/functional mobility, lives with spouse. Pt currently needing up to min A for ADLs, CGA for transfers with RW. Pt with mild unsteadiness, and incr coughing, however SpO2 remaining in 90s throughout session on RA, and satting 95-96% on RA at end of session, O2 left off with RN aware (notified via securechat). Pt presenting with impairments listed below, will follow acutely. Recommend HHOT at d/c.      If plan is discharge home, recommend the following:   A little help with walking and/or transfers;A little help with bathing/dressing/bathroom;Assistance with cooking/housework;Direct supervision/assist for medications management;Direct supervision/assist for financial management;Assist for transportation;Help with stairs or ramp for entrance     Functional Status Assessment   Patient has had a recent decline in their functional status and demonstrates the ability to make significant improvements in function in a reasonable and predictable amount of time.     Equipment Recommendations   Tub/shower seat     Recommendations for Other Services   PT consult     Precautions/Restrictions   Precautions Precautions: Fall Recall of Precautions/Restrictions: Intact Restrictions Weight Bearing Restrictions Per Provider Order: No     Mobility Bed Mobility               General bed mobility comments: in recliner  upon arrival and departure    Transfers Overall transfer level: Needs assistance Equipment used: Rolling walker (2 wheels) Transfers: Sit to/from Stand Sit to Stand: Contact guard assist                  Balance Overall balance assessment: Needs assistance Sitting-balance support: No upper extremity supported, Feet supported Sitting balance-Leahy Scale: Good     Standing balance support: Single extremity supported Standing balance-Leahy Scale: Poor Standing balance comment: stabilized with RW                           ADL either performed or assessed with clinical judgement   ADL Overall ADL's : Needs assistance/impaired Eating/Feeding: Set up;Sitting   Grooming: Oral care;Standing;Contact guard assist   Upper Body Bathing: Contact guard assist;Sitting   Lower Body Bathing: Minimal assistance;Sitting/lateral leans   Upper Body Dressing : Contact guard assist   Lower Body Dressing: Minimal assistance   Toilet Transfer: Contact guard assist;Ambulation;Rolling walker (2 wheels)   Toileting- Clothing Manipulation and Hygiene: Contact guard assist       Functional mobility during ADLs: Contact guard assist;Rolling walker (2 wheels)       Vision   Vision Assessment?: No apparent visual deficits     Perception Perception: Not tested       Praxis Praxis: Not tested       Pertinent Vitals/Pain Pain Assessment Pain Assessment: No/denies pain     Extremity/Trunk Assessment Upper Extremity Assessment Upper Extremity Assessment: Generalized weakness   Lower Extremity Assessment Lower Extremity Assessment: Defer to PT evaluation   Cervical / Trunk Assessment Cervical / Trunk Assessment: Normal   Communication Communication Communication: No apparent difficulties  Cognition Arousal: Alert Behavior During Therapy: WFL for tasks assessed/performed Cognition: No apparent impairments                               Following  commands: Intact       Cueing  General Comments   Cueing Techniques: Verbal cues  SpO2 in 90s thorughout session on RA   Exercises     Shoulder Instructions      Home Living Family/patient expects to be discharged to:: Private residence Living Arrangements: Spouse/significant other Available Help at Discharge: Family Type of Home: House Home Access: Stairs to enter Secretary/administrator of Steps: 1+1 Entrance Stairs-Rails: Left Home Layout: One level     Bathroom Shower/Tub: Chief Strategy Officer: Standard Bathroom Accessibility: No   Home Equipment: Agricultural consultant (2 wheels)   Additional Comments: States he assists wife at times who had a back surgery 6 mo ago      Prior Functioning/Environment Prior Level of Function : Independent/Modified Independent;Driving;History of Falls (last six months)             Mobility Comments: ind at baseline ADLs Comments: Ind at baseline, assists with wife's needs but does not physically lift her.    OT Problem List: Decreased strength;Decreased range of motion;Impaired balance (sitting and/or standing);Decreased activity tolerance;Decreased safety awareness   OT Treatment/Interventions: Self-care/ADL training;Therapeutic exercise;Energy conservation;DME and/or AE instruction;Therapeutic activities;Patient/family education;Balance training      OT Goals(Current goals can be found in the care plan section)   Acute Rehab OT Goals Patient Stated Goal: none stated OT Goal Formulation: With patient Time For Goal Achievement: 11/15/23 Potential to Achieve Goals: Good ADL Goals Pt Will Perform Upper Body Dressing: with modified independence;sitting Pt Will Perform Lower Body Dressing: with modified independence;sitting/lateral leans;sit to/from stand Pt Will Transfer to Toilet: with modified independence;ambulating;regular height toilet Pt Will Perform Tub/Shower Transfer: Tub transfer;Shower transfer;with  modified independence;ambulating   OT Frequency:  Min 1X/week    Co-evaluation              AM-PAC OT "6 Clicks" Daily Activity     Outcome Measure Help from another person eating meals?: A Little Help from another person taking care of personal grooming?: A Little Help from another person toileting, which includes using toliet, bedpan, or urinal?: A Little Help from another person bathing (including washing, rinsing, drying)?: A Little Help from another person to put on and taking off regular upper body clothing?: A Little Help from another person to put on and taking off regular lower body clothing?: A Little 6 Click Score: 18   End of Session Equipment Utilized During Treatment: Gait belt;Rolling walker (2 wheels) Nurse Communication: Mobility status (O2 left off with pt satting 95%)  Activity Tolerance: Patient tolerated treatment well Patient left: in chair;with call bell/phone within reach;with chair alarm set  OT Visit Diagnosis: Unsteadiness on feet (R26.81);Other abnormalities of gait and mobility (R26.89);Muscle weakness (generalized) (M62.81)                Time: 0865-7846 OT Time Calculation (min): 37 min Charges:  OT General Charges $OT Visit: 1 Visit OT Evaluation $OT Eval Moderate Complexity: 1 Mod OT Treatments $Self Care/Home Management : 8-22 mins  Carver Fila, OTD, OTR/L SecureChat Preferred Acute Rehab [phone]   Carver Fila Koonce 11/01/2023, 4:52 PM

## 2023-11-01 NOTE — Assessment & Plan Note (Signed)
 At baseline patient able to ambulate without needed assistance, but since Monday had progressively worsening weakness to the point which he was unable to stand up.  Patient notes that he had fallen this morning while trying to go to the bathroom and was unable to get up.  Denied any significant injury. -PT to evaluate and treat-recommending home health

## 2023-11-01 NOTE — Assessment & Plan Note (Signed)
 Acute on chronic.  Baseline hemoglobin previously had been around 11-12 with platelets 90-110s.  Current hemoglobin of 9.3 and platelet of 66.  Blood was present in patient's urine. -Monitor hemoglobin -Transfuse if below 7

## 2023-11-02 DIAGNOSIS — N200 Calculus of kidney: Secondary | ICD-10-CM | POA: Diagnosis not present

## 2023-11-02 DIAGNOSIS — N179 Acute kidney failure, unspecified: Secondary | ICD-10-CM | POA: Diagnosis not present

## 2023-11-02 DIAGNOSIS — A419 Sepsis, unspecified organism: Secondary | ICD-10-CM | POA: Diagnosis not present

## 2023-11-02 DIAGNOSIS — W19XXXA Unspecified fall, initial encounter: Secondary | ICD-10-CM | POA: Diagnosis not present

## 2023-11-02 LAB — CULTURE, BLOOD (ROUTINE X 2)
Special Requests: ADEQUATE
Special Requests: ADEQUATE

## 2023-11-02 LAB — CBC
HCT: 28.1 % — ABNORMAL LOW (ref 39.0–52.0)
Hemoglobin: 9.4 g/dL — ABNORMAL LOW (ref 13.0–17.0)
MCH: 31.1 pg (ref 26.0–34.0)
MCHC: 33.5 g/dL (ref 30.0–36.0)
MCV: 93 fL (ref 80.0–100.0)
Platelets: 75 10*3/uL — ABNORMAL LOW (ref 150–400)
RBC: 3.02 MIL/uL — ABNORMAL LOW (ref 4.22–5.81)
RDW: 14.6 % (ref 11.5–15.5)
WBC: 13.5 10*3/uL — ABNORMAL HIGH (ref 4.0–10.5)
nRBC: 0 % (ref 0.0–0.2)

## 2023-11-02 LAB — URINE CULTURE: Culture: 60000 — AB

## 2023-11-02 LAB — BASIC METABOLIC PANEL
Anion gap: 12 (ref 5–15)
BUN: 41 mg/dL — ABNORMAL HIGH (ref 8–23)
CO2: 18 mmol/L — ABNORMAL LOW (ref 22–32)
Calcium: 8.5 mg/dL — ABNORMAL LOW (ref 8.9–10.3)
Chloride: 110 mmol/L (ref 98–111)
Creatinine, Ser: 1.72 mg/dL — ABNORMAL HIGH (ref 0.61–1.24)
GFR, Estimated: 38 mL/min — ABNORMAL LOW (ref >60)
Glucose, Bld: 97 mg/dL (ref 70–99)
Potassium: 3.8 mmol/L (ref 3.5–5.1)
Sodium: 140 mmol/L (ref 135–145)

## 2023-11-02 LAB — GLUCOSE, CAPILLARY: Glucose-Capillary: 100 mg/dL — ABNORMAL HIGH (ref 70–99)

## 2023-11-02 MED ORDER — LACTATED RINGERS IV SOLN: INTRAVENOUS | Status: DC

## 2023-11-02 MED ORDER — SODIUM CHLORIDE 0.9 % IV SOLN
2.0000 g | Freq: Three times a day (TID) | INTRAVENOUS | Status: DC
  Administered 2023-11-02 – 2023-11-04 (×7): 2 g via INTRAVENOUS
  Filled 2023-11-02 (×9): qty 2000

## 2023-11-02 MED ORDER — SODIUM BICARBONATE 650 MG PO TABS
650.0000 mg | ORAL_TABLET | Freq: Two times a day (BID) | ORAL | Status: DC
Start: 2023-11-02 — End: 2023-11-05
  Administered 2023-11-02 – 2023-11-05 (×7): 650 mg via ORAL
  Filled 2023-11-02 (×7): qty 1

## 2023-11-02 MED ORDER — GUAIFENESIN-DM 100-10 MG/5ML PO SYRP
5.0000 mL | ORAL_SOLUTION | ORAL | Status: DC | PRN
  Administered 2023-11-02 – 2023-11-03 (×5): 5 mL via ORAL
  Filled 2023-11-02 (×5): qty 5

## 2023-11-02 NOTE — Assessment & Plan Note (Addendum)
 Likely secondary to sepsis, creatinine improving, currently at 1.72 with baseline around 1.3.  Which is consistent with CKD stage III A also had metabolic acidosis likely secondary to CKD -Added some bicarb p.o. -Continue with gentle IV hydration for another day -Monitor renal function -Avoid nephrotoxins

## 2023-11-02 NOTE — Assessment & Plan Note (Signed)
 Nephrolithiasis. Blood cultures growing pansensitive E. coli and urine cultures with E. coli and Enterococcus.  History of recent cystoscopy. CT with some left renal pelvic and bladder calculi-urology is on board and does not think that current illness is related to that as there is no obstruction.  Also concern of pyelonephritis with mild periureteric and renal stranding. -Antibiotics narrowed down to ampicillin IV, we will do IV for another day and likely be home tomorrow on p.o. ampicillin for a total of 2-week course. -Continue with supportive care

## 2023-11-02 NOTE — Plan of Care (Signed)

## 2023-11-02 NOTE — Progress Notes (Addendum)
 Progress Note   Patient: Curtis Jacobs HYQ:[id number] DOB: June 07, 1933 DOA: 10/31/2023     2 DOS: the patient was seen and examined on 11/02/2023   Brief hospital course: Taken from H&P.   BARON PARMELEE is a 89 y.o. male with medical history significant of hypertension, hyperlipidemia, CAD, BPH presents with complaints of urinary retention and weakness following a recent cystoscopy. He was referred by his family doctor for evaluation of microscopic hematuria.  Patient has cystoscopy on Monday, 3/10.  On presentation patient was febrile and tachypneic.  Labs with leukocytosis at 18.6, hemoglobin 10.8, platelets 67, sodium 134, creatinine 2.02, alkaline phosphatase 164, AST 62, ALT 96,  total bilirubin 1.4, and lactic acid 1.2.  Influenza, COVID-19, and RSV screening were negative.  Renal CT noted left renal pelvic calculus measuring 7 x 13 mm with associated urothelial thickening, mild fullness in the left renal collecting system without frank hydronephrosis, focal caliectasis in the right kidney lower pole, mild prominence of bilateral ureters and mild periureteric fat stranding, cholelithiasis without acute cholecystitis.  Blood and urine cultures were obtained.    Patient was given IV fluid and started on broad-spectrum antibiotics per sepsis protocol.  Urology was consulted but they do not think that his left UPJ is or bladder stone is contributing to current symptoms.  3/17: Vital stable and afebrile this morning, maximum temperature recorded was 103.5,Doing labs with slight worsening of leukocytosis at 20.5, hemoglobin 9.3, platelets 66.  CO2 19, BUN 43, creatinine 2.13, improving transaminitis, blood cultures growing E. coli-pending susceptibility, urine cultures pending.  Giving some more IV fluid. Antibiotics narrowed down to ceftriaxone.  3/18: Hemodynamically stable, blood cultures with pansensitive E. coli and urine cultures with E. coli and Enterococcus-antibiotics switched with  ampicillin-will continue 1 more days of IV antibiotic and likely be discharged on p.o. ampicillin tomorrow for total of 2 weeks course.  Assessment and Plan: * Sepsis due to urinary tract infection (HCC) Nephrolithiasis. Blood cultures growing pansensitive E. coli and urine cultures with E. coli and Enterococcus.  History of recent cystoscopy. CT with some left renal pelvic and bladder calculi-urology is on board and does not think that current illness is related to that as there is no obstruction.  Also concern of pyelonephritis with mild periureteric and renal stranding. -Antibiotics narrowed down to ampicillin IV, we will do IV for another day and likely be home tomorrow on p.o. ampicillin for a total of 2-week course. -Continue with supportive care  Acute kidney injury superimposed on chronic kidney disease (HCC) Likely secondary to sepsis, creatinine improving, currently at 1.72 with baseline around 1.3.  Which is consistent with CKD stage III A also had metabolic acidosis likely secondary to CKD -Added some bicarb p.o. -Continue with gentle IV hydration for another day -Monitor renal function -Avoid nephrotoxins  Fall at home, initial encounter At baseline patient able to ambulate without needed assistance, but since Monday had progressively worsening weakness to the point which he was unable to stand up.  Patient notes that he had fallen this morning while trying to go to the bathroom and was unable to get up.  Denied any significant injury. -PT to evaluate and treat-recommending home health  Bronchiectasis without acute exacerbation (HCC) Patient was having some worsening cough and mucus production. No wheezing. -Continue with bronchodilators -Continue with hypertonic saline nebulizer twice daily for total of 3 days -Continue with supportive care -Incentive spirometry and flutter valve  Multiple pulmonary nodules -Outpatient follow-up with pulmonology  Elevated liver function  tests Improving.  TLudwig Clarks has been normalized -Continue to monitor  Pancytopenia (HCC) Acute on chronic.  Baseline hemoglobin previously had been around 11-12 with platelets 90-110s.  Current hemoglobin of 9.3 and platelet of 66.  Blood was present in patient's urine. -Monitor hemoglobin -Transfuse if below 7  Essential hypertension Blood pressure mildly elevated. -Continue with amlodipine -As needed hydralazine  Dyslipidemia -Holding Crestor due to mild transaminitis  BPH (benign prostatic hyperplasia) -Continue home terazosin, finasteride   Subjective: Patient was seen and examined today.  Still having some cough.  No other concerns.  Physical Exam: Vitals:   11/02/23 0339 11/02/23 0734 11/02/23 1035 11/02/23 1200  BP: 123/62  (!) 143/71 138/70  Pulse: 76 73 81 84  Resp: (!) 23  20 20   Temp: 98 F (36.7 C) 98.4 F (36.9 C)  98.7 F (37.1 C)  TempSrc: Oral Oral  Oral  SpO2: 91% 97% 93% 93%  Weight:      Height:       General.  Frail elderly man, in no acute distress. Pulmonary.  Lungs clear bilaterally, normal respiratory effort. CV.  Regular rate and rhythm, no JVD, rub or murmur. Abdomen.  Soft, nontender, nondistended, BS positive. CNS.  Alert and oriented .  No focal neurologic deficit. Extremities.  No edema, no cyanosis, pulses intact and symmetrical. Psychiatry.  Judgment and insight appears normal.   Data Reviewed: Prior data reviewed  Family Communication: Called wife with no response.  Disposition: Status is: Inpatient Remains inpatient appropriate because: Severity of illness  Planned Discharge Destination: Home with Home Health  Time spent: 45 minutes  This record has been created using Conservation officer, historic buildings. Errors have been sought and corrected,but may not always be located. Such creation errors do not reflect on the standard of care.   Author: Arnetha Courser, MD 11/02/2023 2:48 PM  For on call review [email or web address].

## 2023-11-02 NOTE — Progress Notes (Signed)
 Physical Therapy Treatment Patient Details Name: Curtis Jacobs MRN: [id number] DOB: 02-09-1933 Today's Date: 11/02/2023   History of Present Illness 89 y.o. male presents 3/16 with complaints of urinary retention and weakness following a recent cystoscopy. Found to have Sepsis due to urinary tract infection/pyelonephritis  Nephrolithiasis. Acute kidney injury superimposed on chronic kidney disease stage III. Weakness with falls at home. Bronchiectasis. PMHx:  hypertension, hyperlipidemia, CAD, BPH.    PT Comments  Pt seen for PT tx with pt agreeable with encouragement, wife & son present for session. Wife voices concern re: pt returning home with PT educating her on pt's ability to complete mobility with supervision at this time. Pt is able to ambulate increased distances with RW & supervision. Pt reports he is primarily limited by low back, buttock pain.  Pt does appear to have increased breathing at times but SpO2 >/= 90% on room air when good pleth waveform. Will continue to follow pt acutely to progress mobility as able.   If plan is discharge home, recommend the following: A little help with walking and/or transfers;A little help with bathing/dressing/bathroom;Assistance with cooking/housework;Assist for transportation;Help with stairs or ramp for entrance   Can travel by private vehicle        Equipment Recommendations  None recommended by PT    Recommendations for Other Services       Precautions / Restrictions Precautions Precautions: Fall Recall of Precautions/Restrictions: Intact Restrictions Weight Bearing Restrictions Per Provider Order: No     Mobility  Bed Mobility Overal bed mobility: Modified Independent, Needs Assistance Bed Mobility: Supine to Sit     Supine to sit: HOB elevated, Supervision, Used rails     General bed mobility comments: extra time, reports he's limited by back pain, exits R side of bed    Transfers Overall transfer level: Needs  assistance Equipment used: Rolling walker (2 wheels) Transfers: Sit to/from Stand Sit to Stand: Supervision           General transfer comment: requires cuing re: safe hand placement during STS, transfers STS from EOB, standard chair without armrests but pushes to stand on sink nearby    Ambulation/Gait Ambulation/Gait assistance: Supervision Gait Distance (Feet): 200 Feet Assistive device: Rolling walker (2 wheels) Gait Pattern/deviations: Trunk flexed, Decreased step length - right, Decreased step length - left, Decreased stride length Gait velocity: decreased     General Gait Details: Education re: need to ambluate within base of AD but pt reports hx of spinal stenosis & that he has trunk flexion at all times. Pt pushes RW slightly out in front of him. 1 standing rest break 2/2 fatigue.   Stairs             Wheelchair Mobility     Tilt Bed    Modified Rankin (Stroke Patients Only)       Balance Overall balance assessment: Needs assistance Sitting-balance support: No upper extremity supported, Feet supported Sitting balance-Leahy Scale: Good     Standing balance support: During functional activity, Bilateral upper extremity supported, Reliant on assistive device for balance Standing balance-Leahy Scale: Fair                              Hotel manager: Impaired Factors Affecting Communication: Hearing impaired  Cognition Arousal: Alert Behavior During Therapy: WFL for tasks assessed/performed   PT - Cognitive impairments: No family/caregiver present to determine baseline  Following commands: Intact      Cueing Cueing Techniques: Verbal cues  Exercises      General Comments General comments (skin integrity, edema, etc.): Pt on room air, SpO2 >90% when good pleth waveform.      Pertinent Vitals/Pain Pain Assessment Pain Assessment: Faces Faces Pain Scale: Hurts even  more Pain Location: chronic back pain, chronic sore on buttocks Pain Descriptors / Indicators: Discomfort, Grimacing Pain Intervention(s): Monitored during session, Limited activity within patient's tolerance, Repositioned (notified nurse of sore on pt's buttock & she placed a sacral patch)    Home Living                          Prior Function            PT Goals (current goals can now be found in the care plan section) Acute Rehab PT Goals Patient Stated Goal: To go home PT Goal Formulation: With patient Time For Goal Achievement: 11/15/23 Potential to Achieve Goals: Good Progress towards PT goals: Progressing toward goals    Frequency    Min 2X/week      PT Plan      Co-evaluation              AM-PAC PT "6 Clicks" Mobility   Outcome Measure  Help needed turning from your back to your side while in a flat bed without using bedrails?: None Help needed moving from lying on your back to sitting on the side of a flat bed without using bedrails?: A Little Help needed moving to and from a bed to a chair (including a wheelchair)?: A Little Help needed standing up from a chair using your arms (e.g., wheelchair or bedside chair)?: A Little Help needed to walk in hospital room?: A Little Help needed climbing 3-5 steps with a railing? : A Little 6 Click Score: 19    End of Session   Activity Tolerance: Patient tolerated treatment well;Patient limited by pain Patient left: in chair;with call bell/phone within reach;with family/visitor present Nurse Communication: Mobility status PT Visit Diagnosis: Unsteadiness on feet (R26.81);Other abnormalities of gait and mobility (R26.89);Muscle weakness (generalized) (M62.81);History of falling (Z91.81)     Time: 5784-6962 PT Time Calculation (min) (ACUTE ONLY): 31 min  Charges:    $Therapeutic Activity: 23-37 mins PT General Charges $$ ACUTE PT VISIT: 1 Visit                     Aleda Grana, PT,  DPT 11/02/23, 2:16 PM    Sandi Mariscal 11/02/2023, 2:15 PM

## 2023-11-02 NOTE — Assessment & Plan Note (Signed)
 At baseline patient able to ambulate without needed assistance, but since Monday had progressively worsening weakness to the point which he was unable to stand up.  Patient notes that he had fallen this morning while trying to go to the bathroom and was unable to get up.  Denied any significant injury. -PT to evaluate and treat-recommending home health

## 2023-11-02 NOTE — Progress Notes (Signed)
   11/02/23 1559  TOC Brief Assessment  Insurance and Status Reviewed [facility] Medicare)  Patient has primary care physician Yes  Home environment has been reviewed From home with Spouse  Prior level of function: independent  Prior/Current Home Services No current home services  Social Drivers of Health Review SDOH reviewed no interventions necessary  Readmission risk has been reviewed Yes (18%)  Transition of care needs no transition of care needs at this time   Patient is from home with Wife. Will follow for recommendations

## 2023-11-02 NOTE — Progress Notes (Signed)
   11/02/23 2341 11/02/23 2342 11/02/23 2354  Output (mL)  Urine via external male catheter  (PremoFit)  --  650 mL  --   Urine Characteristics  Urinary Incontinence  --  Yes  --   Urine Color  --  Yellow/straw  --   Urine Appearance  --  Clear  --   Urinary Interventions Bladder scan (pre-void)  --  Bladder scan (post-void)  Bladder Scan Volume (mL) 369 mL  --  247 mL    Filiberto Pinks, RN

## 2023-11-02 NOTE — Progress Notes (Signed)
 Subjective: Doing well sleeping, still is very tired, no N/V/F/C. Patient states that he is having a little trouble urinating.   Objective: Vital signs in last 24 hours: Temp:  [98 F (36.7 C)-98.2 F (36.8 C)] 98 F (36.7 C) (03/18 0339) Pulse Rate:  [67-102] 76 (03/18 0339) Resp:  [17-31] 23 (03/18 0339) BP: (123-151)/(62-77) 123/62 (03/18 0339) SpO2:  [91 %-95 %] 91 % (03/18 0339)  Intake/Output from previous day: 03/17 0701 - 03/18 0700 In: 403.7 [I.V.:303.7; IV Piggyback:100] Out: 900 [Urine:900] Intake/Output this shift: No intake/output data recorded.  Physical Exam:  General: Alert and oriented CV: RRR Lungs: Clear Abdomen: Soft, ND, ATTP;  GU: no foley, no CVA tenderness bilaterally   Lab Results: Recent Labs    10/31/23 0858 11/01/23 0415  HGB 10.8* 9.3*  HCT 32.2* 28.0*   BMET Recent Labs    10/31/23 0858 11/01/23 0415  NA 134* 137  K 3.9 4.2  CL 105 110  CO2 19* 19*  GLUCOSE 140* 117*  BUN 39* 43*  CREATININE 2.02* 2.13*  CALCIUM 9.2 8.7*     Studies/Results: DG CHEST PORT 1 VIEW Result Date: 10/31/2023 CLINICAL DATA:  Short of breath. EXAM: PORTABLE CHEST 1 VIEW COMPARISON:  08/08/2019 and prior studies.  CT, 08/08/2021. FINDINGS: Cardiac silhouette is normal in size. No mediastinal or hilar masses. Lungs demonstrate chronic interstitial prominence and areas of right mid and upper lung scarring. No evidence of pneumonia or pulmonary edema. No pleural effusion or pneumothorax. Skeletal structures are grossly intact. IMPRESSION: 1. No acute cardiopulmonary disease. Electronically Signed   By: Amie Portland M.D.   On: 10/31/2023 16:15   CT Renal Stone Study Result Date: 10/31/2023 CLINICAL DATA:  Told by urology that he had a kidney stone. Urosepsis. Recent cystoscopy. EXAM: CT ABDOMEN AND PELVIS WITHOUT CONTRAST TECHNIQUE: Multidetector CT imaging of the abdomen and pelvis was performed following the standard protocol without IV contrast.  RADIATION DOSE REDUCTION: This exam was performed according to the departmental dose-optimization program which includes automated exposure control, adjustment of the mA and/or kV according to patient size and/or use of iterative reconstruction technique. COMPARISON:  CT scan abdomen and pelvis from 10/14/2023. FINDINGS: Lower chest: There are subpleural atelectatic changes in the visualized lung bases. No overt consolidation. No pleural effusion. The heart is normal in size. No pericardial effusion. Hepatobiliary: The liver is normal in size. Non-cirrhotic configuration. No suspicious mass. These is mild diffuse hepatic steatosis. No intrahepatic or extrahepatic bile duct dilation. Small volume calcified gallstones noted without imaging signs of acute cholecystitis. Normal gallbladder wall thickness. No pericholecystic inflammatory changes. Pancreas: Unremarkable. No pancreatic ductal dilatation or surrounding inflammatory changes. Spleen: Within normal limits. No focal lesion. Adrenals/Urinary Tract: Adrenal glands are unremarkable. No suspicious renal mass within the limitations of this unenhanced exam. Redemonstration of 7 x 13 mm left renal pelvic stone noted with significant associated urothelial thickening. There is mild fullness in the left renal collecting system without frank hydronephrosis. There is a stable 3 x 7 mm nonobstructing calculus in the left kidney lower pole calyx. No left ureterolithiasis. However, there is mild prominence of left ureter and mild left periureteric fat stranding, which is new since the prior study and may represent superimposed urinary tract infection/ureteritis. There is a 3 mm nonobstructing calculus in the right kidney lower pole calyx. No right ureterolithiasis. There is mild right periureteric fat stranding as well, favoring superimposed urinary tract infection. Mild right kidney lower pole caliectasis. However, no frank right hydroureteronephrosis. Redemonstration  of a  probable mobile 8 x 10 mm calculus in the posterior portion of urinary bladder, which appears in different position when compared to the recent prior exam from 10/14/2023. Urinary bladder is otherwise unremarkable. No focal mass or perivesical fat stranding there is interval clearing of previously noted smaller bladder calculus. Stomach/Bowel: No disproportionate dilation of the small or large bowel loops. No evidence of abnormal bowel wall thickening or inflammatory changes. The appendix was not visualized; however there is no acute inflammatory process in the right lower quadrant. There are scattered diverticula mainly in the sigmoid colon, without imaging signs of diverticulitis. Vascular/Lymphatic: No ascites or pneumoperitoneum. No abdominal or pelvic lymphadenopathy, by size criteria. No aneurysmal dilation of the major abdominal arteries. There are moderate peripheral atherosclerotic vascular calcifications of the aorta and its major branches. Reproductive: Enlarged prostate. Symmetric seminal vesicles. Other: The visualized soft tissues and abdominal wall are unremarkable. Musculoskeletal: No suspicious osseous lesions. There are mild - moderate multilevel degenerative changes in the visualized spine. IMPRESSION: 1. Redemonstration of left renal pelvic calculus (7 x 13 mm) with associated urothelial thickening. There is mild fullness in the left renal collecting system without frank hydronephrosis. There is focal caliectasis in the right kidney lower pole. Mild prominence of bilateral ureters and mild periureteric fat stranding, which is new since the prior study and may represent superimposed urinary tract infection/ureteritis. Correlate clinically and with urinalysis. 2. There is a 8 x 10 mm calculus in the urinary bladder. Previously seen other smaller calculus has resolved in the interim. 3. Multiple other nonacute observations (such as cholelithiasis without acute cholecystitis, colonic diverticula,  prostatomegaly, etc.), As described above. 4. Aortic atherosclerosis. Aortic Atherosclerosis (ICD10-I70.0). Electronically Signed   By: Jules Schick M.D.   On: 10/31/2023 11:08    Assessment/Plan: 67 M w/ bladder stone, BPH, and non obstructive kidney stone admitted for septic UTI and possible pneumonia after cystoscopy last week.    # UTI: - blood Cx E. Coli  sensitivities pending  - Urine Cx Growing enterococcus and and E. COli - continue broad spectrum abx until sensitivities result      # BPH  - having some issues with urination PVR ordered  - continue terazosin    # L renal stone - non obstructing, no CVA tenderness or obstructive symptoms. - no stent needed  - will be managed as outpatient  - if patient becomes unstable reach out to urology immediately      LOS: 2 days   Sherle Poe MD 11/02/2023, 7:08 AM [facility]

## 2023-11-02 NOTE — Progress Notes (Cosign Needed Addendum)
 This encounter was created in error - please disregard.  Called patient and wife answered for.  She informed that patient was in hospital today.    Medical screening examination/treatment/procedure(s) were performed by non-physician practitioner and as supervising physician I was immediately available for consultation/collaboration.  I agree with above. Jacinta Shoe, MD

## 2023-11-02 NOTE — Assessment & Plan Note (Signed)
 Improving.  T. bili has been normalized -Continue to monitor

## 2023-11-02 NOTE — Progress Notes (Signed)
 E.coli pan sensitive. Also has some enterococcus in urine. D/w Dr. Nelson Chimes, we will change ceftriaxone to ampicillin 2g IV q8 before transition to PO amoxicillin.  Ulyses Southward, PharmD, BCIDP, AAHIVP, CPP Infectious Disease Pharmacist 11/02/2023 7:50 AM

## 2023-11-03 DIAGNOSIS — A419 Sepsis, unspecified organism: Secondary | ICD-10-CM | POA: Diagnosis not present

## 2023-11-03 DIAGNOSIS — N390 Urinary tract infection, site not specified: Secondary | ICD-10-CM | POA: Diagnosis not present

## 2023-11-03 LAB — COMPREHENSIVE METABOLIC PANEL
ALT: 95 U/L — ABNORMAL HIGH (ref 0–44)
AST: 84 U/L — ABNORMAL HIGH (ref 15–41)
Albumin: 2 g/dL — ABNORMAL LOW (ref 3.5–5.0)
Alkaline Phosphatase: 254 U/L — ABNORMAL HIGH (ref 38–126)
Anion gap: 9 (ref 5–15)
BUN: 35 mg/dL — ABNORMAL HIGH (ref 8–23)
CO2: 20 mmol/L — ABNORMAL LOW (ref 22–32)
Calcium: 8.5 mg/dL — ABNORMAL LOW (ref 8.9–10.3)
Chloride: 109 mmol/L (ref 98–111)
Creatinine, Ser: 1.9 mg/dL — ABNORMAL HIGH (ref 0.61–1.24)
GFR, Estimated: 34 mL/min — ABNORMAL LOW (ref >60)
Glucose, Bld: 104 mg/dL — ABNORMAL HIGH (ref 70–99)
Potassium: 3.8 mmol/L (ref 3.5–5.1)
Sodium: 138 mmol/L (ref 135–145)
Total Bilirubin: 1.1 mg/dL (ref 0.0–1.2)
Total Protein: 5.1 g/dL — ABNORMAL LOW (ref 6.5–8.1)

## 2023-11-03 LAB — CBC
HCT: 28.1 % — ABNORMAL LOW (ref 39.0–52.0)
Hemoglobin: 9.4 g/dL — ABNORMAL LOW (ref 13.0–17.0)
MCH: 31.2 pg (ref 26.0–34.0)
MCHC: 33.5 g/dL (ref 30.0–36.0)
MCV: 93.4 fL (ref 80.0–100.0)
Platelets: 92 10*3/uL — ABNORMAL LOW (ref 150–400)
RBC: 3.01 MIL/uL — ABNORMAL LOW (ref 4.22–5.81)
RDW: 14.8 % (ref 11.5–15.5)
WBC: 12.3 10*3/uL — ABNORMAL HIGH (ref 4.0–10.5)
nRBC: 0 % (ref 0.0–0.2)

## 2023-11-03 LAB — BRAIN NATRIURETIC PEPTIDE: B Natriuretic Peptide: 287.6 pg/mL — ABNORMAL HIGH (ref 0.0–100.0)

## 2023-11-03 MED ORDER — SODIUM CHLORIDE 0.9 % IV SOLN: INTRAVENOUS | Status: AC

## 2023-11-03 MED ORDER — REVEFENACIN 175 MCG/3ML IN SOLN
175.0000 ug | Freq: Every day | RESPIRATORY_TRACT | Status: DC
  Administered 2023-11-03 – 2023-11-04 (×2): 175 ug via RESPIRATORY_TRACT
  Filled 2023-11-03 (×3): qty 3

## 2023-11-03 MED ORDER — ARFORMOTEROL TARTRATE 15 MCG/2ML IN NEBU
15.0000 ug | INHALATION_SOLUTION | Freq: Two times a day (BID) | RESPIRATORY_TRACT | Status: DC
  Administered 2023-11-03 – 2023-11-04 (×4): 15 ug via RESPIRATORY_TRACT
  Filled 2023-11-03 (×5): qty 2

## 2023-11-03 MED ORDER — SENNOSIDES-DOCUSATE SODIUM 8.6-50 MG PO TABS: 1.0000 | ORAL_TABLET | Freq: Every evening | ORAL | Status: DC | PRN

## 2023-11-03 MED ORDER — METOPROLOL TARTRATE 5 MG/5ML IV SOLN: 5.0000 mg | INTRAVENOUS | Status: DC | PRN

## 2023-11-03 MED ORDER — PANTOPRAZOLE SODIUM 40 MG PO TBEC
40.0000 mg | DELAYED_RELEASE_TABLET | Freq: Every day | ORAL | Status: DC
  Administered 2023-11-03 – 2023-11-05 (×3): 40 mg via ORAL
  Filled 2023-11-03 (×3): qty 1

## 2023-11-03 MED ORDER — HYDRALAZINE HCL 20 MG/ML IJ SOLN: 10.0000 mg | INTRAMUSCULAR | Status: DC | PRN

## 2023-11-03 NOTE — Progress Notes (Addendum)
   11/03/23 1008  Mobility  Activity Ambulated with assistance in hallway  Level of Assistance Standby assist, set-up cues, supervision of patient - no hands on  Assistive Device Front wheel walker  Distance Ambulated (ft) 200 ft  Activity Response Tolerated fair  Mobility Referral Yes  Mobility visit 1 Mobility  Mobility Specialist Start Time (ACUTE ONLY) [id number]  Mobility Specialist Stop Time (ACUTE ONLY) 1008  Mobility Specialist Time Calculation (min) (ACUTE ONLY) 31 min   Mobility Specialist: Progress Note  Pre-Mobility:      HR 89, SpO2 92% RA During Mobility:             SpO2 88-91% RA Post-Mobility:    HR 88, SpO2 93% RA  Pt agreeable to mobility session - received in bed. C/o lower back soreness. Pt desat during ambulation, self recovery wit PLB cues.  Returned to upright chair with all needs met - call bell within reach. Chair alarm on.  Pt adamant about sitting in upright chair and not recliner. Pt stated he could get back himself, Pt given instructions not to stand and to call when he's ready to get back to bed d/t pt being high fall risk. RN notified. MS will check on pt as time permits.    Upon return to pt room, pt sitting upright in chair with arms on top of pillow across table he states "Why is it when I lay back my chest feels heavy, I get fuzzy headed and I have difficulty swallowing and can't breath, it feels like an elephant is on my chest but when I sit up like this I feel clear headed." MS told patient RN would be noticed. Left sitting upright in chair with NT present. MS will follow up for further mobility as time permits.    Barnie Mort, BS Mobility Specialist Please contact via SecureChat or  Rehab office at [phone].

## 2023-11-03 NOTE — Progress Notes (Signed)
 Pt c/o chest pressure when he lies down, states it feels like an elephant on his chest, but is relieved when he sits up. He says it has been going on for 2 days and declines symptoms at this time. Pt sitting up in chair. Stephania Fragmin, MD to bedside. See new orders.

## 2023-11-03 NOTE — Care Management Important Message (Signed)
 Important Message  Patient Details  Name: Curtis Jacobs MRN: [id number] Date of Birth: September 09, 1932   Important Message Given:  Yes - Medicare IM     [name] 11/03/2023, 1:50 PM

## 2023-11-03 NOTE — Plan of Care (Signed)
  Problem: Elimination: Goal: Will not experience complications related to urinary retention Outcome: Progressing   Problem: Pain Managment: Goal: General experience of comfort will improve and/or be controlled Outcome: Progressing   Problem: Safety: Goal: Ability to remain free from injury will improve Outcome: Progressing   Problem: Skin Integrity: Goal: Risk for impaired skin integrity will decrease Outcome: Progressing

## 2023-11-03 NOTE — Progress Notes (Signed)
 PROGRESS NOTE    Curtis Jacobs  WFU:[id number] DOB: [birth year] DOA: 10/31/2023 PCP: Tresa Garter, MD    Brief Narrative:   89 y.o. male with medical history significant of hypertension, hyperlipidemia, CAD, BPH presents with complaints of urinary retention and weakness following a recent cystoscopy.  Admitted for sepsis secondary to pyelonephritis with renal stone.  No evidence of hydronephrosis noted.  Urine cultures growing E. coli, Enterococcus therefore switched to ampicillin.   Assessment & Plan:  Principal Problem:   Sepsis due to urinary tract infection (HCC) Active Problems:   Nephrolithiasis   Acute kidney injury superimposed on chronic kidney disease (HCC)   Fall at home, initial encounter   Generalized weakness   Multiple pulmonary nodules   Bronchiectasis without acute exacerbation (HCC)   Elevated liver function tests   Pancytopenia (HCC)   Essential hypertension   Dyslipidemia   BPH (benign prostatic hyperplasia)   Sepsis due to urinary tract infection (HCC) Nephrolithiasis. Blood cultures growing pansensitive E. coli and urine cultures with E. coli and Enterococcus.  History of recent cystoscopy. CT with some left renal pelvic and bladder calculi-Per urology no hydronephrosis therefore no acute intervention needed. -On IV ampicillin, will transition to p.o. for additional 2 weeks   Acute kidney injury superimposed on chronic kidney disease (HCC) Secondary to urosepsis.  Baseline 1.3, admission 2.0 to.  Still elevated.  Continue IV fluids  Cough and orthopnea - Patient has hard time exactly describing which leading to this because he also keeps mentioning phlegm - Will order bronchodilators, PPI and echocardiogram.  Continue Mucinex twice daily   Fall at home, initial encounter Seen by PT/OT.  Home health F2F done.   Bronchiectasis without acute exacerbation (HCC) Bronchodilators, I-S/flutter valve   Multiple pulmonary nodules -Outpatient follow-up  with pulmonology   Elevated liver function tests Relatively stable.  Needs to follow-up outpatient   Pancytopenia (HCC) Likely from BMS from sepsis and dilution..  Hemoglobin stable around 9.5.  Platelets stable around 92 and recovering   Essential hypertension Norvasc.  IV as needed   Dyslipidemia -Holding Crestor due to mild transaminitis   BPH (benign prostatic hyperplasia) -Continue home terazosin, finasteride     DVT prophylaxis: SCDs Start: 10/31/23 1226    Code Status: Full Code Family Communication:   Status is: Inpatient Remains inpatient appropriate because: Continue hospital stay due to persistent AKI    Subjective: Seen and bedside.  Continues to report of orthopnea especially when he lays down   Examination:  General exam: Appears calm and comfortable  Respiratory system: Clear to auscultation. Respiratory effort normal. Cardiovascular system: S1 & S2 heard, RRR. No JVD, murmurs, rubs, gallops or clicks. No pedal edema. Gastrointestinal system: Abdomen is nondistended, soft and nontender. No organomegaly or masses felt. Normal bowel sounds heard. Central nervous system: Alert and oriented. No focal neurological deficits. Extremities: Symmetric 5 x 5 power. Skin: No rashes, lesions or ulcers Psychiatry: Judgement and insight appear normal. Mood & affect appropriate.                Diet Orders (From admission, onward)     Start     Ordered   10/31/23 1227  Diet Heart Room service appropriate? Yes; Fluid consistency: Thin  Diet effective now       Question Answer Comment  Room service appropriate? Yes   Fluid consistency: Thin      10/31/23 1229            Objective: Vitals:   11/03/23  4132 11/03/23 0900 11/03/23 0933 11/03/23 0940  BP: (!) 159/69  126/89   Pulse:  93 85 84  Resp:  19 (!) 33 (!) 21  Temp: 99.4 F (37.4 C)  98.8 F (37.1 C)   TempSrc: Oral  Oral   SpO2:  92% 91% 92%  Weight:      Height:         Intake/Output Summary (Last 24 hours) at 11/03/2023 1310 Last data filed at 11/03/2023 0845 Gross per 24 hour  Intake 2342.93 ml  Output 2790 ml  Net -447.07 ml   Filed Weights   10/31/23 0844  Weight: 86.2 kg    Scheduled Meds:  amLODipine  2.5 mg Oral Daily   arformoterol  15 mcg Nebulization BID   aspirin EC  81 mg Oral Daily   finasteride  5 mg Oral Daily   guaiFENesin  1,200 mg Oral BID   loratadine  10 mg Oral Daily   pantoprazole  40 mg Oral QAC breakfast   revefenacin  175 mcg Nebulization Daily   rosuvastatin  20 mg Oral Daily   sodium bicarbonate  650 mg Oral BID   sodium chloride flush  3 mL Intravenous Q12H   sodium chloride HYPERTONIC  4 mL Nebulization BID   terazosin  10 mg Oral QHS   Continuous Infusions:  sodium chloride 200 mL/hr at 11/03/23 0835   ampicillin (OMNIPEN) IV 2 g (11/03/23 0845)    Nutritional status     Body mass index is 27.26 kg/m.  Data Reviewed:   CBC: Recent Labs  Lab 10/31/23 0858 11/01/23 0415 11/02/23 0740 11/03/23 0427  WBC 18.6* 20.5* 13.5* 12.3*  NEUTROABS 14.5*  --   --   --   HGB 10.8* 9.3* 9.4* 9.4*  HCT 32.2* 28.0* 28.1* 28.1*  MCV 93.6 93.6 93.0 93.4  PLT 67* 66* 75* 92*   Basic Metabolic Panel: Recent Labs  Lab 10/31/23 0858 11/01/23 0415 11/02/23 0740 11/03/23 0427  NA 134* 137 140 138  K 3.9 4.2 3.8 3.8  CL 105 110 110 109  CO2 19* 19* 18* 20*  GLUCOSE 140* 117* 97 104*  BUN 39* 43* 41* 35*  CREATININE 2.02* 2.13* 1.72* 1.90*  CALCIUM 9.2 8.7* 8.5* 8.5*   GFR: Estimated Creatinine Clearance: 27.7 mL/min (A) (by C-G formula based on SCr of 1.9 mg/dL (H)). Liver Function Tests: Recent Labs  Lab 10/31/23 0858 11/01/23 0415 11/03/23 0427  AST 62* 38 84*  ALT 96* 70* 95*  ALKPHOS 164* 143* 254*  BILITOT 1.4* 1.1 1.1  PROT 5.9* 5.1* 5.1*  ALBUMIN 2.5* 2.1* 2.0*   No results for input(s): "LIPASE", "AMYLASE" in the last 168 hours. No results for input(s): "AMMONIA" in the last 168  hours. Coagulation Profile: No results for input(s): "INR", "PROTIME" in the last 168 hours. Cardiac Enzymes: No results for input(s): "CKTOTAL", "CKMB", "CKMBINDEX", "TROPONINI" in the last 168 hours. BNP (last 3 results) No results for input(s): "PROBNP" in the last 8760 hours. HbA1C: No results for input(s): "HGBA1C" in the last 72 hours. CBG: Recent Labs  Lab 11/02/23 0737  GLUCAP 100*   Lipid Profile: No results for input(s): "CHOL", "HDL", "LDLCALC", "TRIG", "CHOLHDL", "LDLDIRECT" in the last 72 hours. Thyroid Function Tests: No results for input(s): "TSH", "T4TOTAL", "FREET4", "T3FREE", "THYROIDAB" in the last 72 hours. Anemia Panel: No results for input(s): "VITAMINB12", "FOLATE", "FERRITIN", "TIBC", "IRON", "RETICCTPCT" in the last 72 hours. Sepsis Labs: Recent Labs  Lab 10/31/23 0915  LATICACIDVEN 1.2  Recent Results (from the past 240 hours)  Resp panel by RT-PCR (RSV, Flu A&B, Covid) Anterior Nasal Swab     Status: None   Collection Time: 10/31/23  8:57 AM   Specimen: Anterior Nasal Swab  Result Value Ref Range Status   SARS Coronavirus 2 by RT PCR NEGATIVE NEGATIVE Final   Influenza A by PCR NEGATIVE NEGATIVE Final   Influenza B by PCR NEGATIVE NEGATIVE Final    Comment: (NOTE) The Xpert Xpress SARS-CoV-2/FLU/RSV plus assay is intended as an aid in the diagnosis of influenza from Nasopharyngeal swab specimens and should not be used as a sole basis for treatment. Nasal washings and aspirates are unacceptable for Xpert Xpress SARS-CoV-2/FLU/RSV testing.  Fact Sheet for Patients: BloggerCourse.com  Fact Sheet for Healthcare Providers: SeriousBroker.it  This test is not yet approved or cleared by the [place] FDA and has been authorized for detection and/or diagnosis of SARS-CoV-2 by FDA under an Emergency Use Authorization (EUA). This EUA will remain in effect (meaning this test can be used) for the  duration of the COVID-19 declaration under Section 564(b)(1) of the Act, 21 U.S.C. section 360bbb-3(b)(1), unless the authorization is terminated or revoked.     Resp Syncytial Virus by PCR NEGATIVE NEGATIVE Final    Comment: (NOTE) Fact Sheet for Patients: BloggerCourse.com  Fact Sheet for Healthcare Providers: SeriousBroker.it  This test is not yet approved or cleared by the [place] FDA and has been authorized for detection and/or diagnosis of SARS-CoV-2 by FDA under an Emergency Use Authorization (EUA). This EUA will remain in effect (meaning this test can be used) for the duration of the COVID-19 declaration under Section 564(b)(1) of the Act, 21 U.S.C. section 360bbb-3(b)(1), unless the authorization is terminated or revoked.  Performed at [facility], [address], [place], [place] [address]   Blood Culture (routine x 2)     Status: Abnormal   Collection Time: 10/31/23  8:57 AM   Specimen: BLOOD RIGHT FOREARM  Result Value Ref Range Status   Specimen Description BLOOD RIGHT FOREARM  Final   Special Requests   Final    BOTTLES DRAWN AEROBIC AND ANAEROBIC Blood Culture adequate volume   Culture  Setup Time   Final    GRAM NEGATIVE RODS IN BOTH AEROBIC AND ANAEROBIC BOTTLES CRITICAL VALUE NOTED.  VALUE IS CONSISTENT WITH PREVIOUSLY REPORTED AND CALLED VALUE.    Culture (A)  Final    ESCHERICHIA COLI SUSCEPTIBILITIES PERFORMED ON PREVIOUS CULTURE WITHIN THE LAST 5 DAYS. Performed at [facility], [address] [place], [place] [address]    Report Status 11/02/2023 FINAL  Final  Urine Culture     Status: Abnormal   Collection Time: 10/31/23  8:59 AM   Specimen: Urine, Random  Result Value Ref Range Status   Specimen Description URINE, RANDOM  Final   Special Requests   Final    NONE Reflexed from [id number] Performed at [facility], [address], [place], [place] [address]    Culture (A)   Final    60,000 COLONIES/mL ESCHERICHIA COLI 40,000 COLONIES/mL ENTEROCOCCUS FAECALIS    Report Status 11/02/2023 FINAL  Final   Organism ID, Bacteria ESCHERICHIA COLI (A)  Final   Organism ID, Bacteria ENTEROCOCCUS FAECALIS (A)  Final      Susceptibility   Escherichia coli - MIC*    AMPICILLIN <=2 SENSITIVE Sensitive     CEFAZOLIN <=4 SENSITIVE Sensitive     CEFEPIME <=0.12 SENSITIVE Sensitive  CEFTRIAXONE <=0.25 SENSITIVE Sensitive     CIPROFLOXACIN <=0.25 SENSITIVE Sensitive     GENTAMICIN <=1 SENSITIVE Sensitive     IMIPENEM <=0.25 SENSITIVE Sensitive     NITROFURANTOIN <=16 SENSITIVE Sensitive     TRIMETH/SULFA <=20 SENSITIVE Sensitive     AMPICILLIN/SULBACTAM <=2 SENSITIVE Sensitive     PIP/TAZO <=4 SENSITIVE Sensitive ug/mL    * 60,000 COLONIES/mL ESCHERICHIA COLI   Enterococcus faecalis - MIC*    AMPICILLIN <=2 SENSITIVE Sensitive     NITROFURANTOIN <=16 SENSITIVE Sensitive     VANCOMYCIN 2 SENSITIVE Sensitive     * 40,000 COLONIES/mL ENTEROCOCCUS FAECALIS  Blood Culture (routine x 2)     Status: Abnormal   Collection Time: 10/31/23  9:02 AM   Specimen: BLOOD LEFT FOREARM  Result Value Ref Range Status   Specimen Description BLOOD LEFT FOREARM  Final   Special Requests   Final    BOTTLES DRAWN AEROBIC AND ANAEROBIC Blood Culture adequate volume   Culture  Setup Time   Final    GRAM NEGATIVE RODS IN BOTH AEROBIC AND ANAEROBIC BOTTLES CRITICAL RESULT CALLED TO, READ BACK BY AND VERIFIED WITH: J WYLAND,PHARMD@2350  MK Performed at [facility], [address] [place], [place] [address]    Culture ESCHERICHIA COLI (A)  Final   Report Status 11/02/2023 FINAL  Final   Organism ID, Bacteria ESCHERICHIA COLI  Final   Organism ID, Bacteria ESCHERICHIA COLI  Final      Susceptibility   Escherichia coli - KIRBY BAUER*    CEFAZOLIN SENSITIVE Sensitive    Escherichia coli - MIC*    AMPICILLIN <=2 SENSITIVE Sensitive     CEFEPIME <=0.12 SENSITIVE Sensitive      CEFTAZIDIME <=1 SENSITIVE Sensitive     CEFTRIAXONE <=0.25 SENSITIVE Sensitive     CIPROFLOXACIN <=0.25 SENSITIVE Sensitive     GENTAMICIN <=1 SENSITIVE Sensitive     IMIPENEM <=0.25 SENSITIVE Sensitive     TRIMETH/SULFA <=20 SENSITIVE Sensitive     AMPICILLIN/SULBACTAM <=2 SENSITIVE Sensitive     PIP/TAZO <=4 SENSITIVE Sensitive ug/mL    * ESCHERICHIA COLI    ESCHERICHIA COLI  Blood Culture ID Panel (Reflexed)     Status: Abnormal   Collection Time: 10/31/23  9:02 AM  Result Value Ref Range Status   Enterococcus faecalis NOT DETECTED NOT DETECTED Final   Enterococcus Faecium NOT DETECTED NOT DETECTED Final   Listeria monocytogenes NOT DETECTED NOT DETECTED Final   Staphylococcus species NOT DETECTED NOT DETECTED Final   Staphylococcus aureus (BCID) NOT DETECTED NOT DETECTED Final   Staphylococcus epidermidis NOT DETECTED NOT DETECTED Final   Staphylococcus lugdunensis NOT DETECTED NOT DETECTED Final   Streptococcus species NOT DETECTED NOT DETECTED Final   Streptococcus agalactiae NOT DETECTED NOT DETECTED Final   Streptococcus pneumoniae NOT DETECTED NOT DETECTED Final   Streptococcus pyogenes NOT DETECTED NOT DETECTED Final   A.calcoaceticus-baumannii NOT DETECTED NOT DETECTED Final   Bacteroides fragilis NOT DETECTED NOT DETECTED Final   Enterobacterales DETECTED (A) NOT DETECTED Final    Comment: Enterobacterales represent a large order of gram negative bacteria, not a single organism. CRITICAL RESULT CALLED TO, READ BACK BY AND VERIFIED WITH: J WYLAND,PHARMD@2350  11/01/23 MK    Enterobacter cloacae complex NOT DETECTED NOT DETECTED Final   Escherichia coli DETECTED (A) NOT DETECTED Final    Comment: CRITICAL RESULT CALLED TO, READ BACK BY AND VERIFIED WITH: J WYLAND,PHARMD@2350  10/31/23 MK    Klebsiella aerogenes NOT DETECTED NOT DETECTED Final   Klebsiella  oxytoca NOT DETECTED NOT DETECTED Final   Klebsiella pneumoniae NOT DETECTED NOT DETECTED Final   Proteus species  NOT DETECTED NOT DETECTED Final   Salmonella species NOT DETECTED NOT DETECTED Final   Serratia marcescens NOT DETECTED NOT DETECTED Final   Haemophilus influenzae NOT DETECTED NOT DETECTED Final   Neisseria meningitidis NOT DETECTED NOT DETECTED Final   Pseudomonas aeruginosa NOT DETECTED NOT DETECTED Final   Stenotrophomonas maltophilia NOT DETECTED NOT DETECTED Final   Candida albicans NOT DETECTED NOT DETECTED Final   Candida auris NOT DETECTED NOT DETECTED Final   Candida glabrata NOT DETECTED NOT DETECTED Final   Candida krusei NOT DETECTED NOT DETECTED Final   Candida parapsilosis NOT DETECTED NOT DETECTED Final   Candida tropicalis NOT DETECTED NOT DETECTED Final   Cryptococcus neoformans/gattii NOT DETECTED NOT DETECTED Final   CTX-M ESBL NOT DETECTED NOT DETECTED Final   Carbapenem resistance IMP NOT DETECTED NOT DETECTED Final   Carbapenem resistance KPC NOT DETECTED NOT DETECTED Final   Carbapenem resistance NDM NOT DETECTED NOT DETECTED Final   Carbapenem resist OXA 48 LIKE NOT DETECTED NOT DETECTED Final   Carbapenem resistance VIM NOT DETECTED NOT DETECTED Final    Comment: Performed at [facility], [address], [place], [place] [address]         Radiology Studies: No results found.         LOS: 3 days   Time spent= 35 mins    Miguel Rota, MD [facility]  If 7PM-7AM, please contact night-coverage  11/03/2023, 1:10 PM

## 2023-11-04 ENCOUNTER — Other Ambulatory Visit (HOSPITAL_COMMUNITY)

## 2023-11-04 ENCOUNTER — Inpatient Hospital Stay (HOSPITAL_COMMUNITY)

## 2023-11-04 DIAGNOSIS — A419 Sepsis, unspecified organism: Secondary | ICD-10-CM | POA: Diagnosis not present

## 2023-11-04 DIAGNOSIS — I428 Other cardiomyopathies: Secondary | ICD-10-CM

## 2023-11-04 DIAGNOSIS — N390 Urinary tract infection, site not specified: Secondary | ICD-10-CM | POA: Diagnosis not present

## 2023-11-04 LAB — ECHOCARDIOGRAM COMPLETE
AR max vel: 1.51 cm2
AV Area VTI: 1.45 cm2
AV Area mean vel: 1.46 cm2
AV Mean grad: 8 mmHg
AV Peak grad: 14.9 mmHg
Ao pk vel: 1.93 m/s
Area-P 1/2: 3.6 cm2
Height: 70 in
S' Lateral: 3.6 cm
Weight: 3040 [oz_av]

## 2023-11-04 LAB — BASIC METABOLIC PANEL
Anion gap: 8 (ref 5–15)
BUN: 31 mg/dL — ABNORMAL HIGH (ref 8–23)
CO2: 21 mmol/L — ABNORMAL LOW (ref 22–32)
Calcium: 8.5 mg/dL — ABNORMAL LOW (ref 8.9–10.3)
Chloride: 110 mmol/L (ref 98–111)
Creatinine, Ser: 1.67 mg/dL — ABNORMAL HIGH (ref 0.61–1.24)
GFR, Estimated: 39 mL/min — ABNORMAL LOW (ref >60)
Glucose, Bld: 108 mg/dL — ABNORMAL HIGH (ref 70–99)
Potassium: 4.1 mmol/L (ref 3.5–5.1)
Sodium: 139 mmol/L (ref 135–145)

## 2023-11-04 LAB — CBC
HCT: 27.1 % — ABNORMAL LOW (ref 39.0–52.0)
Hemoglobin: 8.9 g/dL — ABNORMAL LOW (ref 13.0–17.0)
MCH: 31.2 pg (ref 26.0–34.0)
MCHC: 32.8 g/dL (ref 30.0–36.0)
MCV: 95.1 fL (ref 80.0–100.0)
Platelets: 103 10*3/uL — ABNORMAL LOW (ref 150–400)
RBC: 2.85 MIL/uL — ABNORMAL LOW (ref 4.22–5.81)
RDW: 14.6 % (ref 11.5–15.5)
WBC: 13.2 10*3/uL — ABNORMAL HIGH (ref 4.0–10.5)
nRBC: 0 % (ref 0.0–0.2)

## 2023-11-04 LAB — MAGNESIUM: Magnesium: 2.1 mg/dL (ref 1.7–2.4)

## 2023-11-04 MED ORDER — FERROUS SULFATE 325 (65 FE) MG PO TABS
324.0000 mg | ORAL_TABLET | Freq: Every day | ORAL | Status: DC
  Administered 2023-11-05: 324 mg via ORAL
  Filled 2023-11-04: qty 1

## 2023-11-04 MED ORDER — AMOXICILLIN 500 MG PO CAPS
1000.0000 mg | ORAL_CAPSULE | Freq: Three times a day (TID) | ORAL | Status: DC
  Administered 2023-11-04 – 2023-11-05 (×3): 1000 mg via ORAL
  Filled 2023-11-04 (×3): qty 2

## 2023-11-04 MED ORDER — SODIUM CHLORIDE 0.9 % IV SOLN: INTRAVENOUS | Status: AC

## 2023-11-04 NOTE — Plan of Care (Signed)
 Plan of care is reviewed. Pt has been progressing.  Problem: Clinical Measurements: Goal: Ability to maintain clinical measurements within normal limits will improve Outcome: Progressing Goal: Will remain free from infection Outcome: Progressing Goal: Diagnostic test results will improve Outcome: Progressing Goal: Respiratory complications will improve Outcome: Progressing Goal: Cardiovascular complication will be avoided Outcome: Progressing   Problem: Education: Goal: Knowledge of General Education information will improve Description: Including pain rating scale, medication(s)/side effects and non-pharmacologic comfort measures Outcome: Progressing   Problem: Activity: Goal: Risk for activity intolerance will decrease Outcome: Progressing   Problem: Nutrition: Goal: Adequate nutrition will be maintained Outcome: Progressing   Problem: Elimination: Goal: Will not experience complications related to bowel motility Outcome: Progressing Goal: Will not experience complications related to urinary retention Outcome: Progressing   Problem: Pain Managment: Goal: General experience of comfort will improve and/or be controlled Outcome: Progressing   Problem: Skin Integrity: Goal: Risk for impaired skin integrity will decrease Outcome: Progressing   Filiberto Pinks, RN

## 2023-11-04 NOTE — Progress Notes (Signed)
 Physical Therapy Treatment Patient Details Name: Curtis Jacobs MRN: [id number] DOB: March 08, 1933 Today's Date: 11/04/2023   History of Present Illness 89 y.o. male presents 3/16 with complaints of urinary retention and weakness following a recent cystoscopy. Found to have Sepsis due to urinary tract infection/pyelonephritis  Nephrolithiasis. Acute kidney injury superimposed on chronic kidney disease stage III. Weakness with falls at home. Bronchiectasis. PMHx:  hypertension, hyperlipidemia, CAD, BPH.    PT Comments  Tolerated treatment well. Able to ambulate >100 feet with a rolling walker at a mod I level, no loss of balance with this device. SpO2 93-95% on room air. Fatigued towards end of distance so deferred stair training but verbally reviewed how to navigate single step into home and pt appears to have adequate strength to complete this at d/c. He is open to HHPT follow-up to further improve strength and function back to PLOF. Patient will continue to benefit from skilled physical therapy services to further improve independence with functional mobility.     If plan is discharge home, recommend the following: A little help with walking and/or transfers;A little help with bathing/dressing/bathroom;Assistance with cooking/housework;Assist for transportation;Help with stairs or ramp for entrance   Can travel by private vehicle        Equipment Recommendations  None recommended by PT    Recommendations for Other Services       Precautions / Restrictions Precautions Precautions: Fall Recall of Precautions/Restrictions: Intact Restrictions Weight Bearing Restrictions Per Provider Order: No     Mobility  Bed Mobility Overal bed mobility: Modified Independent, Needs Assistance Bed Mobility: Supine to Sit     Supine to sit: Modified independent (Device/Increase time)     General bed mobility comments: extra time, no assist    Transfers Overall transfer level: Needs  assistance Equipment used: Rolling walker (2 wheels), None Transfers: Sit to/from Stand, Bed to chair/wheelchair/BSC Sit to Stand: Supervision   Step pivot transfers: Supervision       General transfer comment: [place] for momentum from low bed setting but able to rise without assist. Supervision for safety, RW for support upon standing. Good control with descent. Second trial without device pt perform step pivot transfer from bed, Slower and guarded but again without LOB or need for assist.    Ambulation/Gait Ambulation/Gait assistance: Modified independent (Device/Increase time) Gait Distance (Feet): 185 Feet Assistive device: Rolling walker (2 wheels) Gait Pattern/deviations: Trunk flexed, Decreased step length - right, Decreased step length - left, Decreased stride length Gait velocity: decreased Gait velocity interpretation: <1.8 ft/sec, indicate of risk for recurrent falls   General Gait Details: Trunk mildly flexed, reports hx of spinal stenosis and is most likely etiology of this abnormality. Mod I with no signs of buckling or overt LOB while using RW for support. Agrees to use at home.   Stairs Stairs:  (Strength appears adequate to safely navigate single step. Verablly reviewed with pt and states he understands. Fatigued after ambulatory bout therefore omitted today.)           Wheelchair Mobility     Tilt Bed    Modified Rankin (Stroke Patients Only)       Balance Overall balance assessment: Needs assistance Sitting-balance support: No upper extremity supported, Feet supported Sitting balance-Leahy Scale: Good     Standing balance support: During functional activity, Bilateral upper extremity supported Standing balance-Leahy Scale: Fair Standing balance comment: Stands unsupported but more stable with RW  Communication Communication Communication: Impaired Factors Affecting Communication: Hearing impaired  Cognition  Arousal: Alert Behavior During Therapy: WFL for tasks assessed/performed   PT - Cognitive impairments: No family/caregiver present to determine baseline                       PT - Cognition Comments: Oriented x4, extra time, reports feeling foggy Following commands: Intact      Cueing Cueing Techniques: Verbal cues  Exercises      General Comments General comments (skin integrity, edema, etc.): SpO2 93-95% on RA throughout session inculding ambulating.      Pertinent Vitals/Pain Pain Assessment Pain Assessment: Faces Faces Pain Scale: Hurts little more Pain Location: chronic back pain, chronic sore on buttocks Pain Descriptors / Indicators: Discomfort, Sore Pain Intervention(s): Monitored during session, Repositioned    Home Living                          Prior Function            PT Goals (current goals can now be found in the care plan section) Acute Rehab PT Goals Patient Stated Goal: To go home PT Goal Formulation: With patient Time For Goal Achievement: 11/15/23 Potential to Achieve Goals: Good Progress towards PT goals: Progressing toward goals    Frequency    Min 2X/week      PT Plan      Co-evaluation              AM-PAC PT "6 Clicks" Mobility   Outcome Measure  Help needed turning from your back to your side while in a flat bed without using bedrails?: None Help needed moving from lying on your back to sitting on the side of a flat bed without using bedrails?: None Help needed moving to and from a bed to a chair (including a wheelchair)?: A Little Help needed standing up from a chair using your arms (e.g., wheelchair or bedside chair)?: A Little Help needed to walk in hospital room?: None Help needed climbing 3-5 steps with a railing? : A Little 6 Click Score: 21    End of Session Equipment Utilized During Treatment: Gait belt Activity Tolerance: Patient tolerated treatment well Patient left: in chair;with call  bell/phone within reach;with nursing/sitter in room Nurse Communication: Mobility status PT Visit Diagnosis: Unsteadiness on feet (R26.81);Other abnormalities of gait and mobility (R26.89);Muscle weakness (generalized) (M62.81);History of falling (Z91.81)     Time: 7829-5621 PT Time Calculation (min) (ACUTE ONLY): 26 min  Charges:    $Therapeutic Activity: 23-37 mins PT General Charges $$ ACUTE PT VISIT: 1 Visit                     Kathlyn Sacramento, PT, DPT [facility]  Rehabilitation Services Physical Therapist Office: [phone] Website: [email or web address]    Berton Mount 11/04/2023, 11:09 AM

## 2023-11-04 NOTE — Plan of Care (Signed)

## 2023-11-04 NOTE — Progress Notes (Signed)
 PROGRESS NOTE    KELSEN CELONA  ZOX:[id number] DOB: 1933/04/10 DOA: 10/31/2023 PCP: Tresa Garter, MD    Brief Narrative:   89 y.o. male with medical history significant of hypertension, hyperlipidemia, CAD, BPH presents with complaints of urinary retention and weakness following a recent cystoscopy.  Admitted for sepsis secondary to pyelonephritis with renal stone.  No evidence of hydronephrosis noted.  Urine cultures growing E. coli, Enterococcus therefore switched to ampicillin.   Assessment & Plan:  Principal Problem:   Sepsis due to urinary tract infection (HCC) Active Problems:   Nephrolithiasis   Acute kidney injury superimposed on chronic kidney disease (HCC)   Fall at home, initial encounter   Generalized weakness   Multiple pulmonary nodules   Bronchiectasis without acute exacerbation (HCC)   Elevated liver function tests   Pancytopenia (HCC)   Essential hypertension   Dyslipidemia   BPH (benign prostatic hyperplasia)   Sepsis due to urinary tract infection (HCC) Nephrolithiasis. Blood cultures growing pansensitive E. coli and urine cultures with E. coli and Enterococcus.  History of recent cystoscopy. CT with some left renal pelvic and bladder calculi-Per urology no hydronephrosis therefore no acute intervention needed. -On IV ampicillin, will transition to p.o. for additional 2 weeks   Acute kidney injury superimposed on chronic kidney disease (HCC) Secondary to urosepsis.  Baseline 1.3, admission 2.0 to.  Still elevated.  Continue IV fluids  Cough and orthopnea - Patient has hard time exactly describing which leading to this because he also keeps mentioning phlegm - Will order bronchodilators, PPI and echocardiogram.  Continue Mucinex twice daily   Fall at home, initial encounter Seen by PT/OT.  Home health F2F done.   Bronchiectasis without acute exacerbation (HCC) Bronchodilators, I-S/flutter valve   Multiple pulmonary nodules -Outpatient follow-up  with pulmonology   Elevated liver function tests Relatively stable.  Needs to follow-up outpatient   Pancytopenia (HCC) Likely from BMS from sepsis and dilution..  Hemoglobin stable around 9.5.  Platelets stable around 92 and recovering   Essential hypertension Norvasc.  IV as needed   Dyslipidemia -Holding Crestor due to mild transaminitis   BPH (benign prostatic hyperplasia) -Continue home terazosin, finasteride     DVT prophylaxis: SCDs Start: 10/31/23 1226    Code Status: Full Code Family Communication:   Status is: Inpatient Remains inpatient appropriate because: Continue hospital stay due to persistent AKI.  Hopefully home tomorrow    Subjective: Overall feels weak but breathing is improved   Examination:  General exam: Appears calm and comfortable  Respiratory system: Clear to auscultation. Respiratory effort normal. Cardiovascular system: S1 & S2 heard, RRR. No JVD, murmurs, rubs, gallops or clicks. No pedal edema. Gastrointestinal system: Abdomen is nondistended, soft and nontender. No organomegaly or masses felt. Normal bowel sounds heard. Central nervous system: Alert and oriented. No focal neurological deficits. Extremities: Symmetric 5 x 5 power. Skin: No rashes, lesions or ulcers Psychiatry: Judgement and insight appear normal. Mood & affect appropriate.                Diet Orders (From admission, onward)     Start     Ordered   10/31/23 1227  Diet Heart Room service appropriate? Yes; Fluid consistency: Thin  Diet effective now       Question Answer Comment  Room service appropriate? Yes   Fluid consistency: Thin      10/31/23 1229            Objective: Vitals:   11/03/23 2332 11/04/23 8119  11/04/23 0755 11/04/23 0820  BP: (!) 141/74 (!) 142/77 (!) 143/76   Pulse: 81 73 74   Resp: (!) 24 (!) 23 (!) 27   Temp: 98 F (36.7 C) 98.3 F (36.8 C) 98.7 F (37.1 C)   TempSrc: Oral Oral Oral   SpO2: 95% 91% 92% 93%  Weight:       Height:        Intake/Output Summary (Last 24 hours) at 11/04/2023 1112 Last data filed at 11/04/2023 0855 Gross per 24 hour  Intake 2284.55 ml  Output 1467 ml  Net 817.55 ml   Filed Weights   10/31/23 0844  Weight: 86.2 kg    Scheduled Meds:  amLODipine  2.5 mg Oral Daily   amoxicillin  1,000 mg Oral Q8H   arformoterol  15 mcg Nebulization BID   aspirin EC  81 mg Oral Daily   [START ON 11/05/2023] ferrous sulfate  324 mg Oral Q breakfast   finasteride  5 mg Oral Daily   guaiFENesin  1,200 mg Oral BID   loratadine  10 mg Oral Daily   pantoprazole  40 mg Oral QAC breakfast   revefenacin  175 mcg Nebulization Daily   rosuvastatin  20 mg Oral Daily   sodium bicarbonate  650 mg Oral BID   sodium chloride flush  3 mL Intravenous Q12H   terazosin  10 mg Oral QHS   Continuous Infusions:  sodium chloride      Nutritional status     Body mass index is 27.26 kg/m.  Data Reviewed:   CBC: Recent Labs  Lab 10/31/23 0858 11/01/23 0415 11/02/23 0740 11/03/23 0427 11/04/23 0417  WBC 18.6* 20.5* 13.5* 12.3* 13.2*  NEUTROABS 14.5*  --   --   --   --   HGB 10.8* 9.3* 9.4* 9.4* 8.9*  HCT 32.2* 28.0* 28.1* 28.1* 27.1*  MCV 93.6 93.6 93.0 93.4 95.1  PLT 67* 66* 75* 92* 103*   Basic Metabolic Panel: Recent Labs  Lab 10/31/23 0858 11/01/23 0415 11/02/23 0740 11/03/23 0427 11/04/23 0417  NA 134* 137 140 138 139  K 3.9 4.2 3.8 3.8 4.1  CL 105 110 110 109 110  CO2 19* 19* 18* 20* 21*  GLUCOSE 140* 117* 97 104* 108*  BUN 39* 43* 41* 35* 31*  CREATININE 2.02* 2.13* 1.72* 1.90* 1.67*  CALCIUM 9.2 8.7* 8.5* 8.5* 8.5*  MG  --   --   --   --  2.1   GFR: Estimated Creatinine Clearance: 31.6 mL/min (A) (by C-G formula based on SCr of 1.67 mg/dL (H)). Liver Function Tests: Recent Labs  Lab 10/31/23 0858 11/01/23 0415 11/03/23 0427  AST 62* 38 84*  ALT 96* 70* 95*  ALKPHOS 164* 143* 254*  BILITOT 1.4* 1.1 1.1  PROT 5.9* 5.1* 5.1*  ALBUMIN 2.5* 2.1* 2.0*   No  results for input(s): "LIPASE", "AMYLASE" in the last 168 hours. No results for input(s): "AMMONIA" in the last 168 hours. Coagulation Profile: No results for input(s): "INR", "PROTIME" in the last 168 hours. Cardiac Enzymes: No results for input(s): "CKTOTAL", "CKMB", "CKMBINDEX", "TROPONINI" in the last 168 hours. BNP (last 3 results) No results for input(s): "PROBNP" in the last 8760 hours. HbA1C: No results for input(s): "HGBA1C" in the last 72 hours. CBG: Recent Labs  Lab 11/02/23 0737  GLUCAP 100*   Lipid Profile: No results for input(s): "CHOL", "HDL", "LDLCALC", "TRIG", "CHOLHDL", "LDLDIRECT" in the last 72 hours. Thyroid Function Tests: No results for input(s): "TSH", "T4TOTAL", "  FREET4", "T3FREE", "THYROIDAB" in the last 72 hours. Anemia Panel: No results for input(s): "VITAMINB12", "FOLATE", "FERRITIN", "TIBC", "IRON", "RETICCTPCT" in the last 72 hours. Sepsis Labs: Recent Labs  Lab 10/31/23 0915  LATICACIDVEN 1.2    Recent Results (from the past 240 hours)  Resp panel by RT-PCR (RSV, Flu A&B, Covid) Anterior Nasal Swab     Status: None   Collection Time: 10/31/23  8:57 AM   Specimen: Anterior Nasal Swab  Result Value Ref Range Status   SARS Coronavirus 2 by RT PCR NEGATIVE NEGATIVE Final   Influenza A by PCR NEGATIVE NEGATIVE Final   Influenza B by PCR NEGATIVE NEGATIVE Final    Comment: (NOTE) The Xpert Xpress SARS-CoV-2/FLU/RSV plus assay is intended as an aid in the diagnosis of influenza from Nasopharyngeal swab specimens and should not be used as a sole basis for treatment. Nasal washings and aspirates are unacceptable for Xpert Xpress SARS-CoV-2/FLU/RSV testing.  Fact Sheet for Patients: BloggerCourse.com  Fact Sheet for Healthcare Providers: SeriousBroker.it  This test is not yet approved or cleared by the [place] FDA and has been authorized for detection and/or diagnosis of SARS-CoV-2 by FDA  under an Emergency Use Authorization (EUA). This EUA will remain in effect (meaning this test can be used) for the duration of the COVID-19 declaration under Section 564(b)(1) of the Act, 21 U.S.C. section 360bbb-3(b)(1), unless the authorization is terminated or revoked.     Resp Syncytial Virus by PCR NEGATIVE NEGATIVE Final    Comment: (NOTE) Fact Sheet for Patients: BloggerCourse.com  Fact Sheet for Healthcare Providers: SeriousBroker.it  This test is not yet approved or cleared by the [place] FDA and has been authorized for detection and/or diagnosis of SARS-CoV-2 by FDA under an Emergency Use Authorization (EUA). This EUA will remain in effect (meaning this test can be used) for the duration of the COVID-19 declaration under Section 564(b)(1) of the Act, 21 U.S.C. section 360bbb-3(b)(1), unless the authorization is terminated or revoked.  Performed at [facility], [address], [place], [place] [address]   Blood Culture (routine x 2)     Status: Abnormal   Collection Time: 10/31/23  8:57 AM   Specimen: BLOOD RIGHT FOREARM  Result Value Ref Range Status   Specimen Description BLOOD RIGHT FOREARM  Final   Special Requests   Final    BOTTLES DRAWN AEROBIC AND ANAEROBIC Blood Culture adequate volume   Culture  Setup Time   Final    GRAM NEGATIVE RODS IN BOTH AEROBIC AND ANAEROBIC BOTTLES CRITICAL VALUE NOTED.  VALUE IS CONSISTENT WITH PREVIOUSLY REPORTED AND CALLED VALUE.    Culture (A)  Final    ESCHERICHIA COLI SUSCEPTIBILITIES PERFORMED ON PREVIOUS CULTURE WITHIN THE LAST 5 DAYS. Performed at [facility], [address], [place], [place] [address]    Report Status 11/02/2023 FINAL  Final  Urine Culture     Status: Abnormal   Collection Time: 10/31/23  8:59 AM   Specimen: Urine, Random  Result Value Ref Range Status   Specimen Description URINE, RANDOM  Final   Special Requests   Final    NONE  Reflexed from [id number] Performed at [facility], [address], [place], [place] [address]    Culture (A)  Final    60,000 COLONIES/mL ESCHERICHIA COLI 40,000 COLONIES/mL ENTEROCOCCUS FAECALIS    Report Status 11/02/2023 FINAL  Final   Organism ID, Bacteria ESCHERICHIA COLI (A)  Final   Organism ID, Bacteria ENTEROCOCCUS FAECALIS (  A)  Final      Susceptibility   Escherichia coli - MIC*    AMPICILLIN <=2 SENSITIVE Sensitive     CEFAZOLIN <=4 SENSITIVE Sensitive     CEFEPIME <=0.12 SENSITIVE Sensitive     CEFTRIAXONE <=0.25 SENSITIVE Sensitive     CIPROFLOXACIN <=0.25 SENSITIVE Sensitive     GENTAMICIN <=1 SENSITIVE Sensitive     IMIPENEM <=0.25 SENSITIVE Sensitive     NITROFURANTOIN <=16 SENSITIVE Sensitive     TRIMETH/SULFA <=20 SENSITIVE Sensitive     AMPICILLIN/SULBACTAM <=2 SENSITIVE Sensitive     PIP/TAZO <=4 SENSITIVE Sensitive ug/mL    * 60,000 COLONIES/mL ESCHERICHIA COLI   Enterococcus faecalis - MIC*    AMPICILLIN <=2 SENSITIVE Sensitive     NITROFURANTOIN <=16 SENSITIVE Sensitive     VANCOMYCIN 2 SENSITIVE Sensitive     * 40,000 COLONIES/mL ENTEROCOCCUS FAECALIS  Blood Culture (routine x 2)     Status: Abnormal   Collection Time: 10/31/23  9:02 AM   Specimen: BLOOD LEFT FOREARM  Result Value Ref Range Status   Specimen Description BLOOD LEFT FOREARM  Final   Special Requests   Final    BOTTLES DRAWN AEROBIC AND ANAEROBIC Blood Culture adequate volume   Culture  Setup Time   Final    GRAM NEGATIVE RODS IN BOTH AEROBIC AND ANAEROBIC BOTTLES CRITICAL RESULT CALLED TO, READ BACK BY AND VERIFIED WITH: J WYLAND,PHARMD@2350  MK Performed at [facility], [address], [place], [place] [address]    Culture ESCHERICHIA COLI (A)  Final   Report Status 11/02/2023 FINAL  Final   Organism ID, Bacteria ESCHERICHIA COLI  Final   Organism ID, Bacteria ESCHERICHIA COLI  Final      Susceptibility   Escherichia coli - KIRBY BAUER*    CEFAZOLIN SENSITIVE Sensitive     Escherichia coli - MIC*    AMPICILLIN <=2 SENSITIVE Sensitive     CEFEPIME <=0.12 SENSITIVE Sensitive     CEFTAZIDIME <=1 SENSITIVE Sensitive     CEFTRIAXONE <=0.25 SENSITIVE Sensitive     CIPROFLOXACIN <=0.25 SENSITIVE Sensitive     GENTAMICIN <=1 SENSITIVE Sensitive     IMIPENEM <=0.25 SENSITIVE Sensitive     TRIMETH/SULFA <=20 SENSITIVE Sensitive     AMPICILLIN/SULBACTAM <=2 SENSITIVE Sensitive     PIP/TAZO <=4 SENSITIVE Sensitive ug/mL    * ESCHERICHIA COLI    ESCHERICHIA COLI  Blood Culture ID Panel (Reflexed)     Status: Abnormal   Collection Time: 10/31/23  9:02 AM  Result Value Ref Range Status   Enterococcus faecalis NOT DETECTED NOT DETECTED Final   Enterococcus Faecium NOT DETECTED NOT DETECTED Final   Listeria monocytogenes NOT DETECTED NOT DETECTED Final   Staphylococcus species NOT DETECTED NOT DETECTED Final   Staphylococcus aureus (BCID) NOT DETECTED NOT DETECTED Final   Staphylococcus epidermidis NOT DETECTED NOT DETECTED Final   Staphylococcus lugdunensis NOT DETECTED NOT DETECTED Final   Streptococcus species NOT DETECTED NOT DETECTED Final   Streptococcus agalactiae NOT DETECTED NOT DETECTED Final   Streptococcus pneumoniae NOT DETECTED NOT DETECTED Final   Streptococcus pyogenes NOT DETECTED NOT DETECTED Final   A.calcoaceticus-baumannii NOT DETECTED NOT DETECTED Final   Bacteroides fragilis NOT DETECTED NOT DETECTED Final   Enterobacterales DETECTED (A) NOT DETECTED Final    Comment: Enterobacterales represent a large order of gram negative bacteria, not a single organism. CRITICAL RESULT CALLED TO, READ BACK BY AND VERIFIED WITH: J WYLAND,PHARMD@2350  11/01/23 MK    Enterobacter cloacae complex NOT DETECTED NOT  DETECTED Final   Escherichia coli DETECTED (A) NOT DETECTED Final    Comment: CRITICAL RESULT CALLED TO, READ BACK BY AND VERIFIED WITH: J WYLAND,PHARMD@2350  10/31/23 MK    Klebsiella aerogenes NOT DETECTED NOT DETECTED Final   Klebsiella  oxytoca NOT DETECTED NOT DETECTED Final   Klebsiella pneumoniae NOT DETECTED NOT DETECTED Final   Proteus species NOT DETECTED NOT DETECTED Final   Salmonella species NOT DETECTED NOT DETECTED Final   Serratia marcescens NOT DETECTED NOT DETECTED Final   Haemophilus influenzae NOT DETECTED NOT DETECTED Final   Neisseria meningitidis NOT DETECTED NOT DETECTED Final   Pseudomonas aeruginosa NOT DETECTED NOT DETECTED Final   Stenotrophomonas maltophilia NOT DETECTED NOT DETECTED Final   Candida albicans NOT DETECTED NOT DETECTED Final   Candida auris NOT DETECTED NOT DETECTED Final   Candida glabrata NOT DETECTED NOT DETECTED Final   Candida krusei NOT DETECTED NOT DETECTED Final   Candida parapsilosis NOT DETECTED NOT DETECTED Final   Candida tropicalis NOT DETECTED NOT DETECTED Final   Cryptococcus neoformans/gattii NOT DETECTED NOT DETECTED Final   CTX-M ESBL NOT DETECTED NOT DETECTED Final   Carbapenem resistance IMP NOT DETECTED NOT DETECTED Final   Carbapenem resistance KPC NOT DETECTED NOT DETECTED Final   Carbapenem resistance NDM NOT DETECTED NOT DETECTED Final   Carbapenem resist OXA 48 LIKE NOT DETECTED NOT DETECTED Final   Carbapenem resistance VIM NOT DETECTED NOT DETECTED Final    Comment: Performed at [facility], [address], [place], [place] [address]         Radiology Studies: No results found.         LOS: 4 days   Time spent= 35 mins    Miguel Rota, MD [facility]  If 7PM-7AM, please contact night-coverage  11/04/2023, 11:12 AM

## 2023-11-04 NOTE — Plan of Care (Signed)

## 2023-11-04 NOTE — Progress Notes (Signed)
 Echocardiogram 2D Echocardiogram has been performed.  Lucendia Herrlich 11/04/2023, 4:25 PM

## 2023-11-05 ENCOUNTER — Other Ambulatory Visit (HOSPITAL_COMMUNITY): Payer: Self-pay

## 2023-11-05 DIAGNOSIS — A419 Sepsis, unspecified organism: Secondary | ICD-10-CM | POA: Diagnosis not present

## 2023-11-05 DIAGNOSIS — N390 Urinary tract infection, site not specified: Secondary | ICD-10-CM | POA: Diagnosis not present

## 2023-11-05 LAB — MAGNESIUM: Magnesium: 2 mg/dL (ref 1.7–2.4)

## 2023-11-05 LAB — BASIC METABOLIC PANEL
Anion gap: 7 (ref 5–15)
BUN: 30 mg/dL — ABNORMAL HIGH (ref 8–23)
CO2: 20 mmol/L — ABNORMAL LOW (ref 22–32)
Calcium: 8.3 mg/dL — ABNORMAL LOW (ref 8.9–10.3)
Chloride: 111 mmol/L (ref 98–111)
Creatinine, Ser: 1.6 mg/dL — ABNORMAL HIGH (ref 0.61–1.24)
GFR, Estimated: 41 mL/min — ABNORMAL LOW (ref >60)
Glucose, Bld: 95 mg/dL (ref 70–99)
Potassium: 4.1 mmol/L (ref 3.5–5.1)
Sodium: 138 mmol/L (ref 135–145)

## 2023-11-05 LAB — CBC
HCT: 27.4 % — ABNORMAL LOW (ref 39.0–52.0)
Hemoglobin: 9.3 g/dL — ABNORMAL LOW (ref 13.0–17.0)
MCH: 31.5 pg (ref 26.0–34.0)
MCHC: 33.9 g/dL (ref 30.0–36.0)
MCV: 92.9 fL (ref 80.0–100.0)
Platelets: 121 10*3/uL — ABNORMAL LOW (ref 150–400)
RBC: 2.95 MIL/uL — ABNORMAL LOW (ref 4.22–5.81)
RDW: 14.7 % (ref 11.5–15.5)
WBC: 13.2 10*3/uL — ABNORMAL HIGH (ref 4.0–10.5)
nRBC: 0 % (ref 0.0–0.2)

## 2023-11-05 LAB — PREPARE RBC (CROSSMATCH)

## 2023-11-05 LAB — ABO/RH: ABO/RH(D): O POS

## 2023-11-05 MED ORDER — SODIUM CHLORIDE 0.9% IV SOLUTION: Freq: Once | INTRAVENOUS | Status: DC

## 2023-11-05 MED ORDER — AMOXICILLIN 500 MG PO CAPS
1000.0000 mg | ORAL_CAPSULE | Freq: Three times a day (TID) | ORAL | 0 refills | Status: AC
  Filled 2023-11-05: qty 78, 13d supply, fill #0

## 2023-11-05 MED ORDER — PANTOPRAZOLE SODIUM 40 MG PO TBEC
40.0000 mg | DELAYED_RELEASE_TABLET | Freq: Every day | ORAL | 0 refills | Status: DC
  Filled 2023-11-05: qty 30, 30d supply, fill #0

## 2023-11-05 MED ORDER — ALBUTEROL SULFATE HFA 108 (90 BASE) MCG/ACT IN AERS
2.0000 | INHALATION_SPRAY | Freq: Four times a day (QID) | RESPIRATORY_TRACT | 0 refills | Status: DC | PRN
  Filled 2023-11-05: qty 18, 30d supply, fill #0

## 2023-11-05 MED ORDER — LOPERAMIDE HCL 2 MG PO CAPS
4.0000 mg | ORAL_CAPSULE | ORAL | Status: DC | PRN
  Administered 2023-11-05: 4 mg via ORAL
  Filled 2023-11-05: qty 2

## 2023-11-05 MED ORDER — GUAIFENESIN ER 600 MG PO TB12
600.0000 mg | ORAL_TABLET | Freq: Two times a day (BID) | ORAL | 0 refills | Status: AC
  Filled 2023-11-05: qty 60, 30d supply, fill #0

## 2023-11-05 NOTE — Progress Notes (Signed)
 Pt ambulatory to bathroom with OT, was OOB on arrival. Ambulated with Mod I + RW, needed increased time for toileting and c/o diarrhea. Pt requesting meds, notified RN, pt verbalized understanding of call bell system to help transfer when done toileting.

## 2023-11-05 NOTE — Progress Notes (Signed)
 Pt has blood clots over his urine on 2 episodes early last night but has good urine output throughout the shift of 

## 2023-11-05 NOTE — Progress Notes (Addendum)
 [facility] night cross cover note:   I was notified by RN of the patient's updated hemoglobin level this morning is 6.6.  This is compared to 8.9 yesterday morning as well as 9.4 on the morning of 11/03/2023.  Patient here with hemorrhagic urinary tract infection complicated by acute pyelonephritis.  RN conveys that the patient urinated twice overnight, with evidence of some blood clots.  It is noted that urology was consulted earlier in this hospitalization.  Does not appear to be on any blood thinners at this time.  Most recent vital signs notable for heart rates in the 80's along with most recent blood pressure 147/87.   I subsequently ordered type and screen as well as transfusion of 1 unit PRBC over 3 hours followed by repeat H&H recheck following completion of this transfusion.    Update: RN updated me that this patient's CBC result remains pending this AM and that the conveyance of Hgb level of 6.6 on this patient was in error.   As a result, I discontinued the orders for transfusion of 1 unit PRBC and also discontinued posttransfusion H&H recheck.     Newton Pigg, DO Hospitalist

## 2023-11-05 NOTE — Progress Notes (Signed)
 Physical Therapy Treatment Patient Details Name: Curtis Jacobs MRN: [id number] DOB: 1933-02-16 Today's Date: 11/05/2023   History of Present Illness 89 y.o. male presents 3/16 with complaints of urinary retention and weakness following a recent cystoscopy. Found to have Sepsis due to urinary tract infection/pyelonephritis  Nephrolithiasis. Acute kidney injury superimposed on chronic kidney disease stage III. Weakness with falls at home. Bronchiectasis. PMHx:  hypertension, hyperlipidemia, CAD, BPH.    PT Comments  Tolerated treatment well. Ambulating >200 feet with RW at mod I level, no loss of balance while using RW for light support, trunk slightly flexed, likely from stenosis. Safely completed stair training with set-up similar to described home environment with a rail on the right side. Completed at a mod I level x2 today. Will continue to follow and progress until d/c.HHPT recommended for continued strength and functional training.    If plan is discharge home, recommend the following: A little help with walking and/or transfers;A little help with bathing/dressing/bathroom;Assistance with cooking/housework;Assist for transportation;Help with stairs or ramp for entrance   Can travel by private vehicle        Equipment Recommendations  None recommended by PT    Recommendations for Other Services       Precautions / Restrictions Precautions Precautions: Fall Recall of Precautions/Restrictions: Intact Restrictions Weight Bearing Restrictions Per Provider Order: No     Mobility  Bed Mobility Overal bed mobility: Modified Independent, Needs Assistance             General bed mobility comments: Able to get into bed and reposition without assist.    Transfers Overall transfer level: Needs assistance Equipment used: Rolling walker (2 wheels) Transfers: Sit to/from Stand Sit to Stand: Supervision           General transfer comment: Supervision for safety when rising from  low toilet, rail for support. No physical assist required.    Ambulation/Gait Ambulation/Gait assistance: Modified independent (Device/Increase time) Gait Distance (Feet): 210 Feet Assistive device: Rolling walker (2 wheels) Gait Pattern/deviations: Trunk flexed, Decreased step length - right, Decreased step length - left, Decreased stride length Gait velocity: decreased Gait velocity interpretation: 1.31 - 2.62 ft/sec, indicative of limited community ambulator   General Gait Details: Slightly flexed trunk, reportedly baseline from stenosis. No overt LOB, no dyspnea. Good control with RW for light support in hallway. Mod I   Stairs Stairs: Yes Stairs assistance: Modified independent (Device/Increase time) Stair Management: One rail Right, Step to pattern, With walker Number of Stairs: 1 (x2) General stair comments: Set-up similar to home environment pt reports 1 step, platform, then threshold to enter his home from the front with single rail on Rt side. (7 steps in rear.) Pt able to safely demonstrate step navigation technique without physical assist. Offered to teach other techniques but felt simple forward step up with single rail was best. Performed before and after ambulating in hallway. Completing two rounds of training at mod I level.   Wheelchair Mobility     Tilt Bed    Modified Rankin (Stroke Patients Only)       Balance Overall balance assessment: Needs assistance Sitting-balance support: No upper extremity supported, Feet supported Sitting balance-Leahy Scale: Good     Standing balance support: During functional activity, No upper extremity supported Standing balance-Leahy Scale: Fair Standing balance comment: Stands unsupported at sink to wash hands.  Communication Communication Communication: No apparent difficulties Factors Affecting Communication: Hearing impaired  Cognition Arousal: Alert Behavior During Therapy: WFL  for tasks assessed/performed   PT - Cognitive impairments: No family/caregiver present to determine baseline                       PT - Cognition Comments: Changing statements frequently about current function. Following commands: Intact      Cueing Cueing Techniques: Verbal cues  Exercises      General Comments General comments (skin integrity, edema, etc.): After stating he feels confident with all activites practiced to safely enter and navigate at home he declined further training this morning. He was then asked by RN if he can enter his home without help and stated "No." When asked which part of his abilities did he not feel comfortable with he states he has 7 steps to get into his house from the back. Reminded patient that he told this therapist that he has 1+ platform and another 1 step threshold to enter home and a rail on the right. This was practiced and he declined need for any further training. He then stated he doesn't know how he will feel when he gets home despite navigating step here safely, without any assistance. CM notified of concerns.      Pertinent Vitals/Pain Pain Assessment Pain Assessment: Faces Faces Pain Scale: Hurts a little bit Pain Location: chronic back pain, chronic sore on buttocks Pain Descriptors / Indicators: Discomfort, Sore Pain Intervention(s): Monitored during session, Repositioned    Home Living                          Prior Function            PT Goals (current goals can now be found in the care plan section) Acute Rehab PT Goals Patient Stated Goal: To go home PT Goal Formulation: With patient Time For Goal Achievement: 11/15/23 Potential to Achieve Goals: Good Progress towards PT goals: Progressing toward goals    Frequency    Min 2X/week      PT Plan      Co-evaluation              AM-PAC PT "6 Clicks" Mobility   Outcome Measure  Help needed turning from your back to your side while in a flat  bed without using bedrails?: None Help needed moving from lying on your back to sitting on the side of a flat bed without using bedrails?: None Help needed moving to and from a bed to a chair (including a wheelchair)?: A Little Help needed standing up from a chair using your arms (e.g., wheelchair or bedside chair)?: A Little Help needed to walk in hospital room?: None Help needed climbing 3-5 steps with a railing? : None 6 Click Score: 22    End of Session Equipment Utilized During Treatment: Gait belt Activity Tolerance: Patient tolerated treatment well Patient left: with call bell/phone within reach;with nursing/sitter in room;in bed;with bed alarm set Nurse Communication: Mobility status PT Visit Diagnosis: Unsteadiness on feet (R26.81);Other abnormalities of gait and mobility (R26.89);Muscle weakness (generalized) (M62.81);History of falling (Z91.81)     Time: 1601-0932 PT Time Calculation (min) (ACUTE ONLY): 18 min  Charges:    $Gait Training: 8-22 mins PT General Charges $$ ACUTE PT VISIT: 1 Visit                     Braycen Burandt  Otelia Santee, DPT [facility]  Rehabilitation Services Physical Therapist Office: [phone] Website: [email or web address]    Berton Mount 11/05/2023, 12:02 PM

## 2023-11-05 NOTE — Discharge Summary (Signed)
 Physician Discharge Summary  Curtis Jacobs:[id number] DOB: 1932-08-29 DOA: 10/31/2023  PCP: Tresa Garter, MD  Admit date: 10/31/2023 Discharge date: 11/05/2023  Admitted From: Home Disposition: Home with home health  Recommendations for Outpatient Follow-up:  Follow up with PCP in 1-2 weeks Please obtain BMP/CBC in one week your next doctors visit.  Albuterol inhaler added. 2 weeks of amoxicillin added PPI daily Mucinex added.  Outpatient follow-up for further workup   Discharge Condition: Stable CODE STATUS: Full code Diet recommendation: Heart healthy  Brief/Interim Summary: Brief Narrative:   89 y.o. male with medical history significant of hypertension, hyperlipidemia, CAD, BPH presents with complaints of urinary retention and weakness following a recent cystoscopy.  Admitted for sepsis secondary to pyelonephritis with renal stone.  No evidence of hydronephrosis noted.  Urine cultures growing E. coli, Enterococcus therefore switched to ampicillin.  Patient has now been transitioned to p.o. amoxicillin.  Due to complaints of orthopnea/cough he was started on bronchodilators, PPI and Mucinex.  Echocardiogram showed preserved EF with mild pericardial effusion without evidence of tamponade.  PT/OT recommended home health services therefore arrangements be made.  Urology will eventually follow-up outpatient.  Patient is medically stable for discharge today.   Assessment & Plan:  Principal Problem:   Sepsis due to urinary tract infection (HCC) Active Problems:   Nephrolithiasis   Acute kidney injury superimposed on chronic kidney disease (HCC)   Fall at home, initial encounter   Generalized weakness   Multiple pulmonary nodules   Bronchiectasis without acute exacerbation (HCC)   Elevated liver function tests   Pancytopenia (HCC)   Essential hypertension   Dyslipidemia   BPH (benign prostatic hyperplasia)   Sepsis due to urinary tract infection  (HCC) Nephrolithiasis. Blood cultures growing pansensitive E. coli and urine cultures with E. coli and Enterococcus.  History of recent cystoscopy. CT with some left renal pelvic and bladder calculi-Per urology no hydronephrosis therefore no acute intervention needed. -On IV ampicillin, now on p.o. amoxicillin total 2 weeks   Acute kidney injury superimposed on chronic kidney disease (HCC) Secondary to urosepsis.  Baseline 1.3, admission 2.0 to.  Creatinine now stable at 1.6.  Cough and orthopnea - Improved.  Echocardiogram shows preserved EF mild pericardial effusion without tamponade.  Will discharge him on PPI, Mucinex and albuterol inhaler.  He needs to follow-up outpatient with PCP   Fall at home, initial encounter Seen by PT/OT.  Home health F2F done.   Bronchiectasis without acute exacerbation (HCC) Bronchodilators, I-S/flutter valve   Multiple pulmonary nodules -Outpatient follow-up with pulmonology   Elevated liver function tests Relatively stable.  Needs to follow-up outpatient   Pancytopenia (HCC) Likely from BMS from sepsis and dilution..  Hemoglobin stable around 9.5.  Platelets are now recovered   Essential hypertension Norvasc.  IV as needed   Dyslipidemia With home statin   BPH (benign prostatic hyperplasia) -Continue home terazosin, finasteride   PT/OT-home with home health  DVT prophylaxis: SCDs Start: 10/31/23 1226    Code Status: Full Code Family Communication:   Status is: Inpatient Remains inpatient appropriate because: Discharge  Subjective:  Feeling well no complaints today.  Examination:  General exam: Appears calm and comfortable  Respiratory system: Clear to auscultation. Respiratory effort normal. Cardiovascular system: S1 & S2 heard, RRR. No JVD, murmurs, rubs, gallops or clicks. No pedal edema. Gastrointestinal system: Abdomen is nondistended, soft and nontender. No organomegaly or masses felt. Normal bowel sounds heard. Central  nervous system: Alert and oriented. No focal neurological deficits. Extremities:  Symmetric 5 x 5 power. Skin: No rashes, lesions or ulcers Psychiatry: Judgement and insight appear normal. Mood & affect appropriate.    Discharge Diagnoses:  Principal Problem:   Sepsis due to urinary tract infection (HCC) Active Problems:   Nephrolithiasis   Acute kidney injury superimposed on chronic kidney disease (HCC)   Fall at home, initial encounter   Generalized weakness   Multiple pulmonary nodules   Bronchiectasis without acute exacerbation (HCC)   Elevated liver function tests   Pancytopenia (HCC)   Essential hypertension   Dyslipidemia   BPH (benign prostatic hyperplasia)      Discharge Exam: Vitals:   11/05/23 0324 11/05/23 0825  BP: (!) 147/87 (!) 149/74  Pulse: 81   Resp: 20 20  Temp: 98.3 F (36.8 C) (!) 97.5 F (36.4 C)  SpO2:  100%   Vitals:   11/04/23 1928 11/04/23 2348 11/05/23 0324 11/05/23 0825  BP:   (!) 147/87 (!) 149/74  Pulse: 77  81   Resp: 14  20 20   Temp:   98.3 F (36.8 C) (!) 97.5 F (36.4 C)  TempSrc:  Oral Oral Oral  SpO2: 98%   100%  Weight:      Height:          Discharge Instructions   Allergies as of 11/05/2023       Reactions   Cordran [flurandrenolide] Rash   rash   Lisinopril Cough   REACTION: cough        Medication List     TAKE these medications    albuterol 108 (90 Base) MCG/ACT inhaler Commonly known as: VENTOLIN HFA Inhale 2 puffs into the lungs every 6 (six) hours as needed for wheezing or shortness of breath.   amLODipine 2.5 MG tablet Commonly known as: NORVASC Take 1 tablet (2.5 mg total) by mouth daily.   amoxicillin 500 MG capsule Commonly known as: AMOXIL Take 2 capsules (1,000 mg total) by mouth every 8 (eight) hours for 13 days.   aspirin EC 81 MG tablet Take 1 tablet (81 mg total) by mouth daily. Swallow whole.   cetirizine 10 MG tablet Commonly known as: ZYRTEC Take 1 tablet by mouth daily.    ferrous sulfate 324 MG Tbec Take 324 mg by mouth. Nature Made   finasteride 5 MG tablet Commonly known as: PROSCAR Take 5 mg by mouth daily.   fluocinonide cream 0.05 % Commonly known as: LIDEX Apply 1 application topically daily.   guaiFENesin 600 MG 12 hr tablet Commonly known as: MUCINEX Take 1 tablet (600 mg total) by mouth 2 (two) times daily.   multivitamin tablet Take 1 tablet by mouth daily.   pantoprazole 40 MG tablet Commonly known as: PROTONIX Take 1 tablet (40 mg total) by mouth daily before breakfast. Start taking on: November 06, 2023   rosuvastatin 20 MG tablet Commonly known as: CRESTOR TAKE 1 TABLET(20 MG) BY MOUTH DAILY   terazosin 10 MG capsule Commonly known as: HYTRIN TAKE 1 CAPSULE BY MOUTH EVERY NIGHT AT BEDTIME. ANNUAL APPT DUE IN [facility].        Follow-up Information     Plotnikov, [name], MD Follow up in 1 week(s).   Specialty: Internal Medicine Contact information: [address] [place] [address] [phone]                Allergies  Allergen Reactions   Cordran [Flurandrenolide] Rash    rash   Lisinopril Cough    REACTION: cough  You were cared for by a hospitalist during your hospital stay. If you have any questions about your discharge medications or the care you received while you were in the hospital after you are discharged, you can call the unit and asked to speak with the hospitalist on call if the hospitalist that took care of you is not available. Once you are discharged, your primary care physician will handle any further medical issues. Please note that no refills for any discharge medications will be authorized once you are discharged, as it is imperative that you return to your primary care physician (or establish a relationship with a primary care physician if you do not have one) for your aftercare needs so that they can reassess your need for medications and monitor your lab values.  You were cared  for by a hospitalist during your hospital stay. If you have any questions about your discharge medications or the care you received while you were in the hospital after you are discharged, you can call the unit and asked to speak with the hospitalist on call if the hospitalist that took care of you is not available. Once you are discharged, your primary care physician will handle any further medical issues. Please note that NO REFILLS for any discharge medications will be authorized once you are discharged, as it is imperative that you return to your primary care physician (or establish a relationship with a primary care physician if you do not have one) for your aftercare needs so that they can reassess your need for medications and monitor your lab values.  Please request your Prim.MD to go over all Hospital Tests and Procedure/Radiological results at the follow up, please get all Hospital records sent to your Prim MD by signing hospital release before you go home.  Get CBC, CMP, 2 view Chest X ray checked  by Primary MD during your next visit or SNF MD in 5-7 days ( we routinely change or add medications that can affect your baseline labs and fluid status, therefore we recommend that you get the mentioned basic workup next visit with your PCP, your PCP may decide not to get them or add new tests based on their clinical decision)  On your next visit with your primary care physician please Get Medicines reviewed and adjusted.  If you experience worsening of your admission symptoms, develop shortness of breath, life threatening emergency, suicidal or homicidal thoughts you must seek medical attention immediately by calling 911 or calling your MD immediately  if symptoms less severe.  You Must read complete instructions/literature along with all the possible adverse reactions/side effects for all the Medicines you take and that have been prescribed to you. Take any new Medicines after you have completely  understood and accpet all the possible adverse reactions/side effects.   Do not drive, operate heavy machinery, perform activities at heights, swimming or participation in water activities or provide baby sitting services if your were admitted for syncope or siezures until you have seen by Primary MD or a Neurologist and advised to do so again.  Do not drive when taking Pain medications.   Procedures/Studies: ECHOCARDIOGRAM COMPLETE Result Date: 11/04/2023    ECHOCARDIOGRAM REPORT   Patient Name:   Curtis Jacobs Date of Exam: 11/04/2023 Medical Rec #:  [id number]    Height:       70.0 in Accession #:    [id number]   Weight:       190.0 lb Date of Birth:  1933-04-21  BSA:          2.042 m Patient Age:    89 years     BP:           143/76 mmHg Patient Gender: M            HR:           73 bpm. Exam Location:  Inpatient Procedure: 2D Echo, Cardiac Doppler and Color Doppler (Both Spectral and Color            Flow Doppler were utilized during procedure). Indications:    Cardiomyopathy  History:        Patient has prior history of Echocardiogram examinations, most                 recent 06/06/2021. Arrythmias:RBBB; Risk Factors:Hypertension,                 Dyslipidemia and Former Smoker.  Sonographer:    Lucendia Herrlich RCS Referring Phys: [id number] Doreen Salvage Herman Mell IMPRESSIONS  1. Left ventricular ejection fraction, by estimation, is 55 to 60%. The left ventricle has normal function. The left ventricle has no regional wall motion abnormalities. Left ventricular diastolic parameters were normal.  2. Right ventricular systolic function is normal. The right ventricular size is normal. Mildly increased right ventricular wall thickness. There is mildly elevated pulmonary artery systolic pressure. The estimated right ventricular systolic pressure is 36.7 mmHg.  3. A small pericardial effusion is present. The pericardial effusion is posterior to the left ventricle. There is no evidence of cardiac tamponade.  4. The mitral  valve is normal in structure. Mild mitral valve regurgitation. No evidence of mitral stenosis.  5. The aortic valve is tricuspid. Aortic valve regurgitation is mild. No aortic stenosis is present.  6. Aortic dilatation noted. There is dilatation of the ascending aorta, measuring 44 mm. There is dilatation of the aortic root, measuring 45 mm.  7. The inferior vena cava is dilated in size with >50% respiratory variability, suggesting right atrial pressure of 8 mmHg. FINDINGS  Left Ventricle: Left ventricular ejection fraction, by estimation, is 55 to 60%. The left ventricle has normal function. The left ventricle has no regional wall motion abnormalities. The left ventricular internal cavity size was normal in size. There is  no left ventricular hypertrophy. Left ventricular diastolic parameters were normal. Right Ventricle: The right ventricular size is normal. Mildly increased right ventricular wall thickness. Right ventricular systolic function is normal. There is mildly elevated pulmonary artery systolic pressure. The tricuspid regurgitant velocity is 2.68 m/s, and with an assumed right atrial pressure of 8 mmHg, the estimated right ventricular systolic pressure is 36.7 mmHg. Left Atrium: Left atrial size was normal in size. Right Atrium: Right atrial size was normal in size. Pericardium: A small pericardial effusion is present. The pericardial effusion is posterior to the left ventricle. There is no evidence of cardiac tamponade. Mitral Valve: The mitral valve is normal in structure. Mild mitral valve regurgitation. No evidence of mitral valve stenosis. Tricuspid Valve: The tricuspid valve is normal in structure. Tricuspid valve regurgitation is mild . No evidence of tricuspid stenosis. Aortic Valve: The aortic valve is tricuspid. Aortic valve regurgitation is mild. No aortic stenosis is present. Aortic valve mean gradient measures 8.0 mmHg. Aortic valve peak gradient measures 14.9 mmHg. Aortic valve area, by VTI  measures 1.45 cm. Pulmonic Valve: The pulmonic valve was not well visualized. Pulmonic valve regurgitation is not visualized. No evidence of pulmonic stenosis. Aorta: Aortic dilatation noted.  There is dilatation of the ascending aorta, measuring 44 mm. There is dilatation of the aortic root, measuring 45 mm. Venous: The inferior vena cava is dilated in size with greater than 50% respiratory variability, suggesting right atrial pressure of 8 mmHg. IAS/Shunts: The atrial septum is grossly normal.  LEFT VENTRICLE PLAX 2D LVIDd:         5.40 cm   Diastology LVIDs:         3.60 cm   LV e' medial:    7.83 cm/s LV PW:         1.10 cm   LV E/e' medial:  12.4 LV IVS:        1.00 cm   LV e' lateral:   11.20 cm/s LVOT diam:     2.00 cm   LV E/e' lateral: 8.7 LV SV:         56 LV SV Index:   27 LVOT Area:     3.14 cm  RIGHT VENTRICLE             IVC RV S prime:     17.10 cm/s  IVC diam: 2.70 cm TAPSE (M-mode): 2.6 cm LEFT ATRIUM             Index        RIGHT ATRIUM           Index LA diam:        3.50 cm 1.71 cm/m   RA Area:     17.25 cm LA Vol (A2C):   37.2 ml 18.21 ml/m  RA Volume:   42.30 ml  20.71 ml/m LA Vol (A4C):   44.4 ml 21.74 ml/m LA Biplane Vol: 42.4 ml 20.76 ml/m  AORTIC VALVE AV Area (Vmax):    1.51 cm AV Area (Vmean):   1.46 cm AV Area (VTI):     1.45 cm AV Vmax:           193.00 cm/s AV Vmean:          127.000 cm/s AV VTI:            0.386 m AV Peak Grad:      14.9 mmHg AV Mean Grad:      8.0 mmHg LVOT Vmax:         92.80 cm/s LVOT Vmean:        59.200 cm/s LVOT VTI:          0.178 m LVOT/AV VTI ratio: 0.46  AORTA Ao Root diam: 4.50 cm Ao Asc diam:  4.40 cm MITRAL VALVE                TRICUSPID VALVE MV Area (PHT): 3.60 cm     TV Peak grad:   28.7 mmHg MV Decel Time: 211 msec     TV Vmax:        2.68 m/s MV E velocity: 96.90 cm/s   TR Peak grad:   28.7 mmHg MV A velocity: 120.00 cm/s  TR Vmax:        268.00 cm/s MV E/A ratio:  0.81                             SHUNTS                              Systemic VTI:  0.18 m  Systemic Diam: 2.00 cm Sunit Tolia Electronically signed by Tessa Lerner Signature Date/Time: 11/04/2023/7:16:53 PM    Final    DG CHEST PORT 1 VIEW Result Date: 10/31/2023 CLINICAL DATA:  Short of breath. EXAM: PORTABLE CHEST 1 VIEW COMPARISON:  08/08/2019 and prior studies.  CT, 08/08/2021. FINDINGS: Cardiac silhouette is normal in size. No mediastinal or hilar masses. Lungs demonstrate chronic interstitial prominence and areas of right mid and upper lung scarring. No evidence of pneumonia or pulmonary edema. No pleural effusion or pneumothorax. Skeletal structures are grossly intact. IMPRESSION: 1. No acute cardiopulmonary disease. Electronically Signed   By: Amie Portland M.D.   On: 10/31/2023 16:15   CT Renal Stone Study Result Date: 10/31/2023 CLINICAL DATA:  Told by urology that he had a kidney stone. Urosepsis. Recent cystoscopy. EXAM: CT ABDOMEN AND PELVIS WITHOUT CONTRAST TECHNIQUE: Multidetector CT imaging of the abdomen and pelvis was performed following the standard protocol without IV contrast. RADIATION DOSE REDUCTION: This exam was performed according to the departmental dose-optimization program which includes automated exposure control, adjustment of the mA and/or kV according to patient size and/or use of iterative reconstruction technique. COMPARISON:  CT scan abdomen and pelvis from 10/14/2023. FINDINGS: Lower chest: There are subpleural atelectatic changes in the visualized lung bases. No overt consolidation. No pleural effusion. The heart is normal in size. No pericardial effusion. Hepatobiliary: The liver is normal in size. Non-cirrhotic configuration. No suspicious mass. These is mild diffuse hepatic steatosis. No intrahepatic or extrahepatic bile duct dilation. Small volume calcified gallstones noted without imaging signs of acute cholecystitis. Normal gallbladder wall thickness. No pericholecystic inflammatory changes. Pancreas:  Unremarkable. No pancreatic ductal dilatation or surrounding inflammatory changes. Spleen: Within normal limits. No focal lesion. Adrenals/Urinary Tract: Adrenal glands are unremarkable. No suspicious renal mass within the limitations of this unenhanced exam. Redemonstration of 7 x 13 mm left renal pelvic stone noted with significant associated urothelial thickening. There is mild fullness in the left renal collecting system without frank hydronephrosis. There is a stable 3 x 7 mm nonobstructing calculus in the left kidney lower pole calyx. No left ureterolithiasis. However, there is mild prominence of left ureter and mild left periureteric fat stranding, which is new since the prior study and may represent superimposed urinary tract infection/ureteritis. There is a 3 mm nonobstructing calculus in the right kidney lower pole calyx. No right ureterolithiasis. There is mild right periureteric fat stranding as well, favoring superimposed urinary tract infection. Mild right kidney lower pole caliectasis. However, no frank right hydroureteronephrosis. Redemonstration of a probable mobile 8 x 10 mm calculus in the posterior portion of urinary bladder, which appears in different position when compared to the recent prior exam from 10/14/2023. Urinary bladder is otherwise unremarkable. No focal mass or perivesical fat stranding there is interval clearing of previously noted smaller bladder calculus. Stomach/Bowel: No disproportionate dilation of the small or large bowel loops. No evidence of abnormal bowel wall thickening or inflammatory changes. The appendix was not visualized; however there is no acute inflammatory process in the right lower quadrant. There are scattered diverticula mainly in the sigmoid colon, without imaging signs of diverticulitis. Vascular/Lymphatic: No ascites or pneumoperitoneum. No abdominal or pelvic lymphadenopathy, by size criteria. No aneurysmal dilation of the major abdominal arteries. There  are moderate peripheral atherosclerotic vascular calcifications of the aorta and its major branches. Reproductive: Enlarged prostate. Symmetric seminal vesicles. Other: The visualized soft tissues and abdominal wall are unremarkable. Musculoskeletal: No suspicious osseous lesions. There are mild - moderate multilevel degenerative  changes in the visualized spine. IMPRESSION: 1. Redemonstration of left renal pelvic calculus (7 x 13 mm) with associated urothelial thickening. There is mild fullness in the left renal collecting system without frank hydronephrosis. There is focal caliectasis in the right kidney lower pole. Mild prominence of bilateral ureters and mild periureteric fat stranding, which is new since the prior study and may represent superimposed urinary tract infection/ureteritis. Correlate clinically and with urinalysis. 2. There is a 8 x 10 mm calculus in the urinary bladder. Previously seen other smaller calculus has resolved in the interim. 3. Multiple other nonacute observations (such as cholelithiasis without acute cholecystitis, colonic diverticula, prostatomegaly, etc.), As described above. 4. Aortic atherosclerosis. Aortic Atherosclerosis (ICD10-I70.0). Electronically Signed   By: Jules Schick M.D.   On: 10/31/2023 11:08     The results of significant diagnostics from this hospitalization (including imaging, microbiology, ancillary and laboratory) are listed below for reference.     Microbiology: Recent Results (from the past 240 hours)  Resp panel by RT-PCR (RSV, Flu A&B, Covid) Anterior Nasal Swab     Status: None   Collection Time: 10/31/23  8:57 AM   Specimen: Anterior Nasal Swab  Result Value Ref Range Status   SARS Coronavirus 2 by RT PCR NEGATIVE NEGATIVE Final   Influenza A by PCR NEGATIVE NEGATIVE Final   Influenza B by PCR NEGATIVE NEGATIVE Final    Comment: (NOTE) The Xpert Xpress SARS-CoV-2/FLU/RSV plus assay is intended as an aid in the diagnosis of influenza from  Nasopharyngeal swab specimens and should not be used as a sole basis for treatment. Nasal washings and aspirates are unacceptable for Xpert Xpress SARS-CoV-2/FLU/RSV testing.  Fact Sheet for Patients: BloggerCourse.com  Fact Sheet for Healthcare Providers: SeriousBroker.it  This test is not yet approved or cleared by the [place] FDA and has been authorized for detection and/or diagnosis of SARS-CoV-2 by FDA under an Emergency Use Authorization (EUA). This EUA will remain in effect (meaning this test can be used) for the duration of the COVID-19 declaration under Section 564(b)(1) of the Act, 21 U.S.C. section 360bbb-3(b)(1), unless the authorization is terminated or revoked.     Resp Syncytial Virus by PCR NEGATIVE NEGATIVE Final    Comment: (NOTE) Fact Sheet for Patients: BloggerCourse.com  Fact Sheet for Healthcare Providers: SeriousBroker.it  This test is not yet approved or cleared by the [place] FDA and has been authorized for detection and/or diagnosis of SARS-CoV-2 by FDA under an Emergency Use Authorization (EUA). This EUA will remain in effect (meaning this test can be used) for the duration of the COVID-19 declaration under Section 564(b)(1) of the Act, 21 U.S.C. section 360bbb-3(b)(1), unless the authorization is terminated or revoked.  Performed at [facility], [address] [place], [place] [address]   Blood Culture (routine x 2)     Status: Abnormal   Collection Time: 10/31/23  8:57 AM   Specimen: BLOOD RIGHT FOREARM  Result Value Ref Range Status   Specimen Description BLOOD RIGHT FOREARM  Final   Special Requests   Final    BOTTLES DRAWN AEROBIC AND ANAEROBIC Blood Culture adequate volume   Culture  Setup Time   Final    GRAM NEGATIVE RODS IN BOTH AEROBIC AND ANAEROBIC BOTTLES CRITICAL VALUE NOTED.  VALUE IS CONSISTENT WITH PREVIOUSLY  REPORTED AND CALLED VALUE.    Culture (A)  Final    ESCHERICHIA COLI SUSCEPTIBILITIES PERFORMED ON PREVIOUS CULTURE WITHIN THE LAST 5 DAYS. Performed at [facility],  [address], [place], [place] [address]    Report Status 11/02/2023 FINAL  Final  Urine Culture     Status: Abnormal   Collection Time: 10/31/23  8:59 AM   Specimen: Urine, Random  Result Value Ref Range Status   Specimen Description URINE, RANDOM  Final   Special Requests   Final    NONE Reflexed from [id number] Performed at [facility], [address], [place], [place] [address]    Culture (A)  Final    60,000 COLONIES/mL ESCHERICHIA COLI 40,000 COLONIES/mL ENTEROCOCCUS FAECALIS    Report Status 11/02/2023 FINAL  Final   Organism ID, Bacteria ESCHERICHIA COLI (A)  Final   Organism ID, Bacteria ENTEROCOCCUS FAECALIS (A)  Final      Susceptibility   Escherichia coli - MIC*    AMPICILLIN <=2 SENSITIVE Sensitive     CEFAZOLIN <=4 SENSITIVE Sensitive     CEFEPIME <=0.12 SENSITIVE Sensitive     CEFTRIAXONE <=0.25 SENSITIVE Sensitive     CIPROFLOXACIN <=0.25 SENSITIVE Sensitive     GENTAMICIN <=1 SENSITIVE Sensitive     IMIPENEM <=0.25 SENSITIVE Sensitive     NITROFURANTOIN <=16 SENSITIVE Sensitive     TRIMETH/SULFA <=20 SENSITIVE Sensitive     AMPICILLIN/SULBACTAM <=2 SENSITIVE Sensitive     PIP/TAZO <=4 SENSITIVE Sensitive ug/mL    * 60,000 COLONIES/mL ESCHERICHIA COLI   Enterococcus faecalis - MIC*    AMPICILLIN <=2 SENSITIVE Sensitive     NITROFURANTOIN <=16 SENSITIVE Sensitive     VANCOMYCIN 2 SENSITIVE Sensitive     * 40,000 COLONIES/mL ENTEROCOCCUS FAECALIS  Blood Culture (routine x 2)     Status: Abnormal   Collection Time: 10/31/23  9:02 AM   Specimen: BLOOD LEFT FOREARM  Result Value Ref Range Status   Specimen Description BLOOD LEFT FOREARM  Final   Special Requests   Final    BOTTLES DRAWN AEROBIC AND ANAEROBIC Blood Culture adequate volume   Culture  Setup Time   Final    GRAM  NEGATIVE RODS IN BOTH AEROBIC AND ANAEROBIC BOTTLES CRITICAL RESULT CALLED TO, READ BACK BY AND VERIFIED WITH: Noel Christmas  [facility] Performed at [facility], [address] [place], [place] [address]    Culture ESCHERICHIA COLI (A)  Final   Report Status 11/02/2023 FINAL  Final   Organism ID, Bacteria ESCHERICHIA COLI  Final   Organism ID, Bacteria ESCHERICHIA COLI  Final      Susceptibility   Escherichia coli - KIRBY BAUER*    CEFAZOLIN SENSITIVE Sensitive    Escherichia coli - MIC*    AMPICILLIN <=2 SENSITIVE Sensitive     CEFEPIME <=0.12 SENSITIVE Sensitive     CEFTAZIDIME <=1 SENSITIVE Sensitive     CEFTRIAXONE <=0.25 SENSITIVE Sensitive     CIPROFLOXACIN <=0.25 SENSITIVE Sensitive     GENTAMICIN <=1 SENSITIVE Sensitive     IMIPENEM <=0.25 SENSITIVE Sensitive     TRIMETH/SULFA <=20 SENSITIVE Sensitive     AMPICILLIN/SULBACTAM <=2 SENSITIVE Sensitive     PIP/TAZO <=4 SENSITIVE Sensitive ug/mL    * ESCHERICHIA COLI    ESCHERICHIA COLI  Blood Culture ID Panel (Reflexed)     Status: Abnormal   Collection Time: 10/31/23  9:02 AM  Result Value Ref Range Status   Enterococcus faecalis NOT DETECTED NOT DETECTED Final   Enterococcus Faecium NOT DETECTED NOT DETECTED Final   Listeria monocytogenes NOT DETECTED NOT DETECTED Final   Staphylococcus species NOT DETECTED NOT DETECTED Final   Staphylococcus aureus (BCID) NOT  DETECTED NOT DETECTED Final   Staphylococcus epidermidis NOT DETECTED NOT DETECTED Final   Staphylococcus lugdunensis NOT DETECTED NOT DETECTED Final   Streptococcus species NOT DETECTED NOT DETECTED Final   Streptococcus agalactiae NOT DETECTED NOT DETECTED Final   Streptococcus pneumoniae NOT DETECTED NOT DETECTED Final   Streptococcus pyogenes NOT DETECTED NOT DETECTED Final   A.calcoaceticus-baumannii NOT DETECTED NOT DETECTED Final   Bacteroides fragilis NOT DETECTED NOT DETECTED Final   Enterobacterales DETECTED (A) NOT DETECTED Final    Comment:  Enterobacterales represent a large order of gram negative bacteria, not a single organism. CRITICAL RESULT CALLED TO, READ BACK BY AND VERIFIED WITH: J WYLAND,PHARMD@2350  11/01/23 MK    Enterobacter cloacae complex NOT DETECTED NOT DETECTED Final   Escherichia coli DETECTED (A) NOT DETECTED Final    Comment: CRITICAL RESULT CALLED TO, READ BACK BY AND VERIFIED WITH: J WYLAND,PHARMD@2350  10/31/23 MK    Klebsiella aerogenes NOT DETECTED NOT DETECTED Final   Klebsiella oxytoca NOT DETECTED NOT DETECTED Final   Klebsiella pneumoniae NOT DETECTED NOT DETECTED Final   Proteus species NOT DETECTED NOT DETECTED Final   Salmonella species NOT DETECTED NOT DETECTED Final   Serratia marcescens NOT DETECTED NOT DETECTED Final   Haemophilus influenzae NOT DETECTED NOT DETECTED Final   Neisseria meningitidis NOT DETECTED NOT DETECTED Final   Pseudomonas aeruginosa NOT DETECTED NOT DETECTED Final   Stenotrophomonas maltophilia NOT DETECTED NOT DETECTED Final   Candida albicans NOT DETECTED NOT DETECTED Final   Candida auris NOT DETECTED NOT DETECTED Final   Candida glabrata NOT DETECTED NOT DETECTED Final   Candida krusei NOT DETECTED NOT DETECTED Final   Candida parapsilosis NOT DETECTED NOT DETECTED Final   Candida tropicalis NOT DETECTED NOT DETECTED Final   Cryptococcus neoformans/gattii NOT DETECTED NOT DETECTED Final   CTX-M ESBL NOT DETECTED NOT DETECTED Final   Carbapenem resistance IMP NOT DETECTED NOT DETECTED Final   Carbapenem resistance KPC NOT DETECTED NOT DETECTED Final   Carbapenem resistance NDM NOT DETECTED NOT DETECTED Final   Carbapenem resist OXA 48 LIKE NOT DETECTED NOT DETECTED Final   Carbapenem resistance VIM NOT DETECTED NOT DETECTED Final    Comment: Performed at [facility], [address] [place], [place] [address]     Labs: BNP (last 3 results) Recent Labs    11/03/23 0427  BNP 287.6*   Basic Metabolic Panel: Recent Labs  Lab 11/01/23 0415  11/02/23 0740 11/03/23 0427 11/04/23 0417 11/05/23 0729  NA 137 140 138 139 138  K 4.2 3.8 3.8 4.1 4.1  CL 110 110 109 110 111  CO2 19* 18* 20* 21* 20*  GLUCOSE 117* 97 104* 108* 95  BUN 43* 41* 35* 31* 30*  CREATININE 2.13* 1.72* 1.90* 1.67* 1.60*  CALCIUM 8.7* 8.5* 8.5* 8.5* 8.3*  MG  --   --   --  2.1 2.0   Liver Function Tests: Recent Labs  Lab 10/31/23 0858 11/01/23 0415 11/03/23 0427  AST 62* 38 84*  ALT 96* 70* 95*  ALKPHOS 164* 143* 254*  BILITOT 1.4* 1.1 1.1  PROT 5.9* 5.1* 5.1*  ALBUMIN 2.5* 2.1* 2.0*   No results for input(s): "LIPASE", "AMYLASE" in the last 168 hours. No results for input(s): "AMMONIA" in the last 168 hours. CBC: Recent Labs  Lab 10/31/23 0858 11/01/23 0415 11/02/23 0740 11/03/23 0427 11/04/23 0417 11/05/23 0729  WBC 18.6* 20.5* 13.5* 12.3* 13.2* 13.2*  NEUTROABS 14.5*  --   --   --   --   --  HGB 10.8* 9.3* 9.4* 9.4* 8.9* 9.3*  HCT 32.2* 28.0* 28.1* 28.1* 27.1* 27.4*  MCV 93.6 93.6 93.0 93.4 95.1 92.9  PLT 67* 66* 75* 92* 103* 121*   Cardiac Enzymes: No results for input(s): "CKTOTAL", "CKMB", "CKMBINDEX", "TROPONINI" in the last 168 hours. BNP: Invalid input(s): "POCBNP" CBG: Recent Labs  Lab 11/02/23 0737  GLUCAP 100*   D-Dimer No results for input(s): "DDIMER" in the last 72 hours. Hgb A1c No results for input(s): "HGBA1C" in the last 72 hours. Lipid Profile No results for input(s): "CHOL", "HDL", "LDLCALC", "TRIG", "CHOLHDL", "LDLDIRECT" in the last 72 hours. Thyroid function studies No results for input(s): "TSH", "T4TOTAL", "T3FREE", "THYROIDAB" in the last 72 hours.  Invalid input(s): "FREET3" Anemia work up No results for input(s): "VITAMINB12", "FOLATE", "FERRITIN", "TIBC", "IRON", "RETICCTPCT" in the last 72 hours. Urinalysis    Component Value Date/Time   COLORURINE AMBER (A) 10/31/2023 0859   APPEARANCEUR CLOUDY (A) 10/31/2023 0859   LABSPEC 1.019 10/31/2023 0859   PHURINE 5.0 10/31/2023 0859    GLUCOSEU NEGATIVE 10/31/2023 0859   GLUCOSEU NEGATIVE 06/21/2023 1333   HGBUR MODERATE (A) 10/31/2023 0859   BILIRUBINUR NEGATIVE 10/31/2023 0859   KETONESUR NEGATIVE 10/31/2023 0859   PROTEINUR 100 (A) 10/31/2023 0859   UROBILINOGEN 0.2 06/21/2023 1333   NITRITE NEGATIVE 10/31/2023 0859   LEUKOCYTESUR MODERATE (A) 10/31/2023 0859   Sepsis Labs Recent Labs  Lab 11/02/23 0740 11/03/23 0427 11/04/23 0417 11/05/23 0729  WBC 13.5* 12.3* 13.2* 13.2*   Microbiology Recent Results (from the past 240 hours)  Resp panel by RT-PCR (RSV, Flu A&B, Covid) Anterior Nasal Swab     Status: None   Collection Time: 10/31/23  8:57 AM   Specimen: Anterior Nasal Swab  Result Value Ref Range Status   SARS Coronavirus 2 by RT PCR NEGATIVE NEGATIVE Final   Influenza A by PCR NEGATIVE NEGATIVE Final   Influenza B by PCR NEGATIVE NEGATIVE Final    Comment: (NOTE) The Xpert Xpress SARS-CoV-2/FLU/RSV plus assay is intended as an aid in the diagnosis of influenza from Nasopharyngeal swab specimens and should not be used as a sole basis for treatment. Nasal washings and aspirates are unacceptable for Xpert Xpress SARS-CoV-2/FLU/RSV testing.  Fact Sheet for Patients: BloggerCourse.com  Fact Sheet for Healthcare Providers: SeriousBroker.it  This test is not yet approved or cleared by the [place] FDA and has been authorized for detection and/or diagnosis of SARS-CoV-2 by FDA under an Emergency Use Authorization (EUA). This EUA will remain in effect (meaning this test can be used) for the duration of the COVID-19 declaration under Section 564(b)(1) of the Act, 21 U.S.C. section 360bbb-3(b)(1), unless the authorization is terminated or revoked.     Resp Syncytial Virus by PCR NEGATIVE NEGATIVE Final    Comment: (NOTE) Fact Sheet for Patients: BloggerCourse.com  Fact Sheet for Healthcare  Providers: SeriousBroker.it  This test is not yet approved or cleared by the [place] FDA and has been authorized for detection and/or diagnosis of SARS-CoV-2 by FDA under an Emergency Use Authorization (EUA). This EUA will remain in effect (meaning this test can be used) for the duration of the COVID-19 declaration under Section 564(b)(1) of the Act, 21 U.S.C. section 360bbb-3(b)(1), unless the authorization is terminated or revoked.  Performed at [facility], [address], [place], [place] [address]   Blood Culture (routine x 2)     Status: Abnormal   Collection Time: 10/31/23  8:57 AM   Specimen: BLOOD RIGHT FOREARM  Result Value Ref Range Status   Specimen Description BLOOD RIGHT FOREARM  Final   Special Requests   Final    BOTTLES DRAWN AEROBIC AND ANAEROBIC Blood Culture adequate volume   Culture  Setup Time   Final    GRAM NEGATIVE RODS IN BOTH AEROBIC AND ANAEROBIC BOTTLES CRITICAL VALUE NOTED.  VALUE IS CONSISTENT WITH PREVIOUSLY REPORTED AND CALLED VALUE.    Culture (A)  Final    ESCHERICHIA COLI SUSCEPTIBILITIES PERFORMED ON PREVIOUS CULTURE WITHIN THE LAST 5 DAYS. Performed at [facility], [address], [place], [place] [address]    Report Status 11/02/2023 FINAL  Final  Urine Culture     Status: Abnormal   Collection Time: 10/31/23  8:59 AM   Specimen: Urine, Random  Result Value Ref Range Status   Specimen Description URINE, RANDOM  Final   Special Requests   Final    NONE Reflexed from [id number] Performed at [facility], [address] [place], [place] [address]    Culture (A)  Final    60,000 COLONIES/mL ESCHERICHIA COLI 40,000 COLONIES/mL ENTEROCOCCUS FAECALIS    Report Status 11/02/2023 FINAL  Final   Organism ID, Bacteria ESCHERICHIA COLI (A)  Final   Organism ID, Bacteria ENTEROCOCCUS FAECALIS (A)  Final      Susceptibility   Escherichia coli - MIC*    AMPICILLIN <=2 SENSITIVE Sensitive      CEFAZOLIN <=4 SENSITIVE Sensitive     CEFEPIME <=0.12 SENSITIVE Sensitive     CEFTRIAXONE <=0.25 SENSITIVE Sensitive     CIPROFLOXACIN <=0.25 SENSITIVE Sensitive     GENTAMICIN <=1 SENSITIVE Sensitive     IMIPENEM <=0.25 SENSITIVE Sensitive     NITROFURANTOIN <=16 SENSITIVE Sensitive     TRIMETH/SULFA <=20 SENSITIVE Sensitive     AMPICILLIN/SULBACTAM <=2 SENSITIVE Sensitive     PIP/TAZO <=4 SENSITIVE Sensitive ug/mL    * 60,000 COLONIES/mL ESCHERICHIA COLI   Enterococcus faecalis - MIC*    AMPICILLIN <=2 SENSITIVE Sensitive     NITROFURANTOIN <=16 SENSITIVE Sensitive     VANCOMYCIN 2 SENSITIVE Sensitive     * 40,000 COLONIES/mL ENTEROCOCCUS FAECALIS  Blood Culture (routine x 2)     Status: Abnormal   Collection Time: 10/31/23  9:02 AM   Specimen: BLOOD LEFT FOREARM  Result Value Ref Range Status   Specimen Description BLOOD LEFT FOREARM  Final   Special Requests   Final    BOTTLES DRAWN AEROBIC AND ANAEROBIC Blood Culture adequate volume   Culture  Setup Time   Final    GRAM NEGATIVE RODS IN BOTH AEROBIC AND ANAEROBIC BOTTLES CRITICAL RESULT CALLED TO, READ BACK BY AND VERIFIED WITH: Marlane Mingle  [facility] Performed at [facility], [address] [place], [place] [address]    Culture ESCHERICHIA COLI (A)  Final   Report Status 11/02/2023 FINAL  Final   Organism ID, Bacteria ESCHERICHIA COLI  Final   Organism ID, Bacteria ESCHERICHIA COLI  Final      Susceptibility   Escherichia coli - KIRBY BAUER*    CEFAZOLIN SENSITIVE Sensitive    Escherichia coli - MIC*    AMPICILLIN <=2 SENSITIVE Sensitive     CEFEPIME <=0.12 SENSITIVE Sensitive     CEFTAZIDIME <=1 SENSITIVE Sensitive     CEFTRIAXONE <=0.25 SENSITIVE Sensitive     CIPROFLOXACIN <=0.25 SENSITIVE Sensitive     GENTAMICIN <=1 SENSITIVE Sensitive     IMIPENEM <=0.25 SENSITIVE Sensitive     TRIMETH/SULFA <=20 SENSITIVE Sensitive  AMPICILLIN/SULBACTAM <=2 SENSITIVE Sensitive     PIP/TAZO <=4 SENSITIVE Sensitive  ug/mL    * ESCHERICHIA COLI    ESCHERICHIA COLI  Blood Culture ID Panel (Reflexed)     Status: Abnormal   Collection Time: 10/31/23  9:02 AM  Result Value Ref Range Status   Enterococcus faecalis NOT DETECTED NOT DETECTED Final   Enterococcus Faecium NOT DETECTED NOT DETECTED Final   Listeria monocytogenes NOT DETECTED NOT DETECTED Final   Staphylococcus species NOT DETECTED NOT DETECTED Final   Staphylococcus aureus (BCID) NOT DETECTED NOT DETECTED Final   Staphylococcus epidermidis NOT DETECTED NOT DETECTED Final   Staphylococcus lugdunensis NOT DETECTED NOT DETECTED Final   Streptococcus species NOT DETECTED NOT DETECTED Final   Streptococcus agalactiae NOT DETECTED NOT DETECTED Final   Streptococcus pneumoniae NOT DETECTED NOT DETECTED Final   Streptococcus pyogenes NOT DETECTED NOT DETECTED Final   A.calcoaceticus-baumannii NOT DETECTED NOT DETECTED Final   Bacteroides fragilis NOT DETECTED NOT DETECTED Final   Enterobacterales DETECTED (A) NOT DETECTED Final    Comment: Enterobacterales represent a large order of gram negative bacteria, not a single organism. CRITICAL RESULT CALLED TO, READ BACK BY AND VERIFIED WITH: J [facility]  11/01/23 MK    Enterobacter cloacae complex NOT DETECTED NOT DETECTED Final   Escherichia coli DETECTED (A) NOT DETECTED Final    Comment: CRITICAL RESULT CALLED TO, READ BACK BY AND VERIFIED WITH: J WYLAND,PHARMD@2350  10/31/23 MK    Klebsiella aerogenes NOT DETECTED NOT DETECTED Final   Klebsiella oxytoca NOT DETECTED NOT DETECTED Final   Klebsiella pneumoniae NOT DETECTED NOT DETECTED Final   Proteus species NOT DETECTED NOT DETECTED Final   Salmonella species NOT DETECTED NOT DETECTED Final   Serratia marcescens NOT DETECTED NOT DETECTED Final   Haemophilus influenzae NOT DETECTED NOT DETECTED Final   Neisseria meningitidis NOT DETECTED NOT DETECTED Final   Pseudomonas aeruginosa NOT DETECTED NOT DETECTED Final   Stenotrophomonas  maltophilia NOT DETECTED NOT DETECTED Final   Candida albicans NOT DETECTED NOT DETECTED Final   Candida auris NOT DETECTED NOT DETECTED Final   Candida glabrata NOT DETECTED NOT DETECTED Final   Candida krusei NOT DETECTED NOT DETECTED Final   Candida parapsilosis NOT DETECTED NOT DETECTED Final   Candida tropicalis NOT DETECTED NOT DETECTED Final   Cryptococcus neoformans/gattii NOT DETECTED NOT DETECTED Final   CTX-M ESBL NOT DETECTED NOT DETECTED Final   Carbapenem resistance IMP NOT DETECTED NOT DETECTED Final   Carbapenem resistance KPC NOT DETECTED NOT DETECTED Final   Carbapenem resistance NDM NOT DETECTED NOT DETECTED Final   Carbapenem resist OXA 48 LIKE NOT DETECTED NOT DETECTED Final   Carbapenem resistance VIM NOT DETECTED NOT DETECTED Final    Comment: Performed at [facility], [address], [place], [place] [address]     Time coordinating discharge:  I have spent 35 minutes face to face with the patient and on the ward discussing the patients care, assessment, plan and disposition with other care givers. >50% of the time was devoted counseling the patient about the risks and benefits of treatment/Discharge disposition and coordinating care.   SIGNED:   Miguel Rota, MD  [facility] 11/05/2023, 11:15 AM   If 7PM-7AM, please contact night-coverage

## 2023-11-05 NOTE — TOC Progression Note (Signed)
 Transition of Care [facility]) - Progression Note    Patient Details  Name: Curtis Jacobs MRN: [id number] Date of Birth: 11-Nov-1932  [facility] CM/SW Contact  Gordy Clement, RN Phone Number: 11/05/2023, 10:25 AM  Clinical Narrative:     Patient being recommended HH PT and OT Frances Furbish will provide services. Patient has the recommended tub/shower aeat in the home already.  AVS has been updated   Patient will need transport home at DC and will need assistance getting into home  TOC will continue to follow patient for any additional discharge needs            Expected Discharge Plan and Services                                               Social Determinants of Health (SDOH) Interventions SDOH Screenings   Food Insecurity: No Food Insecurity (10/31/2023)  Housing: Low Risk  (10/31/2023)  Transportation Needs: No Transportation Needs (10/31/2023)  Utilities: Not At Risk (10/31/2023)  Alcohol Screen: Low Risk  (05/27/2020)  Depression (PHQ2-9): Low Risk  (06/21/2023)  Financial Resource Strain: Low Risk  (04/05/2023)  Physical Activity: Sufficiently Active (04/05/2023)  Social Connections: Socially Integrated (10/31/2023)  Stress: No Stress Concern Present (04/05/2023)  Tobacco Use: Medium Risk (10/31/2023)  Health Literacy: Adequate Health Literacy (04/05/2023)    Readmission Risk Interventions     No data to display

## 2023-11-05 NOTE — Plan of Care (Signed)

## 2023-11-06 LAB — TYPE AND SCREEN
ABO/RH(D): O POS
Antibody Screen: NEGATIVE
Unit division: 0

## 2023-11-06 LAB — BPAM RBC
Blood Product Expiration Date: 202504162359
Unit Type and Rh: 5100

## 2023-11-08 ENCOUNTER — Telehealth: Payer: Self-pay | Admitting: *Deleted

## 2023-11-08 DIAGNOSIS — A419 Sepsis, unspecified organism: Secondary | ICD-10-CM

## 2023-11-08 NOTE — Transitions of Care (Post Inpatient/ED Visit) (Signed)
 11/08/2023  Name: Curtis Jacobs MRN: [id number] DOB: 03-03-1933  Today's TOC FU Call Status: Today's TOC FU Call Status:: Successful TOC FU Call Completed TOC FU Call Complete Date: 11/08/23 Patient's Name and Date of Birth confirmed.  Transition Care Management Follow-up Telephone Call Date of Discharge: 11/05/23 Discharge Facility: [name]) How have you been since you were released from the hospital?: Better (I am here. I ffeel like I have been run over by a truck) Any questions or concerns?: Yes Patient Questions/Concerns:: Patient had not heard from [facility] Patient Questions/Concerns Addressed: Other: (RN gave patient and  wife the number to call and see when they are coming)  Items Reviewed: Did you receive and understand the discharge instructions provided?: Yes Medications obtained,verified, and reconciled?: Yes (Medications Reviewed) Any new allergies since your discharge?: No Dietary orders reviewed?: No Do you have support at home?: Yes People in Home: spouse Name of Support/Comfort Primary Source: Deanna  Medications Reviewed Today: Medications Reviewed Today     Reviewed by Luella Cook, RN (Case Manager) on 11/08/23 at 1023  Med List Status: <None>   Medication Order Taking? Sig Documenting Provider Last Dose Status Informant  albuterol (VENTOLIN HFA) 108 (90 Base) MCG/ACT inhaler [id number] Yes Inhale 2 puffs into the lungs every 6 (six) hours as needed for wheezing or shortness of breath. Miguel Rota, MD Taking Active   amLODipine (NORVASC) 2.5 MG tablet [id number]  Take 1 tablet (2.5 mg total) by mouth daily.  Patient not taking: Reported on 10/31/2023   Ronney Asters, NP  Expired 10/03/23 2359   amoxicillin (AMOXIL) 500 MG capsule [id number] Yes Take 2 capsules (1,000 mg total) by mouth every 8 (eight) hours for 13 days. Miguel Rota, MD Taking Active   aspirin EC 81 MG tablet [id number] Yes Take 1 tablet (81 mg total) by mouth daily. Swallow  whole. Lewayne Bunting, MD Taking Active Self, Pharmacy Records  cetirizine (ZYRTEC) 10 MG tablet [id number] Yes Take 1 tablet by mouth daily. [provider] Taking Active Self, Pharmacy Records  ferrous sulfate 324 MG TBEC [id number] Yes Take 324 mg by mouth. Nature Made [provider] Taking Active Self, Pharmacy Records  finasteride (PROSCAR) 5 MG tablet [id number] Yes Take 5 mg by mouth daily. [provider] Taking Active Self, Pharmacy Records           Med Note Lia Hopping, Marylen Ponto   Sun Oct 31, 2023 11:38 AM) This was just started recently  fluocinonide cream (LIDEX) 0.05 % [id number] Yes Apply 1 application topically daily. [provider] Taking Active Self, Pharmacy Records           Med Note Caro Laroche Jul 05, 2023  3:55 PM) Pt takes as needed  guaiFENesin (MUCINEX) 600 MG 12 hr tablet [id number] Yes Take 1 tablet (600 mg total) by mouth 2 (two) times daily. Miguel Rota, MD Taking Active   Multiple Vitamin (MULTIVITAMIN) tablet [id number] Yes Take 1 tablet by mouth daily. [provider] Taking Active Self, Pharmacy Records  pantoprazole (PROTONIX) 40 MG tablet [id number] Yes Take 1 tablet (40 mg total) by mouth daily before breakfast. Nelson Chimes, Ankit C, MD Taking Active   rosuvastatin (CRESTOR) 20 MG tablet [id number] Yes TAKE 1 TABLET(20 MG) BY MOUTH DAILY Crenshaw, Madolyn Frieze, MD Taking Active Self, Pharmacy Records  terazosin (HYTRIN) 10 MG capsule [id number] Yes TAKE 1 CAPSULE BY MOUTH EVERY NIGHT AT BEDTIME. ANNUAL APPT DUE IN [place]. Plotnikov,  [name], MD Taking Active Self, Pharmacy Records            Home Care and Equipment/Supplies: Were Home Health Services Ordered?: Yes Name of Home Health Agency:: [facility] Has Agency set up a time to come to your home?: No EMR reviewed for Home Health Orders: Orders present/patient has not received call (refer to CM for follow-up) Any new equipment or medical supplies ordered?:  NA  Functional Questionnaire: Do you need assistance with bathing/showering or dressing?: Yes Do you need assistance with meal preparation?: Yes Do you need assistance with eating?: No Do you have difficulty maintaining continence: No Do you need assistance with getting out of bed/getting out of a chair/moving?: Yes Do you have difficulty managing or taking your medications?: No  Follow up appointments reviewed: PCP Follow-up appointment confirmed?: Yes Date of PCP follow-up appointment?: 11/18/23 Follow-up Provider: [facility] Follow-up appointment confirmed?: NA Do you need transportation to your follow-up appointment?: No Do you understand care options if your condition(s) worsen?: Yes-patient verbalized understanding  SDOH Interventions Today    Flowsheet Row Most Recent Value  SDOH Interventions   Food Insecurity Interventions Intervention Not Indicated  Housing Interventions Intervention Not Indicated  Transportation Interventions Intervention Not Indicated, Patient Resources (Friends/Family)  Utilities Interventions Intervention Not Indicated       Goals Addressed             This Visit's Progress    TOC Care Plan       Current Barriers:  Knowledge Deficits related to plan of care for management of Sepsis due to urinary tract infection  Transportation barriers  RNCM Clinical Goal(s):  Patient will work with the Care Management team over the next 30 days to address Transition of Care Barriers: Medication Management Transportation take all medications exactly as prescribed and will call provider for medication related questions as evidenced by Electronic Medical Record attend all scheduled medical appointments: PCP and Specialists as evidenced by Electronic Medical Record  through collaboration with RN Care manager, provider, and care team.   Interventions: Evaluation of current treatment plan related to  self management and patient's  adherence to plan as established by provider  Transitions of Care:  New goal. Doctor Visits  - discussed the importance of doctor visits  Patient Goals/Self-Care Activities: Participate in Transition of Care Program/Attend [facility] scheduled calls Notify RN Care Manager of TOC call rescheduling needs Take all medications as prescribed Attend all scheduled provider appointments Call pharmacy for medication refills 3-7 days in advance of running out of medications  Follow Up Plan:  Telephone follow up appointment with care management team member scheduled for:  Ricke Hey Case Manager [phone] The patient has been provided with contact information for the care management team and has been advised to call with any health related questions or concerns.  Next PCP appointment scheduled for: [id number]         Interventions Today    Flowsheet Row Most Recent Value  Chronic Disease   Chronic disease during today's visit Other  [Sepsis due to urinary tract infection]  General Interventions   General Interventions Discussed/Reviewed General Interventions Discussed, General Interventions Reviewed, Doctor Visits  Doctor Visits Discussed/Reviewed Doctor Visits Discussed, Doctor Visits Reviewed, PCP  Exercise Interventions   Exercise Discussed/Reviewed Exercise Discussed  Alfreida Steff[place]agen [place] is coming to do PT/OT]  Education Interventions   Education Provided Provided Education  Provided Verbal Education On Medication, Insurance Plans  [Rn explained how the transportation system works and over the counter  medications from their benefits. RN told them to call the customer service number on the back of the card.]  Pharmacy Interventions   Pharmacy Dicussed/Reviewed Pharmacy Topics Discussed, Pharmacy Topics Reviewed      Patient consented to Ricke Hey Case Management follow up outreach calls.  Referred for transportation needs.  Gean Maidens BSN RN [facility] Care Management  Coordinator [email or web address] Direct Dial: [phone]  Fax: [phone] Website: [email or web address]

## 2023-11-09 ENCOUNTER — Telehealth: Payer: Self-pay | Admitting: *Deleted

## 2023-11-09 NOTE — Progress Notes (Signed)
 Complex Care Management Note Care Guide Note  11/09/2023 Name: Curtis Jacobs MRN: [id number] DOB: [birth year]  Curtis Jacobs is a 89 y.o. year old male who is a primary care patient of Plotnikov, [name], MD . The community resource team was consulted for assistance with Transportation Needs   SDOH screenings and interventions completed:  Yes     SDOH Interventions Today    Flowsheet Row Most Recent Value  SDOH Interventions   Transportation Interventions SCAT (Specialized Community Area Transporation)  [Mail the appliactions for him and wife to use [place] mobilty]        Care guide performed the following interventions: Patient provided with information about care guide support team and interviewed to confirm resource needs.  Follow Up Plan:  No further follow up planned at this time. The patient has been provided with needed resources.  Encounter Outcome:  Patient Visit Completed  Dione Booze  [facility] Care Guide  Direct Dial:[phone] Fax:[phone] Website: [email or web address]

## 2023-11-16 ENCOUNTER — Other Ambulatory Visit: Payer: Self-pay | Admitting: *Deleted

## 2023-11-16 NOTE — Patient Outreach (Signed)
 Care Management  Transitions of Care Program Transitions of Care Post-discharge week 2/ day # 8   11/16/2023 Name: Curtis Jacobs MRN: [id number] DOB: May 28, 1933  Subjective: Curtis Jacobs is a 89 y.o. year old male who is a primary care patient of Plotnikov, [name], MD. The Care Management team Engaged with patient by telephone to assess and address transitions of care needs.   Consent to Services:  Patient was given information about care management services, agreed to services, and gave verbal consent to participate.   Enrolled into TOC 30-day program:  11/08/23  Assessment: "I don't understand what this call is all about; I don't remember the call you are telling me about last week.  Okay, it's fine for you to call me, I guess for a little while.  I am doing fine; using the walker I have here, for now.  No I did not hear from any physical therapist and now I don't really think I need anything like that.  I am getting out and about and driving myself again.  Still taking the antibiotics for now, have a few more days left.  I had to cancel the appointment with my PCP, so now I will be going on Monday 11/22/23"    Denies clinical concerns and sounds to be in no distress throughout [facility] 30-day program outreach call today          SDOH Interventions    Flowsheet Row Telephone from 11/09/2023 in [place] [facility]UL[facility] DEPARTMENT Telephone from 11/08/2023 in [place] POPULATION HEALTH DEPARTMENT Clinical Support from 04/05/2023 in [facility] Clinical Support from 05/27/2020 in [facility] [place]  SDOH Interventions      Food Insecurity Interventions -- Intervention Not Indicated Intervention Not Indicated Intervention Not Indicated  Housing Interventions -- Intervention Not Indicated Intervention Not Indicated Intervention Not Indicated  Transportation Interventions SCAT (Specialized Community Area Transporation)  Tamela Oddi the  appliactions for him and wife to use [place] mobilty] Intervention Not Indicated, Patient Resources (Friends/Family) Intervention Not Indicated Intervention Not Indicated  Utilities Interventions -- Intervention Not Indicated Intervention Not Indicated --  Alcohol Usage Interventions -- -- Intervention Not Indicated (Score <7) --  Depression Interventions/Treatment  -- -- PHQ2-9 Score <4 Follow-up Not Indicated --  Financial Strain Interventions -- -- Intervention Not Indicated Intervention Not Indicated  Physical Activity Interventions -- -- Intervention Not Indicated Intervention Not Indicated  Stress Interventions -- -- Intervention Not Indicated Intervention Not Indicated  Social Connections Interventions -- -- Intervention Not Indicated Intervention Not Indicated  Health Literacy Interventions -- -- Intervention Not Indicated --        Goals Addressed             This Visit's Progress    TOC Care Plan   On track    Current Barriers:  Knowledge Deficits related to plan of care for management of Sepsis due to urinary tract infection  Transportation barriers 11/16/23: Confirmed patient has resumed driving self; he confirms community resource care guide provided resources for SCAT, but today he denies need for same, states he will continue driving self as "long as" he can Independent; uses walker prn   RNCM Clinical Goal(s):  Patient will work with the Care Management team over the next 30 days to address Transition of Care Barriers: Medication Management Transportation take all medications exactly as prescribed and will call provider for medication related questions as evidenced by Electronic Medical Record attend all  scheduled medical appointments: PCP and Specialists as evidenced by Electronic Medical Record  through collaboration with RN Care manager, provider, and care team.   Interventions: Evaluation of current treatment plan related to  self management and patient's  adherence to plan as established by provider  Transitions of Care:  Goal on track:  Yes.  11/16/23 Durable Medical Equipment (DME) needs assessed with patient/caregiver Doctor Visits  - discussed the importance of doctor visits Communication with PCP re: enrollment into TOC 30-day program completed today (week # 2 call) Discussed current clinical condition:  "I don't understand what this call is all about; I don't remember the call you are telling me about last week.  Okay, it's fine for you to call me, I guess for a little while.  I am doing fine; using the walker I have here, for now.  No I did not hear from any physical therapist and now I don't really think I need anything like that.  I am getting out and about and driving myself again.  Still taking the antibiotics for now, have a few more days left.  I had to cancel the appointment with my PCP, so now I will be going on Monday 11/22/23"  Denies clinical concerns and sounds to be in no distress throughout [facility] 30-day program outreach call today Reviewed upcoming provider office visits: PCP: 11/22/23- day # 17 post-hospital discharge: patient canceled 11/18/23 appointment/ re-scheduled; confirmed patient is aware of all and has plans to attend as scheduled Confirmed no medication concerns; he is self- managing medications and reports 100% adherence to taking medications as prescribed Discussed Home Health PT services- confirmed patient did not contact agency as advised at time of initial TOC call- he now tells me he no longer wants or needs these services and he declines my contacting agency today to follow up Provided education around benefit of conservative post-hospital discharge activity; need to pace activity without over-doing, take time in activity/ not rush Confirmed currently using assistive devices on regular basis -- walker; provided education/ reinforcement around fall prevention  TOC 30-day program initial assessment progression  today Provided my direct contact information should questions/ concerns/ needs arise post-TOC initial call, prior to next TOC 30-day program RN CM telephone visit    Patient Goals/Self-Care Activities: Participate in Transition of Care Program/Attend TOC scheduled calls Take all medications as prescribed Attend all scheduled provider appointments Call provider office for new concerns or questions  Continue pacing activity as your recuperation from recent surgery continues Use assistive devices as needed to prevent falls If you believe your condition is getting worse- contact your care providers (doctors) promptly- reaching out to your doctor early when you have concerns can prevent you from having to go to the hospital  Follow Up Plan:  Telephone follow up appointment with care management team member scheduled for:  Wednesday 11/24/23 The patient has been provided with contact information for the care management team and has been advised to call with any health related questions or concerns.    Plan for next week's call: Review PCP provider office visit scheduled 11/22/23 Review medication adherence- confirm antibiotics completed Progression of TOC 30-day program initial assessment Reinforce/ provide education re: signs/ symptoms UTI along with action plan         Plan: Telephone follow up appointment with care management team member scheduled for:   Wednesday 11/24/23  Total time spent from review to signing of note/ including any care coordination interventions:  58 minutes  Pls call/  message for questions,  Caryl Pina, RN, BSN, CCRN Alumnus RN Care Manager  Transitions of Care  [facility] - [facility] [phone]: direct office

## 2023-11-16 NOTE — Patient Instructions (Signed)
 Visit Information  Thank you for taking time to visit with me today. Please don't hesitate to contact me if I can be of assistance to you before our next scheduled telephone appointment.  Our next appointment is by telephone on Wednesday 11/24/23 at 11:00 am  Please call the care guide team at [phone] if you need to cancel or reschedule your appointment.   Following are the goals we discussed today:  Patient Goals/Self-Care Activities: Participate in Transition of Care Program/Attend TOC scheduled calls Take all medications as prescribed Attend all scheduled provider appointments Call provider office for new concerns or questions  Continue pacing activity as your recuperation from recent surgery continues Use assistive devices as needed to prevent falls If you believe your condition is getting worse- contact your care providers (doctors) promptly- reaching out to your doctor early when you have concerns can prevent you from having to go to the hospital  If you are experiencing a Mental Health or Behavioral Health Crisis or need someone to talk to, please  call the Suicide and Crisis Lifeline: 988 call the [place] National Suicide Prevention Lifeline: 215-464-6472 or TTY: 562-477-0834 TTY 331-740-3308) to talk to a trained counselor call 1-800-273-TALK (toll free, 24 hour hotline) go to [facility] [address] [place] [phone]) call the [facility] Line: (480)428-0099 call 911   The patient verbalized understanding of instructions, educational materials, and care plan provided today and DECLINED offer to receive copy of patient instructions, educational materials, and care plan.   Caryl Pina, RN, BSN, Media planner  Transitions of Care  [facility] - [facility] [phone]: direct office

## 2023-11-18 ENCOUNTER — Inpatient Hospital Stay: Admitting: Internal Medicine

## 2023-11-22 ENCOUNTER — Encounter: Payer: Self-pay | Admitting: Internal Medicine

## 2023-11-22 ENCOUNTER — Ambulatory Visit: Admitting: Internal Medicine

## 2023-11-22 VITALS — BP 135/80 | HR 82 | Temp 98.1°F | Ht 70.0 in | Wt 191.8 lb

## 2023-11-22 DIAGNOSIS — R531 Weakness: Secondary | ICD-10-CM

## 2023-11-22 DIAGNOSIS — E785 Hyperlipidemia, unspecified: Secondary | ICD-10-CM | POA: Diagnosis not present

## 2023-11-22 DIAGNOSIS — N10 Acute tubulo-interstitial nephritis: Secondary | ICD-10-CM | POA: Diagnosis not present

## 2023-11-22 DIAGNOSIS — Z125 Encounter for screening for malignant neoplasm of prostate: Secondary | ICD-10-CM | POA: Diagnosis not present

## 2023-11-22 DIAGNOSIS — N200 Calculus of kidney: Secondary | ICD-10-CM | POA: Diagnosis not present

## 2023-11-22 DIAGNOSIS — J479 Bronchiectasis, uncomplicated: Secondary | ICD-10-CM | POA: Diagnosis not present

## 2023-11-22 DIAGNOSIS — Z0000 Encounter for general adult medical examination without abnormal findings: Secondary | ICD-10-CM

## 2023-11-22 DIAGNOSIS — D61818 Other pancytopenia: Secondary | ICD-10-CM | POA: Diagnosis not present

## 2023-11-22 DIAGNOSIS — I10 Essential (primary) hypertension: Secondary | ICD-10-CM | POA: Diagnosis not present

## 2023-11-22 DIAGNOSIS — R7989 Other specified abnormal findings of blood chemistry: Secondary | ICD-10-CM | POA: Diagnosis not present

## 2023-11-22 DIAGNOSIS — N390 Urinary tract infection, site not specified: Secondary | ICD-10-CM | POA: Insufficient documentation

## 2023-11-22 LAB — URINALYSIS, ROUTINE W REFLEX MICROSCOPIC
Bilirubin Urine: NEGATIVE
Ketones, ur: NEGATIVE
Leukocytes,Ua: NEGATIVE
Nitrite: NEGATIVE
Specific Gravity, Urine: 1.025 (ref 1.000–1.030)
Total Protein, Urine: NEGATIVE
Urine Glucose: NEGATIVE
Urobilinogen, UA: 0.2 (ref 0.0–1.0)
pH: 6 (ref 5.0–8.0)

## 2023-11-22 LAB — CBC WITH DIFFERENTIAL/PLATELET
Basophils Absolute: 0 K/uL (ref 0.0–0.1)
Basophils Relative: 0.9 % (ref 0.0–3.0)
Eosinophils Absolute: 0.2 K/uL (ref 0.0–0.7)
Eosinophils Relative: 3.6 % (ref 0.0–5.0)
HCT: 31.6 % — ABNORMAL LOW (ref 39.0–52.0)
Hemoglobin: 10.5 g/dL — ABNORMAL LOW (ref 13.0–17.0)
Lymphocytes Relative: 27.5 % (ref 12.0–46.0)
Lymphs Abs: 1.2 K/uL (ref 0.7–4.0)
MCHC: 33.2 g/dL (ref 30.0–36.0)
MCV: 94.4 fl (ref 78.0–100.0)
Monocytes Absolute: 0.6 K/uL (ref 0.1–1.0)
Monocytes Relative: 13 % — ABNORMAL HIGH (ref 3.0–12.0)
Neutro Abs: 2.4 K/uL (ref 1.4–7.7)
Neutrophils Relative %: 55 % (ref 43.0–77.0)
Platelets: 117 K/uL — ABNORMAL LOW (ref 150.0–400.0)
RBC: 3.35 Mil/uL — ABNORMAL LOW (ref 4.22–5.81)
RDW: 15.2 % (ref 11.5–15.5)
WBC: 4.4 K/uL (ref 4.0–10.5)

## 2023-11-22 LAB — COMPREHENSIVE METABOLIC PANEL WITH GFR
ALT: 16 U/L (ref 0–53)
AST: 16 U/L (ref 0–37)
Albumin: 3.9 g/dL (ref 3.5–5.2)
Alkaline Phosphatase: 150 U/L — ABNORMAL HIGH (ref 39–117)
BUN: 23 mg/dL (ref 6–23)
CO2: 27 meq/L (ref 19–32)
Calcium: 9.5 mg/dL (ref 8.4–10.5)
Chloride: 107 meq/L (ref 96–112)
Creatinine, Ser: 1.37 mg/dL (ref 0.40–1.50)
GFR: 46.09 mL/min — ABNORMAL LOW (ref >60.00)
Glucose, Bld: 122 mg/dL — ABNORMAL HIGH (ref 70–99)
Potassium: 4.1 meq/L (ref 3.5–5.1)
Sodium: 142 meq/L (ref 135–145)
Total Bilirubin: 0.5 mg/dL (ref 0.2–1.2)
Total Protein: 6.6 g/dL (ref 6.0–8.3)

## 2023-11-22 LAB — LIPID PANEL
Cholesterol: 129 mg/dL (ref 0–200)
HDL: 36.2 mg/dL — ABNORMAL LOW
LDL Cholesterol: 66 mg/dL (ref 0–99)
NonHDL: 92.9
Total CHOL/HDL Ratio: 4
Triglycerides: 137 mg/dL (ref 0.0–149.0)
VLDL: 27.4 mg/dL (ref 0.0–40.0)

## 2023-11-22 LAB — TSH: TSH: 1.45 u[IU]/mL (ref 0.35–5.50)

## 2023-11-22 LAB — PSA: PSA: 17.1 ng/mL — ABNORMAL HIGH (ref 0.10–4.00)

## 2023-11-22 MED ORDER — AMOXICILLIN 500 MG PO CAPS: 1000.0000 mg | ORAL_CAPSULE | Freq: Two times a day (BID) | ORAL | 1 refills | Status: DC

## 2023-11-22 NOTE — Patient Instructions (Signed)
-   Take probiotics.

## 2023-11-22 NOTE — Progress Notes (Signed)
 Subjective:  Patient ID: Curtis Jacobs, male    DOB: June 01, 1933  Age: 89 y.o. MRN: [id number]  CC: Hospitalization Follow-up [facility] Follow Up, [name] 11/05/2023. Initially sent to the hospital due to extreme fatigue. Would like to inquire on newly prescribed ventolin inhaler, working well for patient but would like approval on continuation. Patient also prescribed Finasteride via Urologist and would like to make sure this works well )   HPI [name] presents for a post-hospital f/u.  Per hx: "Admit date: 10/31/2023 Discharge date: 11/05/2023   Admitted From: Home Disposition: Home with home health   Recommendations for Outpatient Follow-up:  Follow up with PCP in 1-2 weeks Please obtain BMP/CBC in one week your next doctors visit.  Albuterol inhaler added. 2 weeks of amoxicillin added PPI daily Mucinex added.  Outpatient follow-up for further workup     Discharge Condition: Stable CODE STATUS: Full code Diet recommendation: Heart healthy   Brief/Interim Summary: Brief Narrative:   89 y.o. male with medical history significant of hypertension, hyperlipidemia, CAD, BPH presents with complaints of urinary retention and weakness following a recent cystoscopy.  Admitted for sepsis secondary to pyelonephritis with renal stone.  No evidence of hydronephrosis noted.  Urine cultures growing E. coli, Enterococcus therefore switched to ampicillin.  Patient has now been transitioned to p.o. amoxicillin.  Due to complaints of orthopnea/cough he was started on bronchodilators, PPI and Mucinex.  Echocardiogram showed preserved EF with mild pericardial effusion without evidence of tamponade.  PT/OT recommended home health services therefore arrangements be made.  Urology will eventually follow-up outpatient.   Patient is medically stable for discharge today.     Assessment & Plan:  Principal Problem:   Sepsis due to urinary tract infection (HCC) Active Problems:   Nephrolithiasis    Acute kidney injury superimposed on chronic kidney disease (HCC)   Fall at home, initial encounter   Generalized weakness   Multiple pulmonary nodules   Bronchiectasis without acute exacerbation (HCC)   Elevated liver function tests   Pancytopenia (HCC)   Essential hypertension   Dyslipidemia   BPH (benign prostatic hyperplasia)   Sepsis due to urinary tract infection (HCC) Nephrolithiasis. Blood cultures growing pansensitive E. coli and urine cultures with E. coli and Enterococcus.  History of recent cystoscopy. CT with some left renal pelvic and bladder calculi-Per urology no hydronephrosis therefore no acute intervention needed. -On IV ampicillin, now on p.o. amoxicillin total 2 weeks   Acute kidney injury superimposed on chronic kidney disease (HCC) Secondary to urosepsis.  Baseline 1.3, admission 2.0 to.  Creatinine now stable at 1.6.   Cough and orthopnea - Improved.  Echocardiogram shows preserved EF mild pericardial effusion without tamponade.  Will discharge him on PPI, Mucinex and albuterol inhaler.  He needs to follow-up outpatient with PCP   Fall at home, initial encounter Seen by PT/OT.  Home health F2F done.   Bronchiectasis without acute exacerbation (HCC) Bronchodilators, I-S/flutter valve   Multiple pulmonary nodules -Outpatient follow-up with pulmonology   Elevated liver function tests Relatively stable.  Needs to follow-up outpatient   Pancytopenia (HCC) Likely from BMS from sepsis and dilution..  Hemoglobin stable around 9.5.  Platelets are now recovered   Essential hypertension Norvasc.  IV as needed   Dyslipidemia With home statin   BPH (benign prostatic hyperplasia) -Continue home terazosin, finasteride   PT/OT-home with home health   DVT prophylaxis: SCDs Start: 10/31/23 1226    Code Status: Full Code Family Communication:   Status  is: Inpatient Remains inpatient appropriate because: Discharge"    Outpatient Medications Prior to Visit   Medication Sig Dispense Refill   albuterol (VENTOLIN HFA) 108 (90 Base) MCG/ACT inhaler Inhale 2 puffs into the lungs every 6 (six) hours as needed for wheezing or shortness of breath. 18 g 0   aspirin EC 81 MG tablet Take 1 tablet (81 mg total) by mouth daily. Swallow whole. 90 tablet 3   cetirizine (ZYRTEC) 10 MG tablet Take 1 tablet by mouth daily.     ferrous sulfate 324 MG TBEC Take 324 mg by mouth. Nature Made     finasteride (PROSCAR) 5 MG tablet Take 5 mg by mouth daily.     fluocinonide cream (LIDEX) 0.05 % Apply 1 application topically daily.     guaiFENesin (MUCINEX) 600 MG 12 hr tablet Take 1 tablet (600 mg total) by mouth 2 (two) times daily. 60 tablet 0   Multiple Vitamin (MULTIVITAMIN) tablet Take 1 tablet by mouth daily.     pantoprazole (PROTONIX) 40 MG tablet Take 1 tablet (40 mg total) by mouth daily before breakfast. 30 tablet 0   rosuvastatin (CRESTOR) 20 MG tablet TAKE 1 TABLET(20 MG) BY MOUTH DAILY 90 tablet 3   terazosin (HYTRIN) 10 MG capsule TAKE 1 CAPSULE BY MOUTH EVERY NIGHT AT BEDTIME. ANNUAL APPT DUE IN [place]. 90 capsule 3   amLODipine (NORVASC) 2.5 MG tablet Take 1 tablet (2.5 mg total) by mouth daily. (Patient not taking: Reported on 10/31/2023) 90 tablet 3   No facility-administered medications prior to visit.    ROS: Review of Systems  Constitutional:  Positive for fatigue and unexpected weight change. Negative for appetite change, chills and diaphoresis.  HENT:  Negative for congestion, nosebleeds, sneezing, sore throat and trouble swallowing.   Eyes:  Negative for itching and visual disturbance.  Respiratory:  Negative for cough and wheezing.   Cardiovascular:  Negative for chest pain, palpitations and leg swelling.  Gastrointestinal:  Negative for abdominal distention, blood in stool, diarrhea and nausea.  Endocrine: Negative for polydipsia.  Genitourinary:  Negative for decreased urine volume, frequency, hematuria and urgency.  Musculoskeletal:   Positive for back pain and gait problem. Negative for joint swelling and neck pain.  Skin:  Negative for rash and wound.  Neurological:  Negative for dizziness, tremors, speech difficulty, weakness and light-headedness.  Hematological:  Does not bruise/bleed easily.  Psychiatric/Behavioral:  Negative for agitation, dysphoric mood, sleep disturbance and suicidal ideas. The patient is not nervous/anxious.     Objective:  BP 135/80   Pulse 82   Temp 98.1 F (36.7 C)   Ht 5\' 10"  (1.778 m)   Wt 191 lb 12.8 oz (87 kg)   SpO2 96%   BMI 27.52 kg/m   BP Readings from Last 3 Encounters:  11/22/23 135/80  11/05/23 (!) 150/70  10/07/23 134/60    Wt Readings from Last 3 Encounters:  11/22/23 191 lb 12.8 oz (87 kg)  10/31/23 190 lb (86.2 kg)  10/07/23 200 lb (90.7 kg)    Physical Exam Constitutional:      General: He is not in acute distress.    Appearance: Normal appearance. He is well-developed. He is not toxic-appearing or diaphoretic.     Comments: NAD  Eyes:     Conjunctiva/sclera: Conjunctivae normal.     Pupils: Pupils are equal, round, and reactive to light.  Neck:     Thyroid: No thyromegaly.     Vascular: No JVD.  Cardiovascular:  Rate and Rhythm: Normal rate and regular rhythm.     Heart sounds: Normal heart sounds. No murmur heard.    No friction rub. No gallop.  Pulmonary:     Effort: Pulmonary effort is normal. No respiratory distress.     Breath sounds: Normal breath sounds. No wheezing or rales.  Chest:     Chest wall: No tenderness.  Abdominal:     General: Bowel sounds are normal. There is no distension.     Palpations: Abdomen is soft. There is no mass.     Tenderness: There is no abdominal tenderness. There is no guarding or rebound.  Musculoskeletal:        General: Tenderness present. Normal range of motion.     Cervical back: Normal range of motion.     Right lower leg: No edema.     Left lower leg: No edema.  Lymphadenopathy:     Cervical: No  cervical adenopathy.  Skin:    General: Skin is warm and dry.     Findings: No rash.  Neurological:     Mental Status: He is alert and oriented to person, place, and time.     Cranial Nerves: No cranial nerve deficit.     Motor: No weakness or abnormal muscle tone.     Coordination: Coordination normal.     Gait: Gait normal.     Deep Tendon Reflexes: Reflexes are normal and symmetric.  Psychiatric:        Behavior: Behavior normal.        Thought Content: Thought content normal.        Judgment: Judgment normal.   LS spine - stiff, pain w/ROM  Lab Results  Component Value Date   WBC 13.2 (H) 11/05/2023   HGB 9.3 (L) 11/05/2023   HCT 27.4 (L) 11/05/2023   PLT 121 (L) 11/05/2023   GLUCOSE 95 11/05/2023   CHOL 132 04/05/2023   TRIG 194.0 (H) 04/05/2023   HDL 38.20 (L) 04/05/2023   LDLDIRECT 101.0 01/10/2015   LDLCALC 55 04/05/2023   ALT 95 (H) 11/03/2023   AST 84 (H) 11/03/2023   NA 138 11/05/2023   K 4.1 11/05/2023   CL 111 11/05/2023   CREATININE 1.60 (H) 11/05/2023   BUN 30 (H) 11/05/2023   CO2 20 (L) 11/05/2023   TSH 3.24 10/07/2023   PSA 3.91 10/07/2023   HGBA1C 5.8 02/02/2019    DG CHEST PORT 1 VIEW Result Date: 10/31/2023 CLINICAL DATA:  Short of breath. EXAM: PORTABLE CHEST 1 VIEW COMPARISON:  08/08/2019 and prior studies.  CT, 08/08/2021. FINDINGS: Cardiac silhouette is normal in size. No mediastinal or hilar masses. Lungs demonstrate chronic interstitial prominence and areas of right mid and upper lung scarring. No evidence of pneumonia or pulmonary edema. No pleural effusion or pneumothorax. Skeletal structures are grossly intact. IMPRESSION: 1. No acute cardiopulmonary disease. Electronically Signed   By: Amie Portland M.D.   On: 10/31/2023 16:15   CT Renal Stone Study Result Date: 10/31/2023 CLINICAL DATA:  Told by urology that he had a kidney stone. Urosepsis. Recent cystoscopy. EXAM: CT ABDOMEN AND PELVIS WITHOUT CONTRAST TECHNIQUE: Multidetector CT imaging  of the abdomen and pelvis was performed following the standard protocol without IV contrast. RADIATION DOSE REDUCTION: This exam was performed according to the departmental dose-optimization program which includes automated exposure control, adjustment of the mA and/or kV according to patient size and/or use of iterative reconstruction technique. COMPARISON:  CT scan abdomen and pelvis from 10/14/2023. FINDINGS: Lower  chest: There are subpleural atelectatic changes in the visualized lung bases. No overt consolidation. No pleural effusion. The heart is normal in size. No pericardial effusion. Hepatobiliary: The liver is normal in size. Non-cirrhotic configuration. No suspicious mass. These is mild diffuse hepatic steatosis. No intrahepatic or extrahepatic bile duct dilation. Small volume calcified gallstones noted without imaging signs of acute cholecystitis. Normal gallbladder wall thickness. No pericholecystic inflammatory changes. Pancreas: Unremarkable. No pancreatic ductal dilatation or surrounding inflammatory changes. Spleen: Within normal limits. No focal lesion. Adrenals/Urinary Tract: Adrenal glands are unremarkable. No suspicious renal mass within the limitations of this unenhanced exam. Redemonstration of 7 x 13 mm left renal pelvic stone noted with significant associated urothelial thickening. There is mild fullness in the left renal collecting system without frank hydronephrosis. There is a stable 3 x 7 mm nonobstructing calculus in the left kidney lower pole calyx. No left ureterolithiasis. However, there is mild prominence of left ureter and mild left periureteric fat stranding, which is new since the prior study and may represent superimposed urinary tract infection/ureteritis. There is a 3 mm nonobstructing calculus in the right kidney lower pole calyx. No right ureterolithiasis. There is mild right periureteric fat stranding as well, favoring superimposed urinary tract infection. Mild right kidney  lower pole caliectasis. However, no frank right hydroureteronephrosis. Redemonstration of a probable mobile 8 x 10 mm calculus in the posterior portion of urinary bladder, which appears in different position when compared to the recent prior exam from 10/14/2023. Urinary bladder is otherwise unremarkable. No focal mass or perivesical fat stranding there is interval clearing of previously noted smaller bladder calculus. Stomach/Bowel: No disproportionate dilation of the small or large bowel loops. No evidence of abnormal bowel wall thickening or inflammatory changes. The appendix was not visualized; however there is no acute inflammatory process in the right lower quadrant. There are scattered diverticula mainly in the sigmoid colon, without imaging signs of diverticulitis. Vascular/Lymphatic: No ascites or pneumoperitoneum. No abdominal or pelvic lymphadenopathy, by size criteria. No aneurysmal dilation of the major abdominal arteries. There are moderate peripheral atherosclerotic vascular calcifications of the aorta and its major branches. Reproductive: Enlarged prostate. Symmetric seminal vesicles. Other: The visualized soft tissues and abdominal wall are unremarkable. Musculoskeletal: No suspicious osseous lesions. There are mild - moderate multilevel degenerative changes in the visualized spine. IMPRESSION: 1. Redemonstration of left renal pelvic calculus (7 x 13 mm) with associated urothelial thickening. There is mild fullness in the left renal collecting system without frank hydronephrosis. There is focal caliectasis in the right kidney lower pole. Mild prominence of bilateral ureters and mild periureteric fat stranding, which is new since the prior study and may represent superimposed urinary tract infection/ureteritis. Correlate clinically and with urinalysis. 2. There is a 8 x 10 mm calculus in the urinary bladder. Previously seen other smaller calculus has resolved in the interim. 3. Multiple other  nonacute observations (such as cholelithiasis without acute cholecystitis, colonic diverticula, prostatomegaly, etc.), As described above. 4. Aortic atherosclerosis. Aortic Atherosclerosis (ICD10-I70.0). Electronically Signed   By: Jules Schick M.D.   On: 10/31/2023 11:08    Assessment & Plan:   Problem List Items Addressed This Visit     Essential hypertension   BP Readings from Last 3 Encounters:  11/22/23 (!) 172/88  11/05/23 (!) 150/70  10/07/23 134/60         Dyslipidemia   Well adult exam   Bronchiectasis without acute exacerbation (HCC)   On Albuterol MDI      Pancytopenia (HCC)  Re-check CBC      Nephrolithiasis   CLINICAL DATA:  Told by urology that he had a kidney stone. Urosepsis. Recent cystoscopy.   Abd CT EXAM: CT ABDOMEN AND PELVIS WITHOUT CONTRAST   TECHNIQUE: Multidetector CT imaging of the abdomen and pelvis was performed following the standard protocol without IV contrast.   RADIATION DOSE REDUCTION: This exam was performed according to the departmental dose-optimization program which includes automated exposure control, adjustment of the mA and/or kV according to patient size and/or use of iterative reconstruction technique.   COMPARISON:  CT scan abdomen and pelvis from 10/14/2023.   FINDINGS: Lower chest: There are subpleural atelectatic changes in the visualized lung bases. No overt consolidation. No pleural effusion. The heart is normal in size. No pericardial effusion.   Hepatobiliary: The liver is normal in size. Non-cirrhotic configuration. No suspicious mass. These is mild diffuse hepatic steatosis. No intrahepatic or extrahepatic bile duct dilation. Small volume calcified gallstones noted without imaging signs of acute cholecystitis. Normal gallbladder wall thickness. No pericholecystic inflammatory changes.   Pancreas: Unremarkable. No pancreatic ductal dilatation or surrounding inflammatory changes.   Spleen: Within normal  limits. No focal lesion.   Adrenals/Urinary Tract: Adrenal glands are unremarkable. No suspicious renal mass within the limitations of this unenhanced exam. Redemonstration of 7 x 13 mm left renal pelvic stone noted with significant associated urothelial thickening. There is mild fullness in the left renal collecting system without frank hydronephrosis. There is a stable 3 x 7 mm nonobstructing calculus in the left kidney lower pole calyx. No left ureterolithiasis. However, there is mild prominence of left ureter and mild left periureteric fat stranding, which is new since the prior study and may represent superimposed urinary tract infection/ureteritis.   There is a 3 mm nonobstructing calculus in the right kidney lower pole calyx. No right ureterolithiasis. There is mild right periureteric fat stranding as well, favoring superimposed urinary tract infection. Mild right kidney lower pole caliectasis. However, no frank right hydroureteronephrosis. Redemonstration of a probable mobile 8 x 10 mm calculus in the posterior portion of urinary bladder, which appears in different position when compared to the recent prior exam from 10/14/2023. Urinary bladder is otherwise unremarkable. No focal mass or perivesical fat stranding there is interval clearing of previously noted smaller bladder calculus.   Stomach/Bowel: No disproportionate dilation of the small or large bowel loops. No evidence of abnormal bowel wall thickening or inflammatory changes. The appendix was not visualized; however there is no acute inflammatory process in the right lower quadrant. There are scattered diverticula mainly in the sigmoid colon, without imaging signs of diverticulitis.   Vascular/Lymphatic: No ascites or pneumoperitoneum. No abdominal or pelvic lymphadenopathy, by size criteria. No aneurysmal dilation of the major abdominal arteries. There are moderate peripheral atherosclerotic vascular calcifications  of the aorta and its major branches.   Reproductive: Enlarged prostate. Symmetric seminal vesicles.   Other: The visualized soft tissues and abdominal wall are unremarkable.   Musculoskeletal: No suspicious osseous lesions. There are mild - moderate multilevel degenerative changes in the visualized spine.   IMPRESSION: 1. Redemonstration of left renal pelvic calculus (7 x 13 mm) with associated urothelial thickening. There is mild fullness in the left renal collecting system without frank hydronephrosis. There is focal caliectasis in the right kidney lower pole. Mild prominence of bilateral ureters and mild periureteric fat stranding, which is new since the prior study and may represent superimposed urinary tract infection/ureteritis. Correlate clinically and with urinalysis. 2. There is a 8  x 10 mm calculus in the urinary bladder. Previously seen other smaller calculus has resolved in the interim. 3. Multiple other nonacute observations (such as cholelithiasis without acute cholecystitis, colonic diverticula, prostatomegaly, etc.), As described above. 4. Aortic atherosclerosis.   Amoxicillin po - Rx on hand F/u w/Dr Jennette Bill      Generalized weakness   Better (History of recent cystoscopy and Sepsis due to UTI - 10/2023. S/p IV abx. Urine cultures growing E. coli, Enterococcus therefore switched to ampicillin) Check CMET, CBC       Relevant Orders   Comprehensive metabolic panel with GFR   CBC with Differential/Platelet   Urinalysis   Elevated liver function tests   Recent sepsis due to UTI - 10/2023. S/p IV abx. Urine cultures growing E. coli, Enterococcus therefore switched to ampicillin.  Repeat CMET      UTI (urinary tract infection) - Primary   History of recent cystoscopy prior. Sepsis due to UTI - 10/2023. S/p IV abx. Urine cultures growing E. coli, Enterococcus therefore switched to ampicillin.    Narrative & Impression  CLINICAL DATA:  Told by urology that he  had a kidney stone. Urosepsis. Recent cystoscopy.   Abd CT EXAM: CT ABDOMEN AND PELVIS WITHOUT CONTRAST   TECHNIQUE: Multidetector CT imaging of the abdomen and pelvis was performed following the standard protocol without IV contrast.   RADIATION DOSE REDUCTION: This exam was performed according to the departmental dose-optimization program which includes automated exposure control, adjustment of the mA and/or kV according to patient size and/or use of iterative reconstruction technique.   COMPARISON:  CT scan abdomen and pelvis from 10/14/2023.   FINDINGS: Lower chest: There are subpleural atelectatic changes in the visualized lung bases. No overt consolidation. No pleural effusion. The heart is normal in size. No pericardial effusion.   Hepatobiliary: The liver is normal in size. Non-cirrhotic configuration. No suspicious mass. These is mild diffuse hepatic steatosis. No intrahepatic or extrahepatic bile duct dilation. Small volume calcified gallstones noted without imaging signs of acute cholecystitis. Normal gallbladder wall thickness. No pericholecystic inflammatory changes.   Pancreas: Unremarkable. No pancreatic ductal dilatation or surrounding inflammatory changes.   Spleen: Within normal limits. No focal lesion.   Adrenals/Urinary Tract: Adrenal glands are unremarkable. No suspicious renal mass within the limitations of this unenhanced exam. Redemonstration of 7 x 13 mm left renal pelvic stone noted with significant associated urothelial thickening. There is mild fullness in the left renal collecting system without frank hydronephrosis. There is a stable 3 x 7 mm nonobstructing calculus in the left kidney lower pole calyx. No left ureterolithiasis. However, there is mild prominence of left ureter and mild left periureteric fat stranding, which is new since the prior study and may represent superimposed urinary tract infection/ureteritis.   There is a 3 mm  nonobstructing calculus in the right kidney lower pole calyx. No right ureterolithiasis. There is mild right periureteric fat stranding as well, favoring superimposed urinary tract infection. Mild right kidney lower pole caliectasis. However, no frank right hydroureteronephrosis. Redemonstration of a probable mobile 8 x 10 mm calculus in the posterior portion of urinary bladder, which appears in different position when compared to the recent prior exam from 10/14/2023. Urinary bladder is otherwise unremarkable. No focal mass or perivesical fat stranding there is interval clearing of previously noted smaller bladder calculus.   Stomach/Bowel: No disproportionate dilation of the small or large bowel loops. No evidence of abnormal bowel wall thickening or inflammatory changes. The appendix was not visualized; however there  is no acute inflammatory process in the right lower quadrant. There are scattered diverticula mainly in the sigmoid colon, without imaging signs of diverticulitis.   Vascular/Lymphatic: No ascites or pneumoperitoneum. No abdominal or pelvic lymphadenopathy, by size criteria. No aneurysmal dilation of the major abdominal arteries. There are moderate peripheral atherosclerotic vascular calcifications of the aorta and its major branches.   Reproductive: Enlarged prostate. Symmetric seminal vesicles.   Other: The visualized soft tissues and abdominal wall are unremarkable.   Musculoskeletal: No suspicious osseous lesions. There are mild - moderate multilevel degenerative changes in the visualized spine.   IMPRESSION: 1. Redemonstration of left renal pelvic calculus (7 x 13 mm) with associated urothelial thickening. There is mild fullness in the left renal collecting system without frank hydronephrosis. There is focal caliectasis in the right kidney lower pole. Mild prominence of bilateral ureters and mild periureteric fat stranding, which is new since the prior study  and may represent superimposed urinary tract infection/ureteritis. Correlate clinically and with urinalysis. 2. There is a 8 x 10 mm calculus in the urinary bladder. Previously seen other smaller calculus has resolved in the interim. 3. Multiple other nonacute observations (such as cholelithiasis without acute cholecystitis, colonic diverticula, prostatomegaly, etc.), As described above. 4. Aortic atherosclerosis.   Amoxicillin po - Rx on hand prn UTI F/u w/Dr Jennette Bill        Relevant Orders   Comprehensive metabolic panel with GFR   CBC with Differential/Platelet   Urinalysis      Meds ordered this encounter  Medications   amoxicillin (AMOXIL) 500 MG capsule    Sig: Take 2 capsules (1,000 mg total) by mouth 2 (two) times daily.    Dispense:  40 capsule    Refill:  1      Follow-up: Return in about 6 weeks (around 01/03/2024) for a follow-up visit.  Sonda Primes, MD

## 2023-11-22 NOTE — Assessment & Plan Note (Signed)
 BP Readings from Last 3 Encounters:  11/22/23 (!) 172/88  11/05/23 (!) 150/70  10/07/23 134/60

## 2023-11-22 NOTE — Assessment & Plan Note (Signed)
 Recent sepsis due to UTI - 10/2023. S/p IV abx. Urine cultures growing E. coli, Enterococcus therefore switched to ampicillin.  Repeat CMET

## 2023-11-22 NOTE — Assessment & Plan Note (Signed)
 Better (History of recent cystoscopy and Sepsis due to UTI - 10/2023. S/p IV abx. Urine cultures growing E. coli, Enterococcus therefore switched to ampicillin) Check CMET, CBC

## 2023-11-22 NOTE — Assessment & Plan Note (Signed)
 Recheck CBC.

## 2023-11-22 NOTE — Assessment & Plan Note (Signed)
 CLINICAL DATA:  Told by urology that he had a kidney stone. Urosepsis. Recent cystoscopy.   Abd CT EXAM: CT ABDOMEN AND PELVIS WITHOUT CONTRAST   TECHNIQUE: Multidetector CT imaging of the abdomen and pelvis was performed following the standard protocol without IV contrast.   RADIATION DOSE REDUCTION: This exam was performed according to the departmental dose-optimization program which includes automated exposure control, adjustment of the mA and/or kV according to patient size and/or use of iterative reconstruction technique.   COMPARISON:  CT scan abdomen and pelvis from 10/14/2023.   FINDINGS: Lower chest: There are subpleural atelectatic changes in the visualized lung bases. No overt consolidation. No pleural effusion. The heart is normal in size. No pericardial effusion.   Hepatobiliary: The liver is normal in size. Non-cirrhotic configuration. No suspicious mass. These is mild diffuse hepatic steatosis. No intrahepatic or extrahepatic bile duct dilation. Small volume calcified gallstones noted without imaging signs of acute cholecystitis. Normal gallbladder wall thickness. No pericholecystic inflammatory changes.   Pancreas: Unremarkable. No pancreatic ductal dilatation or surrounding inflammatory changes.   Spleen: Within normal limits. No focal lesion.   Adrenals/Urinary Tract: Adrenal glands are unremarkable. No suspicious renal mass within the limitations of this unenhanced exam. Redemonstration of 7 x 13 mm left renal pelvic stone noted with significant associated urothelial thickening. There is mild fullness in the left renal collecting system without frank hydronephrosis. There is a stable 3 x 7 mm nonobstructing calculus in the left kidney lower pole calyx. No left ureterolithiasis. However, there is mild prominence of left ureter and mild left periureteric fat stranding, which is new since the prior study and may represent superimposed urinary tract  infection/ureteritis.   There is a 3 mm nonobstructing calculus in the right kidney lower pole calyx. No right ureterolithiasis. There is mild right periureteric fat stranding as well, favoring superimposed urinary tract infection. Mild right kidney lower pole caliectasis. However, no frank right hydroureteronephrosis. Redemonstration of a probable mobile 8 x 10 mm calculus in the posterior portion of urinary bladder, which appears in different position when compared to the recent prior exam from 10/14/2023. Urinary bladder is otherwise unremarkable. No focal mass or perivesical fat stranding there is interval clearing of previously noted smaller bladder calculus.   Stomach/Bowel: No disproportionate dilation of the small or large bowel loops. No evidence of abnormal bowel wall thickening or inflammatory changes. The appendix was not visualized; however there is no acute inflammatory process in the right lower quadrant. There are scattered diverticula mainly in the sigmoid colon, without imaging signs of diverticulitis.   Vascular/Lymphatic: No ascites or pneumoperitoneum. No abdominal or pelvic lymphadenopathy, by size criteria. No aneurysmal dilation of the major abdominal arteries. There are moderate peripheral atherosclerotic vascular calcifications of the aorta and its major branches.   Reproductive: Enlarged prostate. Symmetric seminal vesicles.   Other: The visualized soft tissues and abdominal wall are unremarkable.   Musculoskeletal: No suspicious osseous lesions. There are mild - moderate multilevel degenerative changes in the visualized spine.   IMPRESSION: 1. Redemonstration of left renal pelvic calculus (7 x 13 mm) with associated urothelial thickening. There is mild fullness in the left renal collecting system without frank hydronephrosis. There is focal caliectasis in the right kidney lower pole. Mild prominence of bilateral ureters and mild periureteric fat  stranding, which is new since the prior study and may represent superimposed urinary tract infection/ureteritis. Correlate clinically and with urinalysis. 2. There is a 8 x 10 mm calculus in the urinary bladder. Previously seen  other smaller calculus has resolved in the interim. 3. Multiple other nonacute observations (such as cholelithiasis without acute cholecystitis, colonic diverticula, prostatomegaly, etc.), As described above. 4. Aortic atherosclerosis.   Amoxicillin po - Rx on hand F/u w/Dr Jennette Bill

## 2023-11-22 NOTE — Assessment & Plan Note (Addendum)
 History of recent cystoscopy prior. Sepsis due to UTI - 10/2023. S/p IV abx. Urine cultures growing E. coli, Enterococcus therefore switched to ampicillin.    Narrative & Impression  CLINICAL DATA:  Told by urology that he had a kidney stone. Urosepsis. Recent cystoscopy.   Abd CT EXAM: CT ABDOMEN AND PELVIS WITHOUT CONTRAST   TECHNIQUE: Multidetector CT imaging of the abdomen and pelvis was performed following the standard protocol without IV contrast.   RADIATION DOSE REDUCTION: This exam was performed according to the departmental dose-optimization program which includes automated exposure control, adjustment of the mA and/or kV according to patient size and/or use of iterative reconstruction technique.   COMPARISON:  CT scan abdomen and pelvis from 10/14/2023.   FINDINGS: Lower chest: There are subpleural atelectatic changes in the visualized lung bases. No overt consolidation. No pleural effusion. The heart is normal in size. No pericardial effusion.   Hepatobiliary: The liver is normal in size. Non-cirrhotic configuration. No suspicious mass. These is mild diffuse hepatic steatosis. No intrahepatic or extrahepatic bile duct dilation. Small volume calcified gallstones noted without imaging signs of acute cholecystitis. Normal gallbladder wall thickness. No pericholecystic inflammatory changes.   Pancreas: Unremarkable. No pancreatic ductal dilatation or surrounding inflammatory changes.   Spleen: Within normal limits. No focal lesion.   Adrenals/Urinary Tract: Adrenal glands are unremarkable. No suspicious renal mass within the limitations of this unenhanced exam. Redemonstration of 7 x 13 mm left renal pelvic stone noted with significant associated urothelial thickening. There is mild fullness in the left renal collecting system without frank hydronephrosis. There is a stable 3 x 7 mm nonobstructing calculus in the left kidney lower pole calyx. No left  ureterolithiasis. However, there is mild prominence of left ureter and mild left periureteric fat stranding, which is new since the prior study and may represent superimposed urinary tract infection/ureteritis.   There is a 3 mm nonobstructing calculus in the right kidney lower pole calyx. No right ureterolithiasis. There is mild right periureteric fat stranding as well, favoring superimposed urinary tract infection. Mild right kidney lower pole caliectasis. However, no frank right hydroureteronephrosis. Redemonstration of a probable mobile 8 x 10 mm calculus in the posterior portion of urinary bladder, which appears in different position when compared to the recent prior exam from 10/14/2023. Urinary bladder is otherwise unremarkable. No focal mass or perivesical fat stranding there is interval clearing of previously noted smaller bladder calculus.   Stomach/Bowel: No disproportionate dilation of the small or large bowel loops. No evidence of abnormal bowel wall thickening or inflammatory changes. The appendix was not visualized; however there is no acute inflammatory process in the right lower quadrant. There are scattered diverticula mainly in the sigmoid colon, without imaging signs of diverticulitis.   Vascular/Lymphatic: No ascites or pneumoperitoneum. No abdominal or pelvic lymphadenopathy, by size criteria. No aneurysmal dilation of the major abdominal arteries. There are moderate peripheral atherosclerotic vascular calcifications of the aorta and its major branches.   Reproductive: Enlarged prostate. Symmetric seminal vesicles.   Other: The visualized soft tissues and abdominal wall are unremarkable.   Musculoskeletal: No suspicious osseous lesions. There are mild - moderate multilevel degenerative changes in the visualized spine.   IMPRESSION: 1. Redemonstration of left renal pelvic calculus (7 x 13 mm) with associated urothelial thickening. There is mild fullness in  the left renal collecting system without frank hydronephrosis. There is focal caliectasis in the right kidney lower pole. Mild prominence of bilateral ureters and mild periureteric fat stranding, which is new  since the prior study and may represent superimposed urinary tract infection/ureteritis. Correlate clinically and with urinalysis. 2. There is a 8 x 10 mm calculus in the urinary bladder. Previously seen other smaller calculus has resolved in the interim. 3. Multiple other nonacute observations (such as cholelithiasis without acute cholecystitis, colonic diverticula, prostatomegaly, etc.), As described above. 4. Aortic atherosclerosis.   Amoxicillin po - Rx on hand prn UTI F/u w/Dr Jennette Bill

## 2023-11-22 NOTE — Assessment & Plan Note (Signed)
 On Albuterol MDI

## 2023-11-24 ENCOUNTER — Other Ambulatory Visit: Payer: Self-pay | Admitting: *Deleted

## 2023-11-24 NOTE — Patient Instructions (Signed)
 Visit Information  Thank you for taking time to visit with me today. Please don't hesitate to contact me if I can be of assistance to you before our next scheduled telephone appointment.  Our next appointment is by telephone on Wednesday December 01, 2023 at 10:45 am  Please call the care guide team at [phone] if you need to cancel or reschedule your appointment.   Following are the goals we discussed today:  Patient Goals/Self-Care Activities: Participate in Transition of Care Program/Attend TOC scheduled calls Take all medications as prescribed Attend all scheduled provider appointments Call provider office for new concerns or questions  Continue pacing activity as your recuperation from recent surgery continues Use assistive devices as needed to prevent falls If you believe your condition is getting worse- contact your care providers (doctors) promptly- reaching out to your doctor early when you have concerns can prevent you from having to go to the hospital  If you are experiencing a Mental Health or Behavioral Health Crisis or need someone to talk to, please  call the Suicide and Crisis Lifeline: 988 call the [place] National Suicide Prevention Lifeline: (720) 827-5570 or TTY: 973-216-7045 TTY (330)822-6954) to talk to a trained counselor call 1-800-273-TALK (toll free, 24 hour hotline) go to [facility] [address], [place] [phone]) call the [facility] Crisis Line: (928)[phone] call 911   Patient verbalizes understanding of instructions and care plan provided today and agrees to view in MyChart. Active MyChart status and patient understanding of how to access instructions and care plan via MyChart confirmed with patient.     Caryl Pina, RN, BSN, Media planner  Transitions of Care  [facility] - [facility] [phone]: direct office

## 2023-11-24 NOTE — Patient Outreach (Addendum)
 Transition of Care week 3/ day # 16  Visit Note  11/24/2023  Name: Curtis Jacobs MRN: [id number]          DOB: [birth year]  Situation: Patient enrolled in [facility] 30-day program. Visit completed with patient by telephone.   HIPAA identifiers x 2 verified   Background:   Hospitalization March 16-21, 2025- sepsis secondary to UTI/ pyelonephritis  Initial Transition Care Management Follow-up Telephone Call    Past Medical History:  Diagnosis Date   BPH (benign prostatic hypertrophy)    Dr. Isabel Caprice   CAD (coronary artery disease)    HA (headache)    Traction- Dr. Anne Hahn- had MRI.MRA   HTN (hypertension)    Hyperlipidemia     Assessment:  "I am doing great, no problems.  Had a good visit with Dr. Posey Rea; taking the probiotics like he told me to; have the antibiotics on hand in case I develop signs of another UTI; feeling so much better, I am using my walker much less and moving around without any problems.  I am feeling stronger;"    Denies clinical concerns and sounds to be in no distress throughout [facility] 30-day program outreach call today  Patient Reported Symptoms: no clinical concerns reported today  Cognitive @FLOW ([id number]::1))@  Neurological No symptoms reported, Not assessed (not indicated)    HEENT No symptoms reported, Not assessed (not indicated)    Cardiovascular No symptoms reported, Not assessed (not indicated)    Respiratory Shortness of breath    Endocrine No symptoms reported, Not assessed (not indicated)    Gastrointestinal No symptoms reported, Not assessed (not indicated)    Genitourinary Other Recent UTI  Integumentary No symptoms reported, Not assessed (not indicated)    Musculoskeletal No symptoms reported    Psychosocial No symptoms reported, Not assessed (not indicated)     There were no vitals filed for this visit.  Medications Reviewed Today     Reviewed by Michaela Corner, RN (Registered Nurse) on 11/24/23 at 1120  Med List Status: <None>    Medication Order Taking? Sig Documenting Provider Last Dose Status Informant  albuterol (VENTOLIN HFA) 108 (90 Base) MCG/ACT inhaler [id number] Yes Inhale 2 puffs into the lungs every 6 (six) hours as needed for wheezing or shortness of breath. Miguel Rota, MD Taking Active   amLODipine (NORVASC) 2.5 MG tablet [id number]  Take 1 tablet (2.5 mg total) by mouth daily.  Patient not taking: Reported on 10/31/2023   Ronney Asters, NP  Expired 10/03/23 2359   amoxicillin (AMOXIL) 500 MG capsule [id number]  Take 2 capsules (1,000 mg total) by mouth 2 (two) times daily. Plotnikov, [name], MD  Active            Med Note Michaela Corner   Wed Nov 24, 2023 11:20 AM) 11/24/23: Reports during TOC call this was prescribed by PCP on 11/22/23--- to use PRN signs/ symptoms UTI redevelop-- this was confirmed by review of EHR- patient confirms he obtained and has on hand for prn use  aspirin EC 81 MG tablet [id number]  Take 1 tablet (81 mg total) by mouth daily. Swallow whole. Lewayne Bunting, MD  Active Self, Pharmacy Records  cetirizine (ZYRTEC) 10 MG tablet [id number]  Take 1 tablet by mouth daily. [provider]  Active Self, Pharmacy Records  ferrous sulfate 324 MG TBEC [id number]  Take 324 mg by mouth. [employer] [provider]  Active Self, Pharmacy Records  finasteride (PROSCAR) 5 MG tablet [id number]  Take  5 mg by mouth daily. [provider]  Active Self, Pharmacy Records           Med Note Lia Hopping, Marylen Ponto   Sun Oct 31, 2023 11:38 AM) This was just started recently  fluocinonide cream (LIDEX) 0.05 % [id number]  Apply 1 application topically daily. [provider]  Active Self, Pharmacy Records           Med Note Caro Laroche Jul 05, 2023  3:55 PM) Pt takes as needed  guaiFENesin (MUCINEX) 600 MG 12 hr tablet [id number]  Take 1 tablet (600 mg total) by mouth 2 (two) times daily. Miguel Rota, MD  Active   Multiple Vitamin (MULTIVITAMIN) tablet  [id number]  Take 1 tablet by mouth daily. [provider]  Active Self, Pharmacy Records  pantoprazole (PROTONIX) 40 MG tablet [id number]  Take 1 tablet (40 mg total) by mouth daily before breakfast. Nelson Chimes, Ankit C, MD  Active   rosuvastatin (CRESTOR) 20 MG tablet [id number]  TAKE 1 TABLET(20 MG) BY MOUTH DAILY Crenshaw, Madolyn Frieze, MD  Active Self, Pharmacy Records  terazosin (HYTRIN) 10 MG capsule [id number]  TAKE 1 CAPSULE BY MOUTH EVERY NIGHT AT BEDTIME. ANNUAL APPT DUE IN [place]. Plotnikov, [name], MD  Active Self, Pharmacy Records           Recommendation:   PCP Follow-up- as scheduled for 01/03/24  Follow Up Plan:   Telephone follow-up in 1 week  Plan for next week's call: Review medication adherence- confirm still taking probiotics/ ? Need for prn antibiotics? Reinforce/ provide education re: signs/ symptoms UTI along with action plan  Total time spent from review to signing of note/ including any care coordination interventions:  47 minutes  Pls call/ message for questions,  Caryl Pina, RN, BSN, CCRN Alumnus RN Care Manager  Transitions of Care  [facility] - [facility] [phone]: direct office

## 2023-12-01 ENCOUNTER — Other Ambulatory Visit: Payer: Self-pay | Admitting: *Deleted

## 2023-12-01 NOTE — Patient Instructions (Signed)
 Visit Information  Thank you for taking time to visit with me today. Please don't hesitate to contact me if I can be of assistance to you before our next scheduled telephone appointment.  Our next appointment is by telephone on Wednesday 12/08/23 at 11:30 am  Please call the care guide team at [phone] if you need to cancel or reschedule your appointment.   Following are the goals we discussed today:  Patient Goals/Self-Care Activities: Participate in Transition of Care Program/Attend TOC scheduled calls Take all medications as prescribed Attend all scheduled provider appointments Call provider office for new concerns or questions  Continue pacing activity as your recuperation continues Please make an appointment with your PCP to address your newly reported episodes of lightheadedness Please continue to monitor and write down on paper your blood pressures at home: please take these blood pressure readings to your doctor visit when you schedule it Use assistive devices as needed to prevent falls If you believe your condition is getting worse- contact your care providers (doctors) promptly- reaching out to your doctor early when you have concerns can prevent you from having to go to the hospital  If you are experiencing a Mental Health or Behavioral Health Crisis or need someone to talk to, please  call the Suicide and Crisis Lifeline: 988 call the [place]  National Suicide Prevention Lifeline: 432-550-8359 or TTY: 262-136-0398 TTY 406-450-3208) to talk to a trained counselor call 1-800-273-TALK (toll free, 24 hour hotline) go to [facility] [address] [place] [phone]) call the [facility] Crisis Line: [phone] call 911   Patient verbalizes understanding of instructions and care plan provided today and agrees to view in MyChart. Active MyChart status and patient understanding of how to access instructions and care plan via  MyChart confirmed with patient.      Shemuel Harkleroad Mckinney Jackob Crookston, RN, BSN, Media planner  Transitions of Care  [facility] - [facility] [phone]: direct office

## 2023-12-01 NOTE — Transitions of Care (Post Inpatient/ED Visit) (Signed)
 Transition of Care week 4/ day # 23  Visit Note  12/01/2023  Name: Curtis Jacobs MRN: [id number]          DOB: [birth year]  Situation: Patient enrolled in [facility] 30-day program. Visit completed with patient by telephone.   HIPAA identifiers x 2 verified  Background:  Hospitalization March 16-21, 2025- sepsis secondary to UTI/ pyelonephritis    Initial Transition Care Management Follow-up Telephone Call    Past Medical History:  Diagnosis Date   BPH (benign prostatic hypertrophy)    Dr. Bosie Bye   CAD (coronary artery disease)    HA (headache)    Traction- Dr. Tilda Fogo- had MRI.MRA   HTN (hypertension)    Hyperlipidemia     Assessment:   "I am not doing as good as I was- I started having some lightheadedness this week, and also some nasal drainage from the left side of my nose.  I have a history of sinus problems, so I don't know what this is.  The lightheadedness happens several times a day, and goes away pretty quickly when I sit down and stay still for a few minutes.  My blood pressures are normal.  I have no signs/ symptoms of any UTI- that is fine.  I will do as you advise and see if I can get an appointment scheduled with my doctor; I have not had any falls and am not having to use my walker or cane so far;"    Denies other clinical concerns and sounds to be in no distress throughout TOC 30-day program outreach call today Advised to call PCP office to schedule urgent PCP visit to address newly reported symptoms Confirmed has continued taking antihistamine as prescribed; confirmed no recent falls, not using assistive devices; reviewed blood pressures from home with patient: no concerns identified from reported BP values  Patient Reported Symptoms: Cognitive Cognitive Status: Alert and oriented to person, place, and time, Insightful and able to interpret abstract concepts, Normal speech and language skills Cognitive/Intellectual Conditions Management [RPT]: None reported or  documented in medical history or problem list   Health Maintenance Behaviors: Healthy diet, Sleep adequate, Annual physical exam, Stress management  Neurological      HEENT HEENT Symptoms Reported: Nasal discharge (reports intermittent nasal drainage- states "it may be the pollen" and "I have a history of sinus problems") HEENT Conditions:  (reports history of "sinus problems" and "seasonal allergies" confirmed taking antihistamines as prescribed) HEENT Management Strategies: Medication therapy, Routine screening, Adequate rest  (reports history of "sinus problems" and "seasonal allergies" confirmed taking antihistamines as prescribed)  Cardiovascular Cardiovascular Symptoms Reported: Lightheadness (intermittent/ self-limiting with associated (L) nasal drainage: reports "usually only lasts a few minutes then goes away;" patient thinks it may be related to sinus problems/ seasonal allergies: advised to contact PCP for urgent visit to address symptoms) Does patient have uncontrolled Hypertension?: No Cardiovascular Conditions: Hypertension Cardiovascular Management Strategies: Medication therapy, Coping strategies, Adequate rest, Routine screening, Diet modification  Respiratory Respiratory Symptoms Reported: Dry cough (reports occasional dry cough at baseline) Other Respiratory Symptoms: (L) nasal drainage; denies sore throat; denies shortness of breath Respiratory Conditions: Cough, Shortness of breath (reports both at baseline)  Endocrine Patient reports the following symptoms related to hypoglycemia or hyperglycemia : No symptoms reported, Not assessed (not indicated) Is patient diabetic?: No    Gastrointestinal Gastrointestinal Symptoms Reported: No symptoms reported Additional Gastrointestinal Details: Denies concerns or symptoms      Genitourinary   Genitourinary Conditions: Urinary tract infection Genitourinary Management Strategies:  Medication therapy, Fluid modification, Adequate  rest  Integumentary Integumentary Symptoms Reported: No symptoms reported, Not assessed (not indicated)    Musculoskeletal Musculoskelatal Symptoms Reviewed: Weakness Additional Musculoskeletal Details: Reports increased feeling of generalized intermittent, self-limiting "weakness and lightheadedness" x one week- confirmed moinitoring blood pressures at home- "all normal;" not using assistive devices; denies recent falls; reports "goes away in just a few minutes when I sit down and rest" Musculoskeletal Conditions: Mobility limited, Unsteady gait Musculoskeletal Management Strategies: Medication therapy, Routine screening, Adequate rest Falls in the past year?: No Patient at Risk for Falls Due to: Other (Comment), Impaired mobility (lightheadedness) Fall risk Follow up: Education provided, Falls prevention discussed  Psychosocial Psychosocial Symptoms Reported: No symptoms reported, Not assessed (not indicated)         Vitals:   12/01/23 1109  BP: 137/73    Medications Reviewed Today     Reviewed by Michaela Corner, RN (Registered Nurse) on 12/01/23 at 1036  Med List Status: <None>   Medication Order Taking? Sig Documenting Provider Last Dose Status Informant  albuterol (VENTOLIN HFA) 108 (90 Base) MCG/ACT inhaler [id number]  Inhale 2 puffs into the lungs every 6 (six) hours as needed for wheezing or shortness of breath. Miguel Rota, MD  Active   amLODipine (NORVASC) 2.5 MG tablet [id number]  Take 1 tablet (2.5 mg total) by mouth daily.  Patient not taking: Reported on 10/31/2023   Ronney Asters, NP  Expired 10/03/23 2359   amoxicillin (AMOXIL) 500 MG capsule [id number]  Take 2 capsules (1,000 mg total) by mouth 2 (two) times daily. Plotnikov, [name], MD  Active            Med Note Michaela Corner   Wed Nov 24, 2023 11:20 AM) 11/24/23: Reports during TOC call this was prescribed by PCP on 11/22/23--- to use PRN signs/ symptoms UTI redevelop-- this was confirmed by review of EHR-  patient confirms he obtained and has on hand for prn use  aspirin EC 81 MG tablet [id number]  Take 1 tablet (81 mg total) by mouth daily. Swallow whole. Lewayne Bunting, MD  Active Self, Pharmacy Records  cetirizine (ZYRTEC) 10 MG tablet [id number]  Take 1 tablet by mouth daily. [provider]  Active Self, Pharmacy Records  ferrous sulfate 324 MG TBEC [id number]  Take 324 mg by mouth. Nature Made [provider]  Active Self, Pharmacy Records  finasteride (PROSCAR) 5 MG tablet [id number]  Take 5 mg by mouth daily. [provider]  Active Self, Pharmacy Records           Med Note Lia Hopping, Marylen Ponto   Sun Oct 31, 2023 11:38 AM) This was just started recently  fluocinonide cream (LIDEX) 0.05 % [id number]  Apply 1 application topically daily. [provider]  Active Self, Pharmacy Records           Med Note Caro Laroche Jul 05, 2023  3:55 PM) Pt takes as needed  guaiFENesin (MUCINEX) 600 MG 12 hr tablet [id number]  Take 1 tablet (600 mg total) by mouth 2 (two) times daily. Miguel Rota, MD  Active   Multiple Vitamin (MULTIVITAMIN) tablet [id number]  Take 1 tablet by mouth daily. [provider]  Active Self, Pharmacy Records  pantoprazole (PROTONIX) 40 MG tablet [id number]  Take 1 tablet (40 mg total) by mouth daily before breakfast. Amin, Ankit C, MD  Active   rosuvastatin (CRESTOR) 20 MG tablet [id number]  TAKE 1 TABLET(20 MG)  BY MOUTH DAILY Audery Blazing Deannie Fabian, MD  Active Self, Pharmacy Records  terazosin (HYTRIN) 10 MG capsule [id number]  TAKE 1 CAPSULE BY MOUTH EVERY NIGHT AT BEDTIME. ANNUAL APPT DUE IN [place]. Plotnikov, Aleksei V, MD  Active Self, Pharmacy Records           Recommendation:   PCP Follow-up- advised to schedule urgent PCP office visit to address newly reported symptoms of lightheadedness and nasal drainage Continue to follow established plan of care for [facility] 30-day program-- UTI management  Follow Up Plan:   Telephone  follow-up in 1 week- sooner if indicated: confirmed patient has my direct contact information  Total time spent from review to signing of note/ including any care coordination interventions:  64 minutes  Pls call/ message for questions,  Wilkin Lippy Mckinney Alandis Bluemel, RN, BSN, CCRN Alumnus RN Care Manager  Transitions of Care  [facility] - [facility] [phone]: direct office

## 2023-12-06 NOTE — Telephone Encounter (Signed)
 Copied from CRM [id number]. Topic: General - Other >> Dec 06, 2023 11:45 AM Howard Macho wrote: Reason for CRM: patient would like to be called regarding an infection that might be coming back. He would like to talk to the nurse

## 2023-12-08 ENCOUNTER — Other Ambulatory Visit: Payer: Self-pay | Admitting: *Deleted

## 2023-12-08 NOTE — Transitions of Care (Post Inpatient/ED Visit) (Signed)
 Transition of Care  week # 5/ day # 30  Visit Note  12/08/2023  Name: Curtis Jacobs MRN: [id number]          DOB: [birth year]  Situation: Patient enrolled in [facility] 30-day program. Visit completed with patient by telephone.   HIPAA identifiers x 2 verified  TOC 30--day outreach completed; patient has successfully met/ accomplished his established goals for TOC 30-day program without hospital readmission and declines offer for ongoing follow up with longitudinal RN CM   Background:  Hospitalization March 16-21, 2025- sepsis secondary to UTI/ pyelonephritis  Transition Care Management Follow-up Telephone Call    Past Medical History:  Diagnosis Date   BPH (benign prostatic hypertrophy)    Dr. Bosie Bye   CAD (coronary artery disease)    HA (headache)    Traction- Dr. Tilda Fogo- had MRI.MRA   HTN (hypertension)    Hyperlipidemia    Assessment:  "I am doing much better than I was last week- the lightheadedness is only occasional now- it pretty much went away; so I did not call the doctor to schedule as you had advised- it got better and I didn't think I needed to; not having to use the cane or the walker.  No signs of anything wrong with my urine- it is clear yellow and comes out just fine.  My blood pressures all look good- I am not at home to review them with you right now."    Denies other clinical concerns and sounds to be in no distress throughout [facility] 30-day program outreach call today  Patient Reported Symptoms: Cognitive Cognitive Status: Alert and oriented to person, place, and time, Insightful and able to interpret abstract concepts, Normal speech and language skills Cognitive/Intellectual Conditions Management [RPT]: None reported or documented in medical history or problem list      Neurological Neurological Review of Symptoms: Weakness Oher Neurological Symptoms/Conditions [RPT]: reports "better and less" lightheadedness since "last week;" reports "only happens occasionally now-  confirmed patient did not schedule PCP appointment as recommended: he states "it got better, so I didn't think I needed to go"    HEENT HEENT Symptoms Reported: No symptoms reported      Cardiovascular Cardiovascular Symptoms Reported: Lightheadness ("occasional" as per neuro assessment) Does patient have uncontrolled Hypertension?: No Cardiovascular Conditions: Hypertension Cardiovascular Management Strategies: Medication therapy, Routine screening, Coping strategies, Diet modification, Adequate rest  Respiratory Respiratory Symptoms Reported: No symptoms reported Other Respiratory Symptoms: Today, denies nasal drainage/ sore throat, shortness of breath/ coughing and sounds to be in distress throughout TOC call today Respiratory Conditions: Cough, Shortness of breath, Seasonal allergies  Endocrine Patient reports the following symptoms related to hypoglycemia or hyperglycemia : No symptoms reported Is patient diabetic?: No    Gastrointestinal Gastrointestinal Symptoms Reported: No symptoms reported Additional Gastrointestinal Details: Reports "normal and regular" bowel movements; denies other concerns      Genitourinary Genitourinary Symptoms Reported: No symptoms reported Other Genitourinary Symptoms: Reinforced signs symptoms UTI along withcorresponding action plan: patient verbalizes good understanding of same and deniea all today Genitourinary Conditions: Urinary tract infection Genitourinary Management Strategies: Medication therapy, Fluid modification, Adequate rest, Coping strategies  Integumentary Integumentary Symptoms Reported: No symptoms reported    Musculoskeletal Musculoskelatal Symptoms Reviewed: Weakness Additional Musculoskeletal Details: Reports previously reported "weakness" is now better; states "it just comes every now and then, mainly I am feeling fine; it is not so bad that I have to use a cane or a walker" Musculoskeletal Conditions: Other Other Musculoskeletal  Conditions: gerneralized, intermittent,  non-specific "weakness" Musculoskeletal Management Strategies: Routine screening, Medication therapy, Adequate rest      Psychosocial Psychosocial Symptoms Reported: No symptoms reported         There were no vitals filed for this visit.  Medications Reviewed Today     Reviewed by Brinley Rosete M, RN (Registered Nurse) on 12/08/23 at 1147  Med List Status: <None>   Medication Order Taking? Sig Documenting Provider Last Dose Status Informant  albuterol  (VENTOLIN  HFA) 108 (90 Base) MCG/ACT inhaler [id number] No Inhale 2 puffs into the lungs every 6 (six) hours as needed for wheezing or shortness of breath. Maggie Schooner, MD Taking Active   amLODipine  (NORVASC ) 2.5 MG tablet [id number] No Take 1 tablet (2.5 mg total) by mouth daily.  Patient not taking: Reported on 10/31/2023   Carie Charity, NP Not Taking Expired 10/03/23 2359   amoxicillin  (AMOXIL ) 500 MG capsule [id number]  Take 2 capsules (1,000 mg total) by mouth 2 (two) times daily. Plotnikov, Aleksei V, MD  Active            Med Note (Grant Henkes M   Wed Nov 24, 2023 11:20 AM) 11/24/23: Reports during TOC call this was prescribed by PCP on 11/22/23--- to use PRN signs/ symptoms UTI redevelop-- this was confirmed by review of EHR- patient confirms he obtained and has on hand for prn use  aspirin  EC 81 MG tablet [id number] No Take 1 tablet (81 mg total) by mouth daily. Swallow whole. Lenise Quince, MD Taking Active Self, Pharmacy Records  cetirizine (ZYRTEC) 10 MG tablet [id number] No Take 1 tablet by mouth daily. [provider] Taking Active Self, Pharmacy Records  ferrous sulfate  324 MG TBEC [id number] No Take 324 mg by mouth. Nature Made [provider] Taking Active Self, Pharmacy Records  finasteride  (PROSCAR ) 5 MG tablet [id number] No Take 5 mg by mouth daily. [provider] Taking Active Self, Pharmacy Records           Med Note Desiree Florence, Kolleen Perone   Sun Oct 31, 2023 11:38 AM) This was just started recently  fluocinonide cream (LIDEX) 0.05 % [id number] No Apply 1 application topically daily. [provider] Taking Active Self, Pharmacy Records           Med Note Sheryn Doom Jul 05, 2023  3:55 PM) Pt takes as needed  guaiFENesin  (MUCINEX ) 600 MG 12 hr tablet [id number] No Take 1 tablet (600 mg total) by mouth 2 (two) times daily. Maggie Schooner, MD Taking Active   Multiple Vitamin (MULTIVITAMIN) tablet [id number] No Take 1 tablet by mouth daily. [provider] Taking Active Self, Pharmacy Records  pantoprazole  (PROTONIX ) 40 MG tablet [id number] No Take 1 tablet (40 mg total) by mouth daily before breakfast. Maggie Schooner, MD Taking Expired 12/06/23 2359   rosuvastatin  (CRESTOR ) 20 MG tablet [id number] No TAKE 1 TABLET(20 MG) BY MOUTH DAILY Crenshaw, Deannie Fabian, MD Taking Active Self, Pharmacy Records  terazosin  (HYTRIN ) 10 MG capsule [id number] No TAKE 1 CAPSULE BY MOUTH EVERY NIGHT AT BEDTIME. ANNUAL APPT DUE IN [place]. Plotnikov, Aleksei V, MD Taking Active Self, Pharmacy Records           Recommendation:   PCP Follow-up- if symptoms of occasional/ intermittent lightheadedness persist  Follow Up Plan:   Closing From:  Transitions of Care Program  Total time spent from review to signing of note/ including any care coordination interventions:  41 minutes  Pls call/ message for questions,  Joanell Cressler Mckinney Sahith Nurse, RN, BSN, Media planner  Transitions of Care  [facility] - [facility] [phone]: direct office

## 2023-12-08 NOTE — Patient Instructions (Signed)
 Visit Information  Thank you for taking time to visit with me today. Please don't hesitate to contact me if I can be of assistance to you before our next scheduled telephone appointment.  It has been a pleasure working with you over the last 30 days!  Great job managing your care after your hospital visit!  I am glad that you are doing well!  Please do not hesitate to contact me in the future if I can be of assistance to you!  Following are the goals we discussed today:  Patient Goals/Self-Care Activities: Take all medications as prescribed Attend all scheduled provider appointments Call provider office for new concerns or questions  Continue pacing activity as your recuperation continues Please make an appointment with your PCP to address your recently reported episodes of lightheadedness Please continue to monitor and write down on paper your blood pressures at home: please take these blood pressure readings to your doctor visit when you schedule it Use assistive devices as needed to prevent falls If you believe your condition is getting worse- contact your care providers (doctors) promptly- reaching out to your doctor early when you have concerns can prevent you from having to go to the hospital  If you are experiencing a Mental Health or Behavioral Health Crisis or need someone to talk to, please call the Suicide and Crisis Lifeline: 988 call the [place]  National Suicide Prevention Lifeline: 6511410118 or TTY: 817 605 8654 TTY (707) 603-8337) to talk to a trained counselor call 1-800-273-TALK (toll free, 24 hour hotline) go to [facility] [address] [place] [phone]) call the [facility] Crisis Line: [phone] call 911   Patient verbalizes understanding of instructions and care plan provided today and agrees to view in MyChart. Active MyChart status and patient understanding of how to access instructions and care plan via MyChart  confirmed with patient.     Jakeob Tullis Mckinney Tienna Bienkowski, RN, BSN, Media planner  Transitions of Care  [facility] - [facility] [phone]: direct office

## 2023-12-09 NOTE — Telephone Encounter (Signed)
 Called patient in regard to this 1st attempt

## 2023-12-13 ENCOUNTER — Telehealth: Payer: Self-pay | Admitting: Internal Medicine

## 2023-12-13 ENCOUNTER — Other Ambulatory Visit: Payer: Self-pay | Admitting: Internal Medicine

## 2023-12-13 ENCOUNTER — Encounter: Payer: Self-pay | Admitting: Internal Medicine

## 2023-12-13 ENCOUNTER — Ambulatory Visit (INDEPENDENT_AMBULATORY_CARE_PROVIDER_SITE_OTHER): Admitting: Internal Medicine

## 2023-12-13 ENCOUNTER — Ambulatory Visit (INDEPENDENT_AMBULATORY_CARE_PROVIDER_SITE_OTHER)

## 2023-12-13 VITALS — BP 132/62 | HR 74 | Temp 97.7°F | Ht 70.0 in | Wt 194.8 lb

## 2023-12-13 DIAGNOSIS — J189 Pneumonia, unspecified organism: Secondary | ICD-10-CM | POA: Insufficient documentation

## 2023-12-13 DIAGNOSIS — R531 Weakness: Secondary | ICD-10-CM | POA: Diagnosis not present

## 2023-12-13 DIAGNOSIS — R0609 Other forms of dyspnea: Secondary | ICD-10-CM

## 2023-12-13 DIAGNOSIS — R911 Solitary pulmonary nodule: Secondary | ICD-10-CM | POA: Diagnosis not present

## 2023-12-13 DIAGNOSIS — I10 Essential (primary) hypertension: Secondary | ICD-10-CM | POA: Diagnosis not present

## 2023-12-13 DIAGNOSIS — N10 Acute tubulo-interstitial nephritis: Secondary | ICD-10-CM | POA: Diagnosis not present

## 2023-12-13 DIAGNOSIS — J479 Bronchiectasis, uncomplicated: Secondary | ICD-10-CM | POA: Diagnosis not present

## 2023-12-13 DIAGNOSIS — G8929 Other chronic pain: Secondary | ICD-10-CM | POA: Diagnosis not present

## 2023-12-13 DIAGNOSIS — M5442 Lumbago with sciatica, left side: Secondary | ICD-10-CM | POA: Diagnosis not present

## 2023-12-13 DIAGNOSIS — J984 Other disorders of lung: Secondary | ICD-10-CM | POA: Diagnosis not present

## 2023-12-13 DIAGNOSIS — M5441 Lumbago with sciatica, right side: Secondary | ICD-10-CM | POA: Diagnosis not present

## 2023-12-13 DIAGNOSIS — R918 Other nonspecific abnormal finding of lung field: Secondary | ICD-10-CM | POA: Diagnosis not present

## 2023-12-13 DIAGNOSIS — R059 Cough, unspecified: Secondary | ICD-10-CM | POA: Diagnosis not present

## 2023-12-13 LAB — URINALYSIS, ROUTINE W REFLEX MICROSCOPIC
Bilirubin Urine: NEGATIVE
Ketones, ur: NEGATIVE
Nitrite: NEGATIVE
Specific Gravity, Urine: 1.02 (ref 1.000–1.030)
Urine Glucose: NEGATIVE
Urobilinogen, UA: 0.2 (ref 0.0–1.0)
pH: 6 (ref 5.0–8.0)

## 2023-12-13 LAB — COMPREHENSIVE METABOLIC PANEL WITH GFR
ALT: 12 U/L (ref 0–53)
AST: 17 U/L (ref 0–37)
Albumin: 4 g/dL (ref 3.5–5.2)
Alkaline Phosphatase: 92 U/L (ref 39–117)
BUN: 17 mg/dL (ref 6–23)
CO2: 26 meq/L (ref 19–32)
Calcium: 9.6 mg/dL (ref 8.4–10.5)
Chloride: 108 meq/L (ref 96–112)
Creatinine, Ser: 1.47 mg/dL (ref 0.40–1.50)
GFR: 42.34 mL/min — ABNORMAL LOW (ref >60.00)
Glucose, Bld: 115 mg/dL — ABNORMAL HIGH (ref 70–99)
Potassium: 4.2 meq/L (ref 3.5–5.1)
Sodium: 141 meq/L (ref 135–145)
Total Bilirubin: 0.5 mg/dL (ref 0.2–1.2)
Total Protein: 7 g/dL (ref 6.0–8.3)

## 2023-12-13 LAB — CBC WITH DIFFERENTIAL/PLATELET
Basophils Absolute: 0 10*3/uL (ref 0.0–0.1)
Basophils Relative: 0.9 % (ref 0.0–3.0)
Eosinophils Absolute: 0.1 10*3/uL (ref 0.0–0.7)
Eosinophils Relative: 2.2 % (ref 0.0–5.0)
HCT: 32.6 % — ABNORMAL LOW (ref 39.0–52.0)
Hemoglobin: 10.9 g/dL — ABNORMAL LOW (ref 13.0–17.0)
Lymphocytes Relative: 30.7 % (ref 12.0–46.0)
Lymphs Abs: 1.4 10*3/uL (ref 0.7–4.0)
MCHC: 33.4 g/dL (ref 30.0–36.0)
MCV: 94.6 fl (ref 78.0–100.0)
Monocytes Absolute: 0.5 10*3/uL (ref 0.1–1.0)
Monocytes Relative: 11.3 % (ref 3.0–12.0)
Neutro Abs: 2.4 10*3/uL (ref 1.4–7.7)
Neutrophils Relative %: 54.9 % (ref 43.0–77.0)
Platelets: 85 10*3/uL — ABNORMAL LOW (ref 150.0–400.0)
RBC: 3.45 Mil/uL — ABNORMAL LOW (ref 4.22–5.81)
RDW: 15.7 % — ABNORMAL HIGH (ref 11.5–15.5)
WBC: 4.4 10*3/uL (ref 4.0–10.5)

## 2023-12-13 LAB — BRAIN NATRIURETIC PEPTIDE: Pro B Natriuretic peptide (BNP): 94 pg/mL (ref 0.0–100.0)

## 2023-12-13 LAB — TSH: TSH: 2.95 u[IU]/mL (ref 0.35–5.50)

## 2023-12-13 MED ORDER — CEFUROXIME AXETIL 500 MG PO TABS: 500.0000 mg | ORAL_TABLET | Freq: Two times a day (BID) | ORAL | 0 refills | Status: DC

## 2023-12-13 MED ORDER — ALBUTEROL SULFATE HFA 108 (90 BASE) MCG/ACT IN AERS: 2.0000 | INHALATION_SPRAY | Freq: Four times a day (QID) | RESPIRATORY_TRACT | 5 refills | Status: AC | PRN

## 2023-12-13 NOTE — Assessment & Plan Note (Signed)
 Dr Margery Sheets - injections for spinal stenosis

## 2023-12-13 NOTE — Assessment & Plan Note (Signed)
 New pneumonia in the right lung likely responsible for his symptoms Start Ceftin for 10 days

## 2023-12-13 NOTE — Assessment & Plan Note (Signed)
Rx for albuterol

## 2023-12-13 NOTE — Telephone Encounter (Signed)
 Patient was seen today regarding these issues.

## 2023-12-13 NOTE — Assessment & Plan Note (Signed)
Check UA today.  

## 2023-12-13 NOTE — Telephone Encounter (Signed)
 Called patient regarding this, 2nd attempt

## 2023-12-13 NOTE — Assessment & Plan Note (Signed)
 UA and other labs (CBC etc)

## 2023-12-13 NOTE — Assessment & Plan Note (Signed)
 BP Readings from Last 3 Encounters:  12/13/23 132/62  12/01/23 137/73  11/22/23 135/80

## 2023-12-13 NOTE — Telephone Encounter (Signed)
 Copied from CRM [id number]. Topic: Clinical - Lab/Test Results >> Dec 13, 2023 10:53 AM Oddis Bench wrote: Reason for CRM: Patient Tiffany calling from [facility] radiology calling to go over patient imaging that was sent.

## 2023-12-13 NOTE — Progress Notes (Signed)
 Subjective:  Patient ID: Curtis Jacobs, male    DOB: 09-17-1932  Age: 89 y.o. MRN: [id number]  CC: Fatigue (Patient notes fatigue, exhaustion upon exertion, urinary retention, feeling of syncope, brain fog for 2 weeks. Patient was given an emergency amoxicillin  script and has taken 4 tablets of this, notes the feeling of syncope had alleviated after this. Wants to make sure this isn't resulting from him having infection (patient had very similar symptoms in March which resulted in a hospital visit). //Inquire on ventolin  inhaler prescribed by hospital and nebulizer solution)   HPI [name] presents for Fatigue (Patient notes fatigue, exhaustion upon exertion, urinary retention, feeling of syncope, brain fog for 2 weeks. Patient was given an emergency amoxicillin  script and has taken 4 tablets of this, notes the feeling of syncope had alleviated after this. Wants to make sure this isn't resulting from him having infection (patient had very similar symptoms in March which resulted in a hospital visit). //Inquire on ventolin  inhaler prescribed by hospital and nebulizer solution). C/o fatigue, DOE  Outpatient Medications Prior to Visit  Medication Sig Dispense Refill   amoxicillin  (AMOXIL ) 500 MG capsule Take 2 capsules (1,000 mg total) by mouth 2 (two) times daily. 40 capsule 1   aspirin  EC 81 MG tablet Take 1 tablet (81 mg total) by mouth daily. Swallow whole. 90 tablet 3   cetirizine (ZYRTEC) 10 MG tablet Take 1 tablet by mouth daily.     ferrous sulfate  324 MG TBEC Take 324 mg by mouth. Nature Made     finasteride  (PROSCAR ) 5 MG tablet Take 5 mg by mouth daily.     fluocinonide cream (LIDEX) 0.05 % Apply 1 application topically daily.     guaiFENesin  (MUCINEX ) 600 MG 12 hr tablet Take 1 tablet (600 mg total) by mouth 2 (two) times daily. 60 tablet 0   Multiple Vitamin (MULTIVITAMIN) tablet Take 1 tablet by mouth daily.     rosuvastatin  (CRESTOR ) 20 MG tablet TAKE 1 TABLET(20 MG) BY MOUTH  DAILY 90 tablet 3   terazosin  (HYTRIN ) 10 MG capsule TAKE 1 CAPSULE BY MOUTH EVERY NIGHT AT BEDTIME. ANNUAL APPT DUE IN [place]. 90 capsule 3   albuterol  (VENTOLIN  HFA) 108 (90 Base) MCG/ACT inhaler Inhale 2 puffs into the lungs every 6 (six) hours as needed for wheezing or shortness of breath. 18 g 0   amLODipine  (NORVASC ) 2.5 MG tablet Take 1 tablet (2.5 mg total) by mouth daily. (Patient not taking: Reported on 10/31/2023) 90 tablet 3   pantoprazole  (PROTONIX ) 40 MG tablet Take 1 tablet (40 mg total) by mouth daily before breakfast. 30 tablet 0   No facility-administered medications prior to visit.    ROS: Review of Systems  Constitutional:  Positive for fatigue. Negative for appetite change and unexpected weight change.  HENT:  Negative for congestion, nosebleeds, sneezing, sore throat and trouble swallowing.   Eyes:  Negative for itching and visual disturbance.  Respiratory:  Negative for cough.   Cardiovascular:  Negative for chest pain, palpitations and leg swelling.  Gastrointestinal:  Negative for abdominal distention, blood in stool, diarrhea and nausea.  Genitourinary:  Negative for decreased urine volume, frequency and hematuria.  Musculoskeletal:  Negative for back pain, gait problem, joint swelling and neck pain.  Skin:  Negative for rash.  Neurological:  Positive for weakness. Negative for dizziness, tremors and speech difficulty.  Psychiatric/Behavioral:  Negative for agitation, dysphoric mood, sleep disturbance and suicidal ideas. The patient is not nervous/anxious.  Objective:  BP 132/62   Pulse 74   Temp 97.7 F (36.5 C)   Ht 5\' 10"  (1.778 m)   Wt 194 lb 12.8 oz (88.4 kg)   SpO2 93%   BMI 27.95 kg/m   BP Readings from Last 3 Encounters:  12/13/23 132/62  12/01/23 137/73  11/22/23 135/80    Wt Readings from Last 3 Encounters:  12/13/23 194 lb 12.8 oz (88.4 kg)  11/22/23 191 lb 12.8 oz (87 kg)  10/31/23 190 lb (86.2 kg)    Physical  Exam Constitutional:      General: He is not in acute distress.    Appearance: Normal appearance. He is well-developed.     Comments: NAD  Eyes:     Conjunctiva/sclera: Conjunctivae normal.     Pupils: Pupils are equal, round, and reactive to light.  Neck:     Thyroid : No thyromegaly.     Vascular: No JVD.  Cardiovascular:     Rate and Rhythm: Normal rate and regular rhythm.     Heart sounds: Normal heart sounds. No murmur heard.    No friction rub. No gallop.  Pulmonary:     Effort: Pulmonary effort is normal. No respiratory distress.     Breath sounds: Normal breath sounds. No wheezing or rales.  Chest:     Chest wall: No tenderness.  Abdominal:     General: Bowel sounds are normal. There is no distension.     Palpations: Abdomen is soft. There is no mass.     Tenderness: There is no abdominal tenderness. There is no guarding or rebound.  Musculoskeletal:        General: No tenderness. Normal range of motion.     Cervical back: Normal range of motion.  Lymphadenopathy:     Cervical: No cervical adenopathy.  Skin:    General: Skin is warm and dry.     Findings: No rash.  Neurological:     Mental Status: He is alert and oriented to person, place, and time.     Cranial Nerves: No cranial nerve deficit.     Motor: No abnormal muscle tone.     Coordination: Coordination normal.     Gait: Gait normal.     Deep Tendon Reflexes: Reflexes are normal and symmetric.  Psychiatric:        Behavior: Behavior normal.        Thought Content: Thought content normal.        Judgment: Judgment normal.     Lab Results  Component Value Date   WBC 4.4 11/22/2023   HGB 10.5 (L) 11/22/2023   HCT 31.6 (L) 11/22/2023   PLT 117.0 (L) 11/22/2023   GLUCOSE 122 (H) 11/22/2023   CHOL 129 11/22/2023   TRIG 137.0 11/22/2023   HDL 36.20 (L) 11/22/2023   LDLDIRECT 101.0 01/10/2015   LDLCALC 66 11/22/2023   ALT 16 11/22/2023   AST 16 11/22/2023   NA 142 11/22/2023   K 4.1 11/22/2023   CL  107 11/22/2023   CREATININE 1.37 11/22/2023   BUN 23 11/22/2023   CO2 27 11/22/2023   TSH 1.45 11/22/2023   PSA 17.10 (H) 11/22/2023   HGBA1C 5.8 02/02/2019    DG CHEST PORT 1 VIEW Result Date: 10/31/2023 CLINICAL DATA:  Short of breath. EXAM: PORTABLE CHEST 1 VIEW COMPARISON:  08/08/2019 and prior studies.  CT, 08/08/2021. FINDINGS: Cardiac silhouette is normal in size. No mediastinal or hilar masses. Lungs demonstrate chronic interstitial prominence and areas of right mid  and upper lung scarring. No evidence of pneumonia or pulmonary edema. No pleural effusion or pneumothorax. Skeletal structures are grossly intact. IMPRESSION: 1. No acute cardiopulmonary disease. Electronically Signed   By: Amanda Jungling M.D.   On: 10/31/2023 16:15   CT Renal Stone Study Result Date: 10/31/2023 CLINICAL DATA:  Told by urology that he had a kidney stone. Urosepsis. Recent cystoscopy. EXAM: CT ABDOMEN AND PELVIS WITHOUT CONTRAST TECHNIQUE: Multidetector CT imaging of the abdomen and pelvis was performed following the standard protocol without IV contrast. RADIATION DOSE REDUCTION: This exam was performed according to the departmental dose-optimization program which includes automated exposure control, adjustment of the mA and/or kV according to patient size and/or use of iterative reconstruction technique. COMPARISON:  CT scan abdomen and pelvis from 10/14/2023. FINDINGS: Lower chest: There are subpleural atelectatic changes in the visualized lung bases. No overt consolidation. No pleural effusion. The heart is normal in size. No pericardial effusion. Hepatobiliary: The liver is normal in size. Non-cirrhotic configuration. No suspicious mass. These is mild diffuse hepatic steatosis. No intrahepatic or extrahepatic bile duct dilation. Small volume calcified gallstones noted without imaging signs of acute cholecystitis. Normal gallbladder wall thickness. No pericholecystic inflammatory changes. Pancreas: Unremarkable. No  pancreatic ductal dilatation or surrounding inflammatory changes. Spleen: Within normal limits. No focal lesion. Adrenals/Urinary Tract: Adrenal glands are unremarkable. No suspicious renal mass within the limitations of this unenhanced exam. Redemonstration of 7 x 13 mm left renal pelvic stone noted with significant associated urothelial thickening. There is mild fullness in the left renal collecting system without frank hydronephrosis. There is a stable 3 x 7 mm nonobstructing calculus in the left kidney lower pole calyx. No left ureterolithiasis. However, there is mild prominence of left ureter and mild left periureteric fat stranding, which is new since the prior study and may represent superimposed urinary tract infection/ureteritis. There is a 3 mm nonobstructing calculus in the right kidney lower pole calyx. No right ureterolithiasis. There is mild right periureteric fat stranding as well, favoring superimposed urinary tract infection. Mild right kidney lower pole caliectasis. However, no frank right hydroureteronephrosis. Redemonstration of a probable mobile 8 x 10 mm calculus in the posterior portion of urinary bladder, which appears in different position when compared to the recent prior exam from 10/14/2023. Urinary bladder is otherwise unremarkable. No focal mass or perivesical fat stranding there is interval clearing of previously noted smaller bladder calculus. Stomach/Bowel: No disproportionate dilation of the small or large bowel loops. No evidence of abnormal bowel wall thickening or inflammatory changes. The appendix was not visualized; however there is no acute inflammatory process in the right lower quadrant. There are scattered diverticula mainly in the sigmoid colon, without imaging signs of diverticulitis. Vascular/Lymphatic: No ascites or pneumoperitoneum. No abdominal or pelvic lymphadenopathy, by size criteria. No aneurysmal dilation of the major abdominal arteries. There are moderate  peripheral atherosclerotic vascular calcifications of the aorta and its major branches. Reproductive: Enlarged prostate. Symmetric seminal vesicles. Other: The visualized soft tissues and abdominal wall are unremarkable. Musculoskeletal: No suspicious osseous lesions. There are mild - moderate multilevel degenerative changes in the visualized spine. IMPRESSION: 1. Redemonstration of left renal pelvic calculus (7 x 13 mm) with associated urothelial thickening. There is mild fullness in the left renal collecting system without frank hydronephrosis. There is focal caliectasis in the right kidney lower pole. Mild prominence of bilateral ureters and mild periureteric fat stranding, which is new since the prior study and may represent superimposed urinary tract infection/ureteritis. Correlate clinically and with  urinalysis. 2. There is a 8 x 10 mm calculus in the urinary bladder. Previously seen other smaller calculus has resolved in the interim. 3. Multiple other nonacute observations (such as cholelithiasis without acute cholecystitis, colonic diverticula, prostatomegaly, etc.), As described above. 4. Aortic atherosclerosis. Aortic Atherosclerosis (ICD10-I70.0). Electronically Signed   By: Beula Brunswick M.D.   On: 10/31/2023 11:08    Assessment & Plan:   Problem List Items Addressed This Visit     Essential hypertension   BP Readings from Last 3 Encounters:  12/13/23 132/62  12/01/23 137/73  11/22/23 135/80         Bronchiectasis without acute exacerbation (HCC)   Rx for albuterol       Relevant Orders   Comprehensive metabolic panel with GFR   CBC with Differential/Platelet   Low back pain   Dr Margery Sheets - injections for spinal stenosis      Weakness   UA and other labs (CBC etc)      Relevant Orders   Comprehensive metabolic panel with GFR   CBC with Differential/Platelet   TSH   Urinalysis   UTI (urinary tract infection)   Check UA today      DOE (dyspnea on exertion) - Primary    Rx for albuterol  Labs incl CBC R/o CHF - get a CXR      Relevant Orders   DG Chest 2 View   Brain natriuretic peptide      Meds ordered this encounter  Medications   albuterol  (VENTOLIN  HFA) 108 (90 Base) MCG/ACT inhaler    Sig: Inhale 2 puffs into the lungs every 6 (six) hours as needed for wheezing or shortness of breath.    Dispense:  8 g    Refill:  5      Follow-up: Return in about 2 weeks (around 12/27/2023) for a follow-up visit.  Anitra Barn, MD

## 2023-12-13 NOTE — Assessment & Plan Note (Signed)
 Rx for albuterol  Labs incl CBC R/o CHF - get a CXR

## 2023-12-21 DIAGNOSIS — D2272 Melanocytic nevi of left lower limb, including hip: Secondary | ICD-10-CM | POA: Diagnosis not present

## 2023-12-21 DIAGNOSIS — D692 Other nonthrombocytopenic purpura: Secondary | ICD-10-CM | POA: Diagnosis not present

## 2023-12-21 DIAGNOSIS — L821 Other seborrheic keratosis: Secondary | ICD-10-CM | POA: Diagnosis not present

## 2023-12-21 DIAGNOSIS — Z85828 Personal history of other malignant neoplasm of skin: Secondary | ICD-10-CM | POA: Diagnosis not present

## 2023-12-21 DIAGNOSIS — L812 Freckles: Secondary | ICD-10-CM | POA: Diagnosis not present

## 2023-12-21 DIAGNOSIS — D2271 Melanocytic nevi of right lower limb, including hip: Secondary | ICD-10-CM | POA: Diagnosis not present

## 2023-12-21 DIAGNOSIS — L853 Xerosis cutis: Secondary | ICD-10-CM | POA: Diagnosis not present

## 2023-12-21 DIAGNOSIS — D1801 Hemangioma of skin and subcutaneous tissue: Secondary | ICD-10-CM | POA: Diagnosis not present

## 2023-12-21 DIAGNOSIS — D3612 Benign neoplasm of peripheral nerves and autonomic nervous system, upper limb, including shoulder: Secondary | ICD-10-CM | POA: Diagnosis not present

## 2023-12-23 ENCOUNTER — Telehealth: Payer: Self-pay | Admitting: Internal Medicine

## 2023-12-23 NOTE — Telephone Encounter (Signed)
 Copied from CRM [id number]. Topic: Clinical - Medication Question >> Dec 23, 2023 11:00 AM Marlan Silva wrote: Reason for CRM: Patient wants to know if he has to continue his antibiotics or if he can stop them. Pt can be reached  at the number on file.  Is currently put on Ceftin  and wanted to know if he should start on his amoxicillin  which he had previously been prescribed; instructed to stop the amoxicillin  and continue with the Ceftin  as ordered.

## 2023-12-24 NOTE — Telephone Encounter (Signed)
 Spoke with pt and was able to inform her of the providers information of the following about his Abx " Finish 2 weeks of the Ceftin  (cefuroxime ) antibiotic as prescribed.  Stop amoxicillin .  Thank you"  Pt stated understanding and has no questions or concerns at this time.

## 2023-12-24 NOTE — Telephone Encounter (Addendum)
 Finish 2 weeks of the Ceftin  (cefuroxime ) antibiotic as prescribed.  Stop amoxicillin .  Thank you

## 2023-12-29 ENCOUNTER — Ambulatory Visit: Admitting: Internal Medicine

## 2024-01-03 ENCOUNTER — Encounter: Payer: Self-pay | Admitting: Internal Medicine

## 2024-01-03 ENCOUNTER — Ambulatory Visit (INDEPENDENT_AMBULATORY_CARE_PROVIDER_SITE_OTHER): Admitting: Internal Medicine

## 2024-01-03 VITALS — BP 124/68 | HR 71 | Temp 97.7°F | Ht 70.0 in | Wt 193.0 lb

## 2024-01-03 DIAGNOSIS — J479 Bronchiectasis, uncomplicated: Secondary | ICD-10-CM | POA: Diagnosis not present

## 2024-01-03 DIAGNOSIS — E785 Hyperlipidemia, unspecified: Secondary | ICD-10-CM | POA: Diagnosis not present

## 2024-01-03 DIAGNOSIS — I10 Essential (primary) hypertension: Secondary | ICD-10-CM | POA: Diagnosis not present

## 2024-01-03 DIAGNOSIS — R42 Dizziness and giddiness: Secondary | ICD-10-CM

## 2024-01-03 DIAGNOSIS — R0609 Other forms of dyspnea: Secondary | ICD-10-CM | POA: Diagnosis not present

## 2024-01-03 DIAGNOSIS — R531 Weakness: Secondary | ICD-10-CM | POA: Diagnosis not present

## 2024-01-03 LAB — URINALYSIS, ROUTINE W REFLEX MICROSCOPIC
Bilirubin Urine: NEGATIVE
Ketones, ur: NEGATIVE
Leukocytes,Ua: NEGATIVE
Nitrite: NEGATIVE
Specific Gravity, Urine: 1.02 (ref 1.000–1.030)
Urine Glucose: NEGATIVE
Urobilinogen, UA: 0.2 (ref 0.0–1.0)
pH: 6 (ref 5.0–8.0)

## 2024-01-03 LAB — CBC WITH DIFFERENTIAL/PLATELET
Basophils Absolute: 0 10*3/uL (ref 0.0–0.1)
Basophils Relative: 1.1 % (ref 0.0–3.0)
Eosinophils Absolute: 0.1 10*3/uL (ref 0.0–0.7)
Eosinophils Relative: 1.8 % (ref 0.0–5.0)
HCT: 33.1 % — ABNORMAL LOW (ref 39.0–52.0)
Hemoglobin: 11.2 g/dL — ABNORMAL LOW (ref 13.0–17.0)
Lymphocytes Relative: 35.6 % (ref 12.0–46.0)
Lymphs Abs: 1.3 10*3/uL (ref 0.7–4.0)
MCHC: 33.8 g/dL (ref 30.0–36.0)
MCV: 92.7 fl (ref 78.0–100.0)
Monocytes Absolute: 0.4 10*3/uL (ref 0.1–1.0)
Monocytes Relative: 10.6 % (ref 3.0–12.0)
Neutro Abs: 1.9 10*3/uL (ref 1.4–7.7)
Neutrophils Relative %: 50.9 % (ref 43.0–77.0)
Platelets: 85 10*3/uL — ABNORMAL LOW (ref 150.0–400.0)
RBC: 3.57 Mil/uL — ABNORMAL LOW (ref 4.22–5.81)
RDW: 15.4 % (ref 11.5–15.5)
WBC: 3.8 10*3/uL — ABNORMAL LOW (ref 4.0–10.5)

## 2024-01-03 LAB — CK: Total CK: 75 U/L (ref 7–232)

## 2024-01-03 MED ORDER — PREDNISONE 10 MG PO TABS: ORAL_TABLET | ORAL | 0 refills | Status: DC

## 2024-01-03 NOTE — Progress Notes (Signed)
 Subjective:  Patient ID: Curtis Jacobs, male    DOB: [birth year]  Age: 89 y.o. MRN: [id number]  CC: Medical Management of Chronic Issues (2 WEEK F/U, Dizzy spells, shaky feeling, brain fog, exertion increases sxs and standing straight up.)   HPI Curtis Jacobs presents for weakness, dizziness, pneumonia - he finished Ceftin  Not feeling well C/o DOE, being lightheaded, "brain fog". Not better Here w/wife  Outpatient Medications Prior to Visit  Medication Sig Dispense Refill   albuterol  (VENTOLIN  HFA) 108 (90 Base) MCG/ACT inhaler Inhale 2 puffs into the lungs every 6 (six) hours as needed for wheezing or shortness of breath. 8 g 5   aspirin  EC 81 MG tablet Take 1 tablet (81 mg total) by mouth daily. Swallow whole. 90 tablet 3   cetirizine (ZYRTEC) 10 MG tablet Take 1 tablet by mouth daily.     ferrous sulfate  324 MG TBEC Take 324 mg by mouth. Nature Made     finasteride  (PROSCAR ) 5 MG tablet Take 5 mg by mouth daily.     fluocinonide cream (LIDEX) 0.05 % Apply 1 application topically daily.     guaiFENesin  (MUCINEX ) 600 MG 12 hr tablet Take 1 tablet (600 mg total) by mouth 2 (two) times daily. 60 tablet 0   Multiple Vitamin (MULTIVITAMIN) tablet Take 1 tablet by mouth daily.     rosuvastatin  (CRESTOR ) 20 MG tablet TAKE 1 TABLET(20 MG) BY MOUTH DAILY 90 tablet 3   terazosin  (HYTRIN ) 10 MG capsule TAKE 1 CAPSULE BY MOUTH EVERY NIGHT AT BEDTIME. ANNUAL APPT DUE IN [place]. 90 capsule 3   amoxicillin  (AMOXIL ) 500 MG capsule Take 1,000 mg by mouth 2 (two) times daily.     cefUROXime  (CEFTIN ) 500 MG tablet Take 1 tablet (500 mg total) by mouth 2 (two) times daily with a meal. 28 tablet 0   amLODipine  (NORVASC ) 2.5 MG tablet Take 1 tablet (2.5 mg total) by mouth daily. (Patient not taking: Reported on 10/31/2023) 90 tablet 3   pantoprazole  (PROTONIX ) 40 MG tablet Take 1 tablet (40 mg total) by mouth daily before breakfast. 30 tablet 0   No facility-administered medications prior to visit.     ROS: Review of Systems  Constitutional:  Positive for fatigue. Negative for appetite change and unexpected weight change.  HENT:  Negative for congestion, nosebleeds, sneezing, sore throat and trouble swallowing.   Eyes:  Negative for itching and visual disturbance.  Respiratory:  Positive for shortness of breath. Negative for cough, chest tightness and wheezing.   Cardiovascular:  Negative for chest pain, palpitations and leg swelling.  Gastrointestinal:  Negative for abdominal distention, blood in stool, diarrhea and nausea.  Genitourinary:  Negative for frequency and hematuria.  Musculoskeletal:  Positive for arthralgias and back pain. Negative for gait problem, joint swelling, neck pain and neck stiffness.  Skin:  Negative for rash.  Neurological:  Positive for weakness. Negative for dizziness, tremors, speech difficulty and light-headedness.  Hematological:  Does not bruise/bleed easily.  Psychiatric/Behavioral:  Negative for agitation, dysphoric mood, sleep disturbance and suicidal ideas. The patient is not nervous/anxious.     Objective:  BP 124/68   Pulse 71   Temp 97.7 F (36.5 C) (Oral)   Ht 5\' 10"  (1.778 m)   Wt 193 lb (87.5 kg)   SpO2 97%   BMI 27.69 kg/m   BP Readings from Last 3 Encounters:  01/03/24 124/68  12/13/23 132/62  12/01/23 137/73    Wt Readings from Last 3 Encounters:  01/03/24 193  lb (87.5 kg)  12/13/23 194 lb 12.8 oz (88.4 kg)  11/22/23 191 lb 12.8 oz (87 kg)    Physical Exam Constitutional:      General: He is not in acute distress.    Appearance: He is well-developed. He is obese.     Comments: NAD  Eyes:     Conjunctiva/sclera: Conjunctivae normal.     Pupils: Pupils are equal, round, and reactive to light.  Neck:     Thyroid : No thyromegaly.     Vascular: No JVD.  Cardiovascular:     Rate and Rhythm: Normal rate and regular rhythm.     Heart sounds: Normal heart sounds. No murmur heard.    No friction rub. No gallop.   Pulmonary:     Effort: Pulmonary effort is normal. No respiratory distress.     Breath sounds: Normal breath sounds. No wheezing or rales.  Chest:     Chest wall: No tenderness.  Abdominal:     General: Bowel sounds are normal. There is no distension.     Palpations: Abdomen is soft. There is no mass.     Tenderness: There is no abdominal tenderness. There is no guarding or rebound.  Musculoskeletal:        General: No tenderness. Normal range of motion.     Cervical back: Normal range of motion.  Lymphadenopathy:     Cervical: No cervical adenopathy.  Skin:    General: Skin is warm and dry.     Findings: No rash.  Neurological:     Mental Status: He is alert and oriented to person, place, and time.     Cranial Nerves: No cranial nerve deficit.     Motor: No abnormal muscle tone.     Coordination: Coordination normal.     Gait: Gait normal.     Deep Tendon Reflexes: Reflexes are normal and symmetric.  Psychiatric:        Behavior: Behavior normal.        Thought Content: Thought content normal.        Judgment: Judgment normal.   Slight DOE    Lab Results  Component Value Date   WBC 4.4 12/13/2023   HGB 10.9 (L) 12/13/2023   HCT 32.6 (L) 12/13/2023   PLT 85.0 (L) 12/13/2023   GLUCOSE 115 (H) 12/13/2023   CHOL 129 11/22/2023   TRIG 137.0 11/22/2023   HDL 36.20 (L) 11/22/2023   LDLDIRECT 101.0 01/10/2015   LDLCALC 66 11/22/2023   ALT 12 12/13/2023   AST 17 12/13/2023   NA 141 12/13/2023   K 4.2 12/13/2023   CL 108 12/13/2023   CREATININE 1.47 12/13/2023   BUN 17 12/13/2023   CO2 26 12/13/2023   TSH 2.95 12/13/2023   PSA 17.10 (H) 11/22/2023   HGBA1C 5.8 02/02/2019    DG CHEST PORT 1 VIEW Result Date: 10/31/2023 CLINICAL DATA:  Short of breath. EXAM: PORTABLE CHEST 1 VIEW COMPARISON:  08/08/2019 and prior studies.  CT, 08/08/2021. FINDINGS: Cardiac silhouette is normal in size. No mediastinal or hilar masses. Lungs demonstrate chronic interstitial prominence  and areas of right mid and upper lung scarring. No evidence of pneumonia or pulmonary edema. No pleural effusion or pneumothorax. Skeletal structures are grossly intact. IMPRESSION: 1. No acute cardiopulmonary disease. Electronically Signed   By: Amanda Jungling M.D.   On: 10/31/2023 16:15   CT Renal Stone Study Result Date: 10/31/2023 CLINICAL DATA:  Told by urology that he had a kidney stone. Urosepsis. Recent cystoscopy.  EXAM: CT ABDOMEN AND PELVIS WITHOUT CONTRAST TECHNIQUE: Multidetector CT imaging of the abdomen and pelvis was performed following the standard protocol without IV contrast. RADIATION DOSE REDUCTION: This exam was performed according to the departmental dose-optimization program which includes automated exposure control, adjustment of the mA and/or kV according to patient size and/or use of iterative reconstruction technique. COMPARISON:  CT scan abdomen and pelvis from 10/14/2023. FINDINGS: Lower chest: There are subpleural atelectatic changes in the visualized lung bases. No overt consolidation. No pleural effusion. The heart is normal in size. No pericardial effusion. Hepatobiliary: The liver is normal in size. Non-cirrhotic configuration. No suspicious mass. These is mild diffuse hepatic steatosis. No intrahepatic or extrahepatic bile duct dilation. Small volume calcified gallstones noted without imaging signs of acute cholecystitis. Normal gallbladder wall thickness. No pericholecystic inflammatory changes. Pancreas: Unremarkable. No pancreatic ductal dilatation or surrounding inflammatory changes. Spleen: Within normal limits. No focal lesion. Adrenals/Urinary Tract: Adrenal glands are unremarkable. No suspicious renal mass within the limitations of this unenhanced exam. Redemonstration of 7 x 13 mm left renal pelvic stone noted with significant associated urothelial thickening. There is mild fullness in the left renal collecting system without frank hydronephrosis. There is a stable 3 x 7  mm nonobstructing calculus in the left kidney lower pole calyx. No left ureterolithiasis. However, there is mild prominence of left ureter and mild left periureteric fat stranding, which is new since the prior study and may represent superimposed urinary tract infection/ureteritis. There is a 3 mm nonobstructing calculus in the right kidney lower pole calyx. No right ureterolithiasis. There is mild right periureteric fat stranding as well, favoring superimposed urinary tract infection. Mild right kidney lower pole caliectasis. However, no frank right hydroureteronephrosis. Redemonstration of a probable mobile 8 x 10 mm calculus in the posterior portion of urinary bladder, which appears in different position when compared to the recent prior exam from 10/14/2023. Urinary bladder is otherwise unremarkable. No focal mass or perivesical fat stranding there is interval clearing of previously noted smaller bladder calculus. Stomach/Bowel: No disproportionate dilation of the small or large bowel loops. No evidence of abnormal bowel wall thickening or inflammatory changes. The appendix was not visualized; however there is no acute inflammatory process in the right lower quadrant. There are scattered diverticula mainly in the sigmoid colon, without imaging signs of diverticulitis. Vascular/Lymphatic: No ascites or pneumoperitoneum. No abdominal or pelvic lymphadenopathy, by size criteria. No aneurysmal dilation of the major abdominal arteries. There are moderate peripheral atherosclerotic vascular calcifications of the aorta and its major branches. Reproductive: Enlarged prostate. Symmetric seminal vesicles. Other: The visualized soft tissues and abdominal wall are unremarkable. Musculoskeletal: No suspicious osseous lesions. There are mild - moderate multilevel degenerative changes in the visualized spine. IMPRESSION: 1. Redemonstration of left renal pelvic calculus (7 x 13 mm) with associated urothelial thickening. There  is mild fullness in the left renal collecting system without frank hydronephrosis. There is focal caliectasis in the right kidney lower pole. Mild prominence of bilateral ureters and mild periureteric fat stranding, which is new since the prior study and may represent superimposed urinary tract infection/ureteritis. Correlate clinically and with urinalysis. 2. There is a 8 x 10 mm calculus in the urinary bladder. Previously seen other smaller calculus has resolved in the interim. 3. Multiple other nonacute observations (such as cholelithiasis without acute cholecystitis, colonic diverticula, prostatomegaly, etc.), As described above. 4. Aortic atherosclerosis. Aortic Atherosclerosis (ICD10-I70.0). Electronically Signed   By: Beula Brunswick M.D.   On: 10/31/2023 11:08  Assessment & Plan:   Problem List Items Addressed This Visit     Essential hypertension   Not better Hold Terazosin       Dizziness   Not better Check labs again including cortisol, D-dimer, CBC Consider Chest CT angio Prednisone  10 mg: take 4 tabs a day x 3 days; then 3 tabs a day x 4 days; then 2 tabs a day x 4 days, then 1 tab a day x 6 days, then stop. Take pc. Hold Rosuvastatin , Terazosin       Relevant Orders   CK   Cortisol   CBC with Differential/Platelet   D-dimer, quantitative   Dyslipidemia    Hold Rosuvastatin       Bronchiectasis without acute exacerbation (HCC)   Recurrent pneumonia Plan - see above      Weakness - Primary   Not better Check labs again including cortisol, D-dimer, CBC Consider Chest CT angio Prednisone  10 mg: take 4 tabs a day x 3 days; then 3 tabs a day x 4 days; then 2 tabs a day x 4 days, then 1 tab a day x 6 days, then stop. Take pc. Hold Rosuvastatin , Terazosin        Relevant Orders   Urinalysis   DOE (dyspnea on exertion)   D dimer May need a chest CT      Relevant Orders   CK   Cortisol   CBC with Differential/Platelet   D-dimer, quantitative   Urinalysis       Meds ordered this encounter  Medications   predniSONE  (DELTASONE ) 10 MG tablet    Sig: Prednisone  10 mg: take 4 tabs a day x 3 days; then 3 tabs a day x 4 days; then 2 tabs a day x 4 days, then 1 tab a day x 6 days, then stop. Take pc.    Dispense:  38 tablet    Refill:  0      Follow-up: Return in about 2 weeks (around 01/17/2024) for a follow-up visit.  Anitra Barn, MD

## 2024-01-03 NOTE — Assessment & Plan Note (Signed)
 Recurrent pneumonia Plan - see above

## 2024-01-03 NOTE — Assessment & Plan Note (Signed)
 D dimer May need a chest CT

## 2024-01-03 NOTE — Assessment & Plan Note (Addendum)
Hold Rosuvastatin.

## 2024-01-03 NOTE — Patient Instructions (Signed)
  Hold Rosuvastatin , Terazosin

## 2024-01-03 NOTE — Assessment & Plan Note (Signed)
 Not better Hold Terazosin 

## 2024-01-03 NOTE — Assessment & Plan Note (Signed)
 Not better Check labs again including cortisol, D-dimer, CBC Consider Chest CT angio Prednisone  10 mg: take 4 tabs a day x 3 days; then 3 tabs a day x 4 days; then 2 tabs a day x 4 days, then 1 tab a day x 6 days, then stop. Take pc. Hold Rosuvastatin , Terazosin 

## 2024-01-04 ENCOUNTER — Ambulatory Visit: Payer: Self-pay | Admitting: Internal Medicine

## 2024-01-04 ENCOUNTER — Ambulatory Visit
Admission: RE | Admit: 2024-01-04 | Discharge: 2024-01-04 | Disposition: A | Discharge status: Home / Self Care | Source: Ambulatory Visit | Attending: Internal Medicine | Admitting: Internal Medicine

## 2024-01-04 DIAGNOSIS — R531 Weakness: Secondary | ICD-10-CM

## 2024-01-04 DIAGNOSIS — R0609 Other forms of dyspnea: Secondary | ICD-10-CM

## 2024-01-04 DIAGNOSIS — J479 Bronchiectasis, uncomplicated: Secondary | ICD-10-CM | POA: Diagnosis not present

## 2024-01-04 DIAGNOSIS — I7121 Aneurysm of the ascending aorta, without rupture: Secondary | ICD-10-CM | POA: Diagnosis not present

## 2024-01-04 DIAGNOSIS — R918 Other nonspecific abnormal finding of lung field: Secondary | ICD-10-CM | POA: Diagnosis not present

## 2024-01-04 LAB — CORTISOL: Cortisol, Plasma: 6 ug/dL

## 2024-01-04 LAB — D-DIMER, QUANTITATIVE: D-Dimer, Quant: 0.88 ug{FEU}/mL — ABNORMAL HIGH (ref <0.50)

## 2024-01-04 MED ORDER — IOPAMIDOL (ISOVUE-370) INJECTION 76%
75.0000 mL | Freq: Once | INTRAVENOUS | Status: AC | PRN
  Administered 2024-01-04: 75 mL via INTRAVENOUS

## 2024-01-12 ENCOUNTER — Other Ambulatory Visit (HOSPITAL_COMMUNITY): Payer: Self-pay

## 2024-01-18 ENCOUNTER — Encounter: Payer: Self-pay | Admitting: Internal Medicine

## 2024-01-18 ENCOUNTER — Ambulatory Visit (INDEPENDENT_AMBULATORY_CARE_PROVIDER_SITE_OTHER): Admitting: Internal Medicine

## 2024-01-18 VITALS — BP 120/70 | HR 72 | Temp 98.2°F | Ht 70.0 in | Wt 193.0 lb

## 2024-01-18 DIAGNOSIS — N200 Calculus of kidney: Secondary | ICD-10-CM | POA: Diagnosis not present

## 2024-01-18 DIAGNOSIS — R0609 Other forms of dyspnea: Secondary | ICD-10-CM | POA: Diagnosis not present

## 2024-01-18 DIAGNOSIS — J479 Bronchiectasis, uncomplicated: Secondary | ICD-10-CM

## 2024-01-18 DIAGNOSIS — R42 Dizziness and giddiness: Secondary | ICD-10-CM | POA: Diagnosis not present

## 2024-01-18 DIAGNOSIS — D61818 Other pancytopenia: Secondary | ICD-10-CM | POA: Diagnosis not present

## 2024-01-18 MED ORDER — PREDNISONE 10 MG PO TABS: ORAL_TABLET | ORAL | 1 refills | Status: DC

## 2024-01-18 NOTE — Assessment & Plan Note (Signed)
 Better after steroids  Chest CT w/o CHF, PE Pulm ref was made - Eulas Hick, NP

## 2024-01-18 NOTE — Assessment & Plan Note (Addendum)
 Better after steroids and off Terazosin  and Crestor  Chest CT w/o CHF, PE Pulm ref was made - Eulas Hick, NP  We may need to extend prednisone  10 mg/d  for longer  Potential benefits of a long term steroid  use as well as potential risks  and complications were explained to the patient and were aknowledged.

## 2024-01-18 NOTE — Progress Notes (Signed)
 Subjective:  Patient ID: Curtis Jacobs, male    DOB: April 24, 1933  Age: 89 y.o. MRN: [id number]  CC: Medical Management of Chronic Issues (2 WEE)   HPI Curtis Jacobs presents for DOE, dizziness, lightheadedness Better after steroids and off Terazosin  and Crestor   Outpatient Medications Prior to Visit  Medication Sig Dispense Refill   albuterol  (VENTOLIN  HFA) 108 (90 Base) MCG/ACT inhaler Inhale 2 puffs into the lungs every 6 (six) hours as needed for wheezing or shortness of breath. 8 g 5   aspirin  EC 81 MG tablet Take 1 tablet (81 mg total) by mouth daily. Swallow whole. 90 tablet 3   cetirizine (ZYRTEC) 10 MG tablet Take 1 tablet by mouth daily.     ferrous sulfate  324 MG TBEC Take 324 mg by mouth. Nature Made     finasteride  (PROSCAR ) 5 MG tablet Take 5 mg by mouth daily.     fluocinonide cream (LIDEX) 0.05 % Apply 1 application topically daily.     guaiFENesin  (MUCINEX ) 600 MG 12 hr tablet Take 1 tablet (600 mg total) by mouth 2 (two) times daily. 60 tablet 0   Multiple Vitamin (MULTIVITAMIN) tablet Take 1 tablet by mouth daily.     rosuvastatin  (CRESTOR ) 20 MG tablet TAKE 1 TABLET(20 MG) BY MOUTH DAILY 90 tablet 3   terazosin  (HYTRIN ) 10 MG capsule TAKE 1 CAPSULE BY MOUTH EVERY NIGHT AT BEDTIME. ANNUAL APPT DUE IN [place]. 90 capsule 3   predniSONE  (DELTASONE ) 10 MG tablet Prednisone  10 mg: take 4 tabs a day x 3 days; then 3 tabs a day x 4 days; then 2 tabs a day x 4 days, then 1 tab a day x 6 days, then stop. Take pc. 38 tablet 0   amLODipine  (NORVASC ) 2.5 MG tablet Take 1 tablet (2.5 mg total) by mouth daily. (Patient not taking: Reported on 10/31/2023) 90 tablet 3   pantoprazole  (PROTONIX ) 40 MG tablet Take 1 tablet (40 mg total) by mouth daily before breakfast. 30 tablet 0   No facility-administered medications prior to visit.    ROS: Review of Systems  Constitutional:  Negative for appetite change, fatigue and unexpected weight change.  HENT:  Positive for congestion. Negative  for nosebleeds, sneezing, sore throat, trouble swallowing and voice change.   Eyes:  Negative for itching and visual disturbance.  Respiratory:  Positive for shortness of breath and wheezing. Negative for cough.   Cardiovascular:  Negative for chest pain, palpitations and leg swelling.  Gastrointestinal:  Negative for abdominal distention, blood in stool, diarrhea and nausea.  Genitourinary:  Negative for frequency and hematuria.  Musculoskeletal:  Positive for back pain. Negative for gait problem, joint swelling and neck pain.  Skin:  Negative for rash.  Neurological:  Positive for dizziness. Negative for tremors, seizures, speech difficulty, weakness and light-headedness.  Psychiatric/Behavioral:  Negative for agitation, dysphoric mood and sleep disturbance. The patient is not nervous/anxious.     Objective:  BP 120/70   Pulse 72   Temp 98.2 F (36.8 C) (Oral)   Ht 5\' 10"  (1.778 m)   Wt 193 lb (87.5 kg)   SpO2 96%   BMI 27.69 kg/m   BP Readings from Last 3 Encounters:  01/18/24 120/70  01/03/24 124/68  12/13/23 132/62    Wt Readings from Last 3 Encounters:  01/18/24 193 lb (87.5 kg)  01/03/24 193 lb (87.5 kg)  12/13/23 194 lb 12.8 oz (88.4 kg)    Physical Exam Constitutional:      General:  He is not in acute distress.    Appearance: He is well-developed.     Comments: NAD  Eyes:     Conjunctiva/sclera: Conjunctivae normal.     Pupils: Pupils are equal, round, and reactive to light.  Neck:     Thyroid : No thyromegaly.     Vascular: No JVD.  Cardiovascular:     Rate and Rhythm: Normal rate and regular rhythm.     Heart sounds: Normal heart sounds. No murmur heard.    No friction rub. No gallop.  Pulmonary:     Effort: Pulmonary effort is normal. No respiratory distress.     Breath sounds: Normal breath sounds. No wheezing or rales.  Chest:     Chest wall: No tenderness.  Abdominal:     General: Bowel sounds are normal. There is no distension.     Palpations:  Abdomen is soft. There is no mass.     Tenderness: There is no abdominal tenderness. There is no guarding or rebound.  Musculoskeletal:        General: No tenderness. Normal range of motion.     Cervical back: Normal range of motion.     Right lower leg: No edema.     Left lower leg: No edema.  Lymphadenopathy:     Cervical: No cervical adenopathy.  Skin:    General: Skin is warm and dry.     Findings: No rash.  Neurological:     Mental Status: He is alert and oriented to person, place, and time.     Cranial Nerves: No cranial nerve deficit.     Motor: No abnormal muscle tone.     Coordination: Coordination normal.     Gait: Gait normal.     Deep Tendon Reflexes: Reflexes are normal and symmetric.  Psychiatric:        Behavior: Behavior normal.        Thought Content: Thought content normal.        Judgment: Judgment normal.     Lab Results  Component Value Date   WBC 3.8 (L) 01/03/2024   HGB 11.2 (L) 01/03/2024   HCT 33.1 (L) 01/03/2024   PLT 85.0 (L) 01/03/2024   GLUCOSE 115 (H) 12/13/2023   CHOL 129 11/22/2023   TRIG 137.0 11/22/2023   HDL 36.20 (L) 11/22/2023   LDLDIRECT 101.0 01/10/2015   LDLCALC 66 11/22/2023   ALT 12 12/13/2023   AST 17 12/13/2023   NA 141 12/13/2023   K 4.2 12/13/2023   CL 108 12/13/2023   CREATININE 1.47 12/13/2023   BUN 17 12/13/2023   CO2 26 12/13/2023   TSH 2.95 12/13/2023   PSA 17.10 (H) 11/22/2023   HGBA1C 5.8 02/02/2019    CT Angio Chest Pulmonary Embolism (PE) W or WO Contrast Result Date: 01/04/2024 CLINICAL DATA:  Pulmonary embolism (PE) suspected, high prob Dyspnea on exertion, elevated D-dimer. Dyspnea on exertion. Generalized weakness. Shortness of breath. EXAM: CT ANGIOGRAPHY CHEST WITH CONTRAST TECHNIQUE: Multidetector CT imaging of the chest was performed using the standard protocol during bolus administration of intravenous contrast. Multiplanar CT image reconstructions and MIPs were obtained to evaluate the vascular  anatomy. RADIATION DOSE REDUCTION: This exam was performed according to the departmental dose-optimization program which includes automated exposure control, adjustment of the mA and/or kV according to patient size and/or use of iterative reconstruction technique. CONTRAST:  75mL ISOVUE -370 IOPAMIDOL  (ISOVUE -370) INJECTION 76% COMPARISON:  CT scan chest from 08/08/2021. FINDINGS: Cardiovascular: No evidence of embolism to the proximal subsegmental pulmonary  artery level. Normal cardiac size. No pericardial effusion. Redemonstration of dilation of ascending thoracic aorta measuring up to 4.1 x 4.2 cm on this non cardiac gated exam. Follow-up examination is recommended with CTA chest with cardiac gating for accurate measurement and device follow-up strategy. There are coronary artery calcifications, in keeping with coronary artery disease. There are also moderate peripheral atherosclerotic vascular calcifications of thoracic aorta and its major branches. Mediastinum/Nodes: Visualized thyroid  gland appears grossly unremarkable. No solid / cystic mediastinal masses. The esophagus is nondistended precluding optimal assessment. There are few mildly prominent mediastinal and hilar lymph nodes, which do not meet the size criteria for lymphadenopathy and appear grossly similar to the prior study, favoring benign etiology. No axillary lymphadenopathy by size criteria. Lungs/Pleura: The central tracheo-bronchial tree is patent. Redemonstration of postsurgical changes from prior right upper lobe wedge resection. There are stable areas of fibrosis/scarring in the right lung. Redemonstration of biapical pleuroparenchymal disease, right more than left. Redemonstration of patchy areas of bronchiectasis throughout bilateral lungs, essentially unchanged. There also associated scattered nonspecific pulmonary nodules throughout bilateral lungs. 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 No  new mass or consolidation. No pleural effusion or pneumothorax. No new suspicious lung nodule seen. Stable calcified granuloma in the left lung upper lobe as well as adjacent additional irregular soft tissue nodule (series 7, images 56 and 49). Upper Abdomen: Small volume calcified gallstones noted without imaging signs of acute cholecystitis. There is small sliding hiatal hernia. Remaining visualized upper abdominal viscera within normal limits. Musculoskeletal: The visualized soft tissues of the chest wall are grossly unremarkable. No suspicious osseous lesions. There are mild multilevel degenerative changes in the visualized spine. Redemonstration of postsurgical changes in the right posteromedial sixth rib. Review of the MIP images confirms the above findings. IMPRESSION: 1. No embolism to the proximal subsegmental pulmonary artery level. 2. Redemonstration of dilation of ascending thoracic aorta measuring up to 4.1 x 4.2 cm on this non cardiac gated exam. Follow-up examination is recommended with CTA chest with cardiac gating for accurate measurement and to device a follow-up strategy. 3. Redemonstration of postsurgical changes from prior right upper lobe wedge resection. There are stable areas of fibrosis/scarring in the right lung. Redemonstration of patchy areas of bronchiectasis throughout bilateral lungs, essentially unchanged. There are also associated scattered nonspecific pulmonary nodules throughout bilateral lungs. 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 No new mass or consolidation. No pleural effusion or pneumothorax. No new suspicious lung nodule seen. 4. Multiple other nonacute observations, as described above. Aortic Atherosclerosis (ICD10-I70.0). Electronically Signed   By: Beula Brunswick M.D.   On: 01/04/2024 11:53    Assessment & Plan:   Problem List Items Addressed This Visit     Dizziness   Better after steroids  Chest CT w/o CHF,  PE Pulm ref was made - Eulas Hick, NP      Bronchiectasis without acute exacerbation (HCC)   Better after steroids  Chest CT w/o CHF, PE Pulm ref was made - Eulas Hick, NP      Relevant Orders   Ambulatory referral to Pulmonology   Pancytopenia [facility])   Nephrolithiasis   F/u w/Urology      DOE (dyspnea on exertion) - Primary   Better after steroids and off Terazosin  and Crestor  Chest CT w/o CHF, PE Pulm ref was made - Eulas Hick, NP  We may need to extend prednisone  10 mg/d  for longer  Potential benefits of a long  term steroid  use as well as potential risks  and complications were explained to the patient and were aknowledged.       Relevant Orders   Ambulatory referral to Pulmonology      Meds ordered this encounter  Medications   predniSONE  (DELTASONE ) 10 MG tablet    Sig: Prednisone  10 mg a day. Take pc.    Dispense:  30 tablet    Refill:  1      Follow-up: Return in about 2 months (around 03/19/2024) for a follow-up visit.  Anitra Barn, MD

## 2024-01-18 NOTE — Assessment & Plan Note (Signed)
F/u w/Urology 

## 2024-01-24 NOTE — Progress Notes (Deleted)
 Cardiology Clinic Note   Patient Name: Curtis Jacobs Date of Encounter: 01/24/2024  Primary Care Provider:  Genia Kettering, MD Primary Cardiologist:  Alexandria Angel, MD  Patient Profile    Curtis Jacobs 89 year old male presents to the clinic today for follow-up evaluation of his coronary artery disease and hypertension.  Past Medical History    Past Medical History:  Diagnosis Date   BPH (benign prostatic hypertrophy)    Dr. Bosie Bye   CAD (coronary artery disease)    HA (headache)    Traction- Dr. Tilda Fogo- had MRI.MRA   HTN (hypertension)    Hyperlipidemia    Past Surgical History:  Procedure Laterality Date   EXCISIONAL HEMORRHOIDECTOMY     LUNG REMOVAL, PARTIAL Right     Allergies  Allergies  Allergen Reactions   Cordran  [Flurandrenolide ] Rash    rash   Lisinopril Cough    REACTION: cough    History of Present Illness    Curtis Jacobs has a PMH of coronary artery disease, HLD, HTN, and BPH.  Carotid Dopplers 12/20 showed no stenosis.  Chest CT 6/22 showed bronchiectasis, pulmonary nodules, and three-vessel coronary atherosclerosis with aortic atherosclerosis as well.  Echocardiogram 10/22 showed normal LV function, mild LVH, G1 DD, mild right ventricular enlargement, mild mitral regurgitation, mild aortic insufficiency, dilated ascending aorta measuring 44 mm and dilated aortic root measuring 45 mm.  Chest CT 12/22 showed previous right upper lobe resection, bronchiectasis and ectatic ascending aorta measuring 40 mm.  He was seen in follow-up by Dr. Audery Blazing on 11/18/2021.  During that time he continued to have dyspnea on exertion which he attributed to his bronchiectasis.  He denied orthopnea and PND.  He denied lower extremity swelling, chest pain and syncope.  He contacted the nurse triage line and reported that he was not feeling well.  He noted that he also had elevated blood pressure.  He presented to the clinic 07/05/23 for follow-up evaluation and stated  he had noticed over the past several weeks he had  increased  blood pressure.  He noted at home that his blood pressure was routinely in the 140s over 80s.  In the clinic initially his bp was146/80 and on recheck was 142/80.  He continued to have back discomfort related to his lumbar stenosis.  This limited his physical activity.  He took Tylenol  for this.  We reviewed secondary causes of hypertension.  I started amlodipine  2.5 mg daily, gave salty 6 diet sheet and planned follow-up in 1-2 months.  He presents to the clinic today for follow-up evaluation and states his blood pressure has been better controlled with amlodipine .  He remains very physically active working in his wood shop.  He continues to have back pain related to spinal stenosis.  He has been receiving steroid injections which have not provided much relief.  We briefly reviewed PRP and stem cell injections.  He reports that he will investigate these with his PCP.  I will continue his current medication regimen, have him continue to monitor his blood pressure, continue to eat low-sodium diet and maintain his physical activity.  Will plan follow-up in 6 months.  Today he denies chest pain, shortness of breath, lower extremity edema, fatigue, palpitations, melena, hematuria, hemoptysis, diaphoresis, weakness, presyncope, syncope, orthopnea, and PND.   Home Medications    Prior to Admission medications   Medication Sig Start Date End Date Taking? Authorizing Provider  aspirin  EC 81 MG tablet Take 1 tablet (81 mg total)  by mouth daily. Swallow whole. 11/18/21   Lenise Quince, MD  cetirizine (ZYRTEC) 10 MG tablet Take 1 tablet by mouth daily.    [provider]  Cholecalciferol 1000 UNITS tablet Take 2,000 Units by mouth daily.    [provider]  Dapsone  7.5 % GEL Apply 1 application topically daily. 12/10/20   Plotnikov, Aleksei V, MD  fluocinonide cream (LIDEX) 0.05 % Apply 1 application topically daily. 04/30/16    [provider]  Glucosamine-Chondroit-Vit C-Mn (GLUCOSAMINE CHONDROITIN COMPLX) CAPS Take 1 capsule by mouth daily.    [provider]  guaiFENesin  (MUCINEX ) 600 MG 12 hr tablet Take 2 tablets (1,200 mg total) by mouth 2 (two) times daily. Patient taking differently: Take 1,200 mg by mouth daily. 08/05/15   McQuaid, Douglas B, MD  ipratropium (ATROVENT ) 0.03 % nasal spray Place 2 sprays into both nostrils every 12 (twelve) hours. 09/07/22   Antonio Baumgarten, NP  ipratropium-albuterol  (DUONEB) 0.5-2.5 (3) MG/3ML SOLN Take 3 mLs by nebulization every 6 (six) hours as needed. 12/10/20   Plotnikov, Aleksei V, MD  loratadine  (CLARITIN ) 10 MG tablet Take 10 mg by mouth daily.    [provider]  Multiple Vitamin (MULTIVITAMIN) tablet Take 1 tablet by mouth daily.    [provider]  mupirocin ointment (BACTROBAN) 2 %  09/27/19   [provider]  rosuvastatin  (CRESTOR ) 20 MG tablet TAKE 1 TABLET(20 MG) BY MOUTH DAILY 06/18/23   Audery Blazing Deannie Fabian, MD  Saw Palmetto POWD Take by mouth daily.    [provider]  sodium chloride  HYPERTONIC 3 % nebulizer solution Take by nebulization as needed for other. Use this 2 times a day for cough. 09/03/21   Icard, Dariel Edelson L, DO  terazosin  (HYTRIN ) 10 MG capsule TAKE 1 CAPSULE BY MOUTH EVERY NIGHT AT BEDTIME. ANNUAL APPT DUE IN AUGUST. 02/26/23   Plotnikov, Aleksei V, MD  triamcinolone  ointment (KENALOG ) 0.1 % APPLY EXTERNALLY TO THE AFFECTED AREA THREE TIMES DAILY 12/08/19   Plotnikov, Aleksei V, MD  vitamin C (ASCORBIC ACID) 500 MG tablet Take 500 mg by mouth daily.    [provider]    Family History    Family History  Problem Relation Age of Onset   Heart disease Father    He indicated that his mother is deceased. He indicated that his father is deceased.  Social History    Social History   Socioeconomic History   Marital status: Married    Spouse name: Not on file   Number of children: Not on  file   Years of education: Not on file   Highest education level: Not on file  Occupational History   Not on file  Tobacco Use   Smoking status: Former    Current packs/day: 0.00    Average packs/day: 0.1 packs/day for 3.0 years (0.3 ttl pk-yrs)    Types: Cigarettes    Start date: 07/25/1949    Quit date: 07/25/1952    Years since quitting: 71.5   Smokeless tobacco: Never   Tobacco comments:    used to "smoke but not inhale"  Substance and Sexual Activity   Alcohol  use: No   Drug use: No   Sexual activity: Yes  Other Topics Concern   Not on file  Social History Narrative   Not on file   Social Drivers of Health   Financial Resource Strain: Low Risk  (04/05/2023)   Overall Financial Resource Strain (CARDIA)    Difficulty of Paying Living Expenses:  Not hard at all  Food Insecurity: No Food Insecurity (11/08/2023)   Hunger Vital Sign    Worried About Running Out of Food in the Last Year: Never true    Ran Out of Food in the Last Year: Never true  Transportation Needs: No Transportation Needs (11/08/2023)   PRAPARE - Administrator, Civil Service (Medical): No    Lack of Transportation (Non-Medical): No  Physical Activity: Sufficiently Active (04/05/2023)   Exercise Vital Sign    Days of Exercise per Week: 5 days    Minutes of Exercise per Session: 30 min  Stress: No Stress Concern Present (04/05/2023)   [employer] of Occupational Health - Occupational Stress Questionnaire    Feeling of Stress : Not at all  Social Connections: Socially Integrated (10/31/2023)   Social Connection and Isolation Panel [NHANES]    Frequency of Communication with Friends and Family: More than three times a week    Frequency of Social Gatherings with Friends and Family: Once a week    Attends Religious Services: More than 4 times per year    Active Member of [employer] or Organizations: No    Attends Engineer, structural: More than 4 times per year    Marital Status: Married   Catering manager Violence: Not At Risk (11/08/2023)   Humiliation, Afraid, Rape, and Kick questionnaire    Fear of Current or Ex-Partner: No    Emotionally Abused: No    Physically Abused: No    Sexually Abused: No     Review of Systems    General:  No chills, fever, night sweats or weight changes.  Cardiovascular:  No chest pain, dyspnea on exertion, edema, orthopnea, palpitations, paroxysmal nocturnal dyspnea. Dermatological: No rash, lesions/masses Respiratory: No cough, dyspnea Urologic: No hematuria, dysuria Abdominal:   No nausea, vomiting, diarrhea, bright red blood per rectum, melena, or hematemesis Neurologic:  No visual changes, wkns, changes in mental status. All other systems reviewed and are otherwise negative except as noted above.  Physical Exam    VS:  There were no vitals taken for this visit. , BMI There is no height or weight on file to calculate BMI. GEN: Well nourished, well developed, in no acute distress. HEENT: normal. Neck: Supple, no JVD, carotid bruits, or masses. Cardiac: RRR, no murmurs, rubs, or gallops. No clubbing, cyanosis, edema.  Radials/DP/PT 2+ and equal bilaterally.  Respiratory:  Respirations regular and unlabored, clear to auscultation bilaterally. GI: Soft, nontender, nondistended, BS + x 4. MS: no deformity or atrophy. Skin: warm and dry, no rash. Neuro:  Strength and sensation are intact. Psych: Normal affect.  Accessory Clinical Findings    Recent Labs: 11/03/2023: B Natriuretic Peptide 287.6 11/05/2023: Magnesium 2.0 12/13/2023: ALT 12; BUN 17; Creatinine, Ser 1.47; Potassium 4.2; Pro B Natriuretic peptide (BNP) 94.0; Sodium 141; TSH 2.95 01/03/2024: Hemoglobin 11.2; Platelets 85.0   Recent Lipid Panel    Component Value Date/Time   CHOL 129 11/22/2023 1227   CHOL 142 07/15/2021 0940   TRIG 137.0 11/22/2023 1227   HDL 36.20 (L) 11/22/2023 1227   HDL 42 07/15/2021 0940   CHOLHDL 4 11/22/2023 1227   VLDL 27.4 11/22/2023 1227    LDLCALC 66 11/22/2023 1227   LDLCALC 74 07/15/2021 0940   LDLDIRECT 101.0 01/10/2015 1117       ECG personally reviewed by me today-none today.    Echocardiogram 06/06/2021  IMPRESSIONS     1. Left ventricular ejection fraction by 3D volume is 65 %.  The left  ventricle has normal function. The left ventricle has no regional wall  motion abnormalities. The left ventricular internal cavity size was mildly  dilated. Left ventricular diastolic  parameters are consistent with Grade I diastolic dysfunction (impaired  relaxation).   2. Right ventricular systolic function is normal. The right ventricular  size is mildly enlarged.   3. The mitral valve is normal in structure. Mild mitral valve  regurgitation. No evidence of mitral stenosis.   4. The aortic valve is tricuspid. Aortic valve regurgitation is mild. No  aortic stenosis is present.   5. Aortic dilatation noted. There is mild dilatation of the ascending  aorta, measuring 44 mm. There is mild dilatation of the aortic root,  measuring 45 mm.   6. The inferior vena cava is normal in size with greater than 50%  respiratory variability, suggesting right atrial pressure of 3 mmHg.   FINDINGS   Left Ventricle: Left ventricular ejection fraction by 3D volume is 65 %.  The left ventricle has normal function. The left ventricle has no regional  wall motion abnormalities. The left ventricular internal cavity size was  mildly dilated. There is no left   ventricular hypertrophy. Left ventricular diastolic parameters are  consistent with Grade I diastolic dysfunction (impaired relaxation).   Right Ventricle: The right ventricular size is mildly enlarged. No  increase in right ventricular wall thickness. Right ventricular systolic  function is normal.   Left Atrium: Left atrial size was normal in size.   Right Atrium: Right atrial size was normal in size.   Pericardium: There is no evidence of pericardial effusion.   Mitral  Valve: The mitral valve is normal in structure. Mild mitral valve  regurgitation. No evidence of mitral valve stenosis.   Tricuspid Valve: The tricuspid valve is normal in structure. Tricuspid  valve regurgitation is not demonstrated. No evidence of tricuspid  stenosis.   Aortic Valve: The aortic valve is tricuspid. Aortic valve regurgitation is  mild. Aortic regurgitation PHT measures 673 msec. No aortic stenosis is  present.   Pulmonic Valve: The pulmonic valve was normal in structure. Pulmonic valve  regurgitation is mild. No evidence of pulmonic stenosis.   Aorta: Aortic dilatation noted. There is mild dilatation of the ascending  aorta, measuring 44 mm. There is mild dilatation of the aortic root,  measuring 45 mm.   Venous: The inferior vena cava is normal in size with greater than 50%  respiratory variability, suggesting right atrial pressure of 3 mmHg.   IAS/Shunts: No atrial level shunt detected by color flow Doppler.      Assessment & Plan   1.  Essential hypertension-BP today 133/75   Continue amlodipline 2.5 mg daily Continue heart healthy low-sodium diet Maintain blood pressure log-reviewed   Coronary artery disease-no anginal symptoms.  Noted to have three-vessel coronary atherosclerosis on chest CT. continues with baseline physical activity.  No anginal chest discomfort Continue aspirin , rosuvastatin  Maintain physical activity   Hyperlipidemia-LDL 55 on 04/05/23. High-fiber diet Continue current medical therapy Plan for repeat fasting lipids 8/25  Disposition: Follow-up with Dr. Audery Blazing or me in 6 months.   Chet Cota. Nestor Wieneke NP-C     01/24/2024, 12:57 PM [facility] [address] Office [phone] Fax [phone]    I spent 14*** minutes examining this patient, reviewing medications, and using patient centered shared decision making involving her cardiac care.   I spent greater than 20 minutes reviewing  her past medical history,  medications,  and prior cardiac tests.

## 2024-01-27 ENCOUNTER — Ambulatory Visit: Admitting: General Practice

## 2024-02-09 NOTE — Progress Notes (Signed)
 Cardiology Clinic Note   Patient Name: Curtis Jacobs Date of Encounter: 02/11/2024  Primary Care Provider:  Garald Karlynn GAILS, MD Primary Cardiologist:  Redell Shallow, MD  Patient Profile    Curtis Jacobs 89 year old male presents to the clinic today for follow-up evaluation of his coronary artery disease and hypertension.  Past Medical History    Past Medical History:  Diagnosis Date   BPH (benign prostatic hypertrophy)    Dr. Alline   CAD (coronary artery disease)    HA (headache)    Traction- Dr. Jenel- had MRI.MRA   HTN (hypertension)    Hyperlipidemia    Past Surgical History:  Procedure Laterality Date   EXCISIONAL HEMORRHOIDECTOMY     LUNG REMOVAL, PARTIAL Right     Allergies  Allergies  Allergen Reactions   Cordran  [Flurandrenolide ] Rash    rash   Lisinopril Cough    REACTION: cough    History of Present Illness    Curtis Jacobs has a PMH of coronary artery disease, HLD, HTN, and BPH.  Carotid Dopplers 12/20 showed no stenosis.  Chest CT 6/22 showed bronchiectasis, pulmonary nodules, and three-vessel coronary atherosclerosis with aortic atherosclerosis as well.  Echocardiogram 10/22 showed normal LV function, mild LVH, G1 DD, mild right ventricular enlargement, mild mitral regurgitation, mild aortic insufficiency, dilated ascending aorta measuring 44 mm and dilated aortic root measuring 45 mm.  Chest CT 12/22 showed previous right upper lobe resection, bronchiectasis and ectatic ascending aorta measuring 40 mm.  He was seen in follow-up by Dr. Shallow on 11/18/2021.  During that time he continued to have dyspnea on exertion which he attributed to his bronchiectasis.  He denied orthopnea and PND.  He denied lower extremity swelling, chest pain and syncope.  He contacted the nurse triage line and reported that he was not feeling well.  He noted that he also had elevated blood pressure.  He presented to the clinic 07/05/23 for follow-up evaluation and stated  he had noticed over the past several weeks he had  increased  blood pressure.  He noted at home that his blood pressure was routinely in the 140s over 80s.  In the clinic initially his bp was146/80 and on recheck was 142/80.  He continued to have back discomfort related to his lumbar stenosis.  This limited his physical activity.  He took Tylenol  for this.  We reviewed secondary causes of hypertension.  I started amlodipine  2.5 mg daily, gave salty 6 diet sheet and planned follow-up in 1-2 months.  He presented to the clinic 09/01/23 for follow-up evaluation and stated his blood pressure had been better controlled with amlodipine .  He remained very physically active working in his wood shop.  He continued to have back pain related to spinal stenosis.  He had been receiving steroid injections which had not provided much relief.  We briefly reviewed PRP and stem cell injections.  He reported that he would investigate these with his PCP.  I continued his current medication regimen, had him continue to monitor his blood pressure and planned follow-up in 6 months.  He presents to the clinic today for follow-up evaluation and states he was diagnosed with sepsis and had to spend several days in the hospital.  He has finished outpatient amoxicillin  and continues to take prednisone .  We reviewed his last clinic visit from January and recommendations for lipid lowering medication.  He reports that he had stopped taking his rosuvastatin  and was also not taking amlodipine .  He  notes labile blood pressures at home.  Today in the clinic initially his blood pressure is 167/81 and on recheck is 144/82.  I have instructed him to start taking amlodipine  2.5 mg daily for systolic blood pressure greater than 140.  He expressed understanding.  He notes that his energy is slowly returning after his sepsis.  I will plan follow-up in 4 to 6 months.  Today he denies chest pain, shortness of breath, lower extremity edema,, diaphoresis,  presyncope, syncope, orthopnea, and PND.   Home Medications    Prior to Admission medications   Medication Sig Start Date End Date Taking? Authorizing Provider  aspirin  EC 81 MG tablet Take 1 tablet (81 mg total) by mouth daily. Swallow whole. 11/18/21   Pietro Redell RAMAN, MD  cetirizine (ZYRTEC) 10 MG tablet Take 1 tablet by mouth daily.    [provider]  Cholecalciferol 1000 UNITS tablet Take 2,000 Units by mouth daily.    [provider]  Dapsone  7.5 % GEL Apply 1 application topically daily. 12/10/20   Plotnikov, Aleksei V, MD  fluocinonide cream (LIDEX) 0.05 % Apply 1 application topically daily. 04/30/16   [provider]  Glucosamine-Chondroit-Vit C-Mn (GLUCOSAMINE CHONDROITIN COMPLX) CAPS Take 1 capsule by mouth daily.    [provider]  guaiFENesin  (MUCINEX ) 600 MG 12 hr tablet Take 2 tablets (1,200 mg total) by mouth 2 (two) times daily. Patient taking differently: Take 1,200 mg by mouth daily. 08/05/15   McQuaid, Douglas B, MD  ipratropium (ATROVENT ) 0.03 % nasal spray Place 2 sprays into both nostrils every 12 (twelve) hours. 09/07/22   Hope Almarie ORN, NP  ipratropium-albuterol  (DUONEB) 0.5-2.5 (3) MG/3ML SOLN Take 3 mLs by nebulization every 6 (six) hours as needed. 12/10/20   Plotnikov, Aleksei V, MD  loratadine  (CLARITIN ) 10 MG tablet Take 10 mg by mouth daily.    [provider]  Multiple Vitamin (MULTIVITAMIN) tablet Take 1 tablet by mouth daily.    [provider]  mupirocin ointment (BACTROBAN) 2 %  09/27/19   [provider]  rosuvastatin  (CRESTOR ) 20 MG tablet TAKE 1 TABLET(20 MG) BY MOUTH DAILY 06/18/23   Pietro Redell RAMAN, MD  Saw Palmetto POWD Take by mouth daily.    [provider]  sodium chloride  HYPERTONIC 3 % nebulizer solution Take by nebulization as needed for other. Use this 2 times a day for cough. 09/03/21   Icard, Adine CROME, DO  terazosin  (HYTRIN ) 10 MG capsule TAKE 1 CAPSULE BY MOUTH EVERY  NIGHT AT BEDTIME. ANNUAL APPT DUE IN AUGUST. 02/26/23   Plotnikov, Aleksei V, MD  triamcinolone  ointment (KENALOG ) 0.1 % APPLY EXTERNALLY TO THE AFFECTED AREA THREE TIMES DAILY 12/08/19   Plotnikov, Aleksei V, MD  vitamin C (ASCORBIC ACID) 500 MG tablet Take 500 mg by mouth daily.    [provider]    Family History    Family History  Problem Relation Age of Onset   Heart disease Father    He indicated that his mother is deceased. He indicated that his father is deceased.  Social History    Social History   Socioeconomic History   Marital status: Married    Spouse name: Not on file   Number of children: Not on file   Years of education: Not on file   Highest education level: Not on file  Occupational History   Not on file  Tobacco Use   Smoking status: Former    Current packs/day: 0.00    Average  packs/day: 0.1 packs/day for 3.0 years (0.3 ttl pk-yrs)    Types: Cigarettes    Start date: 07/25/1949    Quit date: 07/25/1952    Years since quitting: 71.5   Smokeless tobacco: Never   Tobacco comments:    used to smoke but not inhale  Substance and Sexual Activity   Alcohol  use: No   Drug use: No   Sexual activity: Yes  Other Topics Concern   Not on file  Social History Narrative   Not on file   Social Drivers of Health   Financial Resource Strain: Low Risk  (04/05/2023)   Overall Financial Resource Strain (CARDIA)    Difficulty of Paying Living Expenses: Not hard at all  Food Insecurity: No Food Insecurity (11/08/2023)   Hunger Vital Sign    Worried About Running Out of Food in the Last Year: Never true    Ran Out of Food in the Last Year: Never true  Transportation Needs: No Transportation Needs (11/08/2023)   PRAPARE - Administrator, Civil Service (Medical): No    Lack of Transportation (Non-Medical): No  Physical Activity: Sufficiently Active (04/05/2023)   Exercise Vital Sign    Days of Exercise per Week: 5 days    Minutes of Exercise per  Session: 30 min  Stress: No Stress Concern Present (04/05/2023)   [employer] of Occupational Health - Occupational Stress Questionnaire    Feeling of Stress : Not at all  Social Connections: Socially Integrated (10/31/2023)   Social Connection and Isolation Panel    Frequency of Communication with Friends and Family: More than three times a week    Frequency of Social Gatherings with Friends and Family: Once a week    Attends Religious Services: More than 4 times per year    Active Member of [employer] or Organizations: No    Attends Engineer, structural: More than 4 times per year    Marital Status: Married  Catering manager Violence: Not At Risk (11/08/2023)   Humiliation, Afraid, Rape, and Kick questionnaire    Fear of Current or Ex-Partner: No    Emotionally Abused: No    Physically Abused: No    Sexually Abused: No     Review of Systems    General:  No chills, fever, night sweats or weight changes.  Cardiovascular:  No chest pain, dyspnea on exertion, edema, orthopnea, palpitations, paroxysmal nocturnal dyspnea. Dermatological: No rash, lesions/masses Respiratory: No cough, dyspnea Urologic: No hematuria, dysuria Abdominal:   No nausea, vomiting, diarrhea, bright red blood per rectum, melena, or hematemesis Neurologic:  No visual changes, wkns, changes in mental status. All other systems reviewed and are otherwise negative except as noted above.  Physical Exam    VS:  BP (!) 144/82   Pulse 79   Ht 5' 10 (1.778 m)   Wt 190 lb (86.2 kg)   SpO2 96%   BMI 27.26 kg/m  , BMI Body mass index is 27.26 kg/m. GEN: Well nourished, well developed, in no acute distress. HEENT: normal. Neck: Supple, no JVD, carotid bruits, or masses. Cardiac: RRR, no murmurs, rubs, or gallops. No clubbing, cyanosis, edema.  Radials/DP/PT 2+ and equal bilaterally.  Respiratory:  Respirations regular and unlabored, clear to auscultation bilaterally. GI: Soft, nontender, nondistended, BS +  x 4. MS: no deformity or atrophy. Skin: warm and dry, no rash. Neuro:  Strength and sensation are intact. Psych: Normal affect.  Accessory Clinical Findings    Recent Labs: 11/03/2023: B Natriuretic Peptide  287.6 11/05/2023: Magnesium 2.0 12/13/2023: ALT 12; BUN 17; Creatinine, Ser 1.47; Potassium 4.2; Pro B Natriuretic peptide (BNP) 94.0; Sodium 141; TSH 2.95 01/03/2024: Hemoglobin 11.2; Platelets 85.0   Recent Lipid Panel    Component Value Date/Time   CHOL 129 11/22/2023 1227   CHOL 142 07/15/2021 0940   TRIG 137.0 11/22/2023 1227   HDL 36.20 (L) 11/22/2023 1227   HDL 42 07/15/2021 0940   CHOLHDL 4 11/22/2023 1227   VLDL 27.4 11/22/2023 1227   LDLCALC 66 11/22/2023 1227   LDLCALC 74 07/15/2021 0940   LDLDIRECT 101.0 01/10/2015 1117       ECG personally reviewed by me today-none today.    Echocardiogram 06/06/2021  IMPRESSIONS     1. Left ventricular ejection fraction by 3D volume is 65 %. The left  ventricle has normal function. The left ventricle has no regional wall  motion abnormalities. The left ventricular internal cavity size was mildly  dilated. Left ventricular diastolic  parameters are consistent with Grade I diastolic dysfunction (impaired  relaxation).   2. Right ventricular systolic function is normal. The right ventricular  size is mildly enlarged.   3. The mitral valve is normal in structure. Mild mitral valve  regurgitation. No evidence of mitral stenosis.   4. The aortic valve is tricuspid. Aortic valve regurgitation is mild. No  aortic stenosis is present.   5. Aortic dilatation noted. There is mild dilatation of the ascending  aorta, measuring 44 mm. There is mild dilatation of the aortic root,  measuring 45 mm.   6. The inferior vena cava is normal in size with greater than 50%  respiratory variability, suggesting right atrial pressure of 3 mmHg.   FINDINGS   Left Ventricle: Left ventricular ejection fraction by 3D volume is 65 %.  The  left ventricle has normal function. The left ventricle has no regional  wall motion abnormalities. The left ventricular internal cavity size was  mildly dilated. There is no left   ventricular hypertrophy. Left ventricular diastolic parameters are  consistent with Grade I diastolic dysfunction (impaired relaxation).   Right Ventricle: The right ventricular size is mildly enlarged. No  increase in right ventricular wall thickness. Right ventricular systolic  function is normal.   Left Atrium: Left atrial size was normal in size.   Right Atrium: Right atrial size was normal in size.   Pericardium: There is no evidence of pericardial effusion.   Mitral Valve: The mitral valve is normal in structure. Mild mitral valve  regurgitation. No evidence of mitral valve stenosis.   Tricuspid Valve: The tricuspid valve is normal in structure. Tricuspid  valve regurgitation is not demonstrated. No evidence of tricuspid  stenosis.   Aortic Valve: The aortic valve is tricuspid. Aortic valve regurgitation is  mild. Aortic regurgitation PHT measures 673 msec. No aortic stenosis is  present.   Pulmonic Valve: The pulmonic valve was normal in structure. Pulmonic valve  regurgitation is mild. No evidence of pulmonic stenosis.   Aorta: Aortic dilatation noted. There is mild dilatation of the ascending  aorta, measuring 44 mm. There is mild dilatation of the aortic root,  measuring 45 mm.   Venous: The inferior vena cava is normal in size with greater than 50%  respiratory variability, suggesting right atrial pressure of 3 mmHg.   IAS/Shunts: No atrial level shunt detected by color flow Doppler.      Assessment & Plan   1.  Coronary artery disease-denies exertional chest discomfort.  Previously noted to have three-vessel  coronary atherosclerosis on chest CT. slowly increasing physical activity after having sepsis.   Continue aspirin ,  Restart rosuvastatin  Maintain physical activity No plans  for ischemic evaluation  Essential hypertension-BP today 144/82 Continue amlodipline  Continue heart healthy low-sodium diet-reviewed Continue to maintain blood pressure log  Hyperlipidemia-LDL 55 on 04/05/23. High-fiber diet Contiune ASA Restart rosuvastatin  Will order fasting lipids after 8/25  Disposition: Follow-up with Dr. Pietro or me in 4-6 months.   Curtis Jacobs. Kaiyana Bedore NP-C     02/11/2024, 11:20 AM [facility] [address] Office [phone] Fax [phone]    I spent 14 minutes examining this patient, reviewing medications, and using patient centered shared decision making involving her cardiac care.   I spent greater than 20 minutes reviewing her past medical history,  medications, and prior cardiac tests.

## 2024-02-11 ENCOUNTER — Ambulatory Visit: Attending: General Practice | Admitting: General Practice

## 2024-02-11 ENCOUNTER — Encounter: Payer: Self-pay | Admitting: General Practice

## 2024-02-11 VITALS — BP 144/82 | HR 79 | Ht 70.0 in | Wt 190.0 lb

## 2024-02-11 DIAGNOSIS — E7800 Pure hypercholesterolemia, unspecified: Secondary | ICD-10-CM

## 2024-02-11 DIAGNOSIS — I2510 Atherosclerotic heart disease of native coronary artery without angina pectoris: Secondary | ICD-10-CM

## 2024-02-11 DIAGNOSIS — I10 Essential (primary) hypertension: Secondary | ICD-10-CM | POA: Diagnosis not present

## 2024-02-11 MED ORDER — ROSUVASTATIN CALCIUM 20 MG PO TABS: 20.0000 mg | ORAL_TABLET | Freq: Every day | ORAL | 3 refills | Status: AC

## 2024-02-11 MED ORDER — AMLODIPINE BESYLATE 2.5 MG PO TABS: 2.5000 mg | ORAL_TABLET | Freq: Every day | ORAL | 3 refills | Status: AC

## 2024-02-11 NOTE — Patient Instructions (Signed)
 Medication Instructions:  NO CHANGES *If you need a refill on your cardiac medications before your next appointment, please call your pharmacy*  Lab Work: NO LABS If you have labs (blood work) drawn today and your tests are completely normal, you will receive your results only by: MyChart Message (if you have MyChart) OR A paper copy in the mail If you have any lab test that is abnormal or we need to change your treatment, we will call you to review the results.  Testing/Procedures: NO TESTING  Follow-Up: At [facility], you and your health needs are our priority.  As part of our continuing mission to provide you with exceptional heart care, our providers are all part of one team.  This team includes your primary Cardiologist (physician) and Advanced Practice Providers or APPs (Physician Assistants and Nurse Practitioners) who all work together to provide you with the care you need, when you need it.  Your next appointment:   4-6 month(s)  Provider:   Redell Shallow, MD or Josefa Beauvais, NP   Other Instructions IF BLOOD PRESSURE REMAINS AT 140 OR GREATER START TAKING AMLODIPINE  AND ROSUVASTATIN .

## 2024-02-15 ENCOUNTER — Ambulatory Visit: Payer: Self-pay

## 2024-02-15 NOTE — Telephone Encounter (Signed)
 FYI Only or Action Required?: FYI only for provider.  Patient was last seen in primary care on 01/18/2024 by Plotnikov, Karlynn GAILS, MD. Called Nurse Triage reporting Urinary Retention. Symptoms began x 2 weeks ago. Interventions attempted: Nothing. Symptoms are: unchanged.  Triage Disposition: See PCP Within 2 Weeks, See Physician Within 24 Hours  Patient/caregiver understands and will follow disposition?: Yes  ** Patient wishes to see PCP, he is scheduled for 7/8**                       Copied from CRM [id number]. Topic: Clinical - Red Word Triage >> Feb 15, 2024 10:33 AM Suzen RAMAN wrote: Red Word that prompted transfer to Nurse Triage: Inability to urine Reason for Disposition  Urinating more frequently than usual (i.e., frequency)  Dribbling (losing urine) just after finishing urination (i.e., post-void dribbling)  Answer Assessment - Initial Assessment Questions 1. SYMPTOM: What's the main symptom you're concerned about? (e.g., frequency, incontinence)      urge to urinate/ frequency  2. ONSET: When did the symptoms  start?       His last urination was 5 minutes ago, he urinates as normal but states the urine dribbles  3. PAIN: Is there any pain? If Yes, ask: How bad is it? (Scale: 1-10; mild, moderate, severe)     No  4. CAUSE: What do you think is causing the symptoms?    Prescription medication changes   5. OTHER SYMPTOMS: Do you have any other symptoms? (e.g., blood in urine, fever, flank pain, pain with urination)  No     PCP discontinued terazosin  and Crestor ., he states he was urinating fine while on the medication, since 6/3 he has had frequency/ urge to urinate This has been ongoing x 2 weeks.He states he feels the need to go, then mid stream it stops.  Protocols used: Urinary Symptoms-A-AH

## 2024-02-17 ENCOUNTER — Other Ambulatory Visit: Payer: Self-pay | Admitting: Internal Medicine

## 2024-02-17 MED ORDER — TAMSULOSIN HCL 0.4 MG PO CAPS
0.4000 mg | ORAL_CAPSULE | Freq: Every day | ORAL | 11 refills | Status: DC
Start: 2024-02-17 — End: 2024-02-22

## 2024-02-17 NOTE — Addendum Note (Signed)
 Addended by: Hiral Lukasiewicz V on: 02/17/2024 07:50 AM   Modules accepted: Orders

## 2024-02-17 NOTE — Telephone Encounter (Signed)
Message left for patient today with instructions.

## 2024-02-17 NOTE — Telephone Encounter (Signed)
 Do not restart Hytrin .  Start Flomax  -a new prescription was sent in.  Stay off Crestor  for now.  Thanks

## 2024-02-22 ENCOUNTER — Encounter: Payer: Self-pay | Admitting: Internal Medicine

## 2024-02-22 ENCOUNTER — Ambulatory Visit (INDEPENDENT_AMBULATORY_CARE_PROVIDER_SITE_OTHER): Admitting: Internal Medicine

## 2024-02-22 VITALS — BP 130/78 | HR 70 | Temp 98.0°F | Ht 70.0 in | Wt 190.4 lb

## 2024-02-22 DIAGNOSIS — J479 Bronchiectasis, uncomplicated: Secondary | ICD-10-CM

## 2024-02-22 DIAGNOSIS — R0609 Other forms of dyspnea: Secondary | ICD-10-CM

## 2024-02-22 DIAGNOSIS — N62 Hypertrophy of breast: Secondary | ICD-10-CM | POA: Insufficient documentation

## 2024-02-22 DIAGNOSIS — I10 Essential (primary) hypertension: Secondary | ICD-10-CM

## 2024-02-22 DIAGNOSIS — R531 Weakness: Secondary | ICD-10-CM | POA: Diagnosis not present

## 2024-02-22 MED ORDER — TAMSULOSIN HCL 0.4 MG PO CAPS: 0.4000 mg | ORAL_CAPSULE | Freq: Every day | ORAL | 11 refills | Status: AC

## 2024-02-22 MED ORDER — PREDNISONE 10 MG PO TABS: ORAL_TABLET | ORAL | 2 refills | Status: AC

## 2024-02-22 NOTE — Assessment & Plan Note (Signed)
 L breast - ?steroids or age related Will sch mammogram

## 2024-02-22 NOTE — Progress Notes (Signed)
 Subjective:  Patient ID: Curtis Jacobs, male    DOB: November 01, 1932  Age: 89 y.o. MRN: [id number]  CC: Medical Management of Chronic Issues (Discuss urinary issues a/w other health concerns )   HPI KODAH MARET presents for urinary problems: hard to pee now   Wife fell on the floor, hit her head and went to ER - stress  F/u SOB, fatigue - better on Prednisone , low dose  C/o L breast lump  Outpatient Medications Prior to Visit  Medication Sig Dispense Refill   albuterol  (VENTOLIN  HFA) 108 (90 Base) MCG/ACT inhaler Inhale 2 puffs into the lungs every 6 (six) hours as needed for wheezing or shortness of breath. 8 g 5   amLODipine  (NORVASC ) 2.5 MG tablet Take 1 tablet (2.5 mg total) by mouth daily. 90 tablet 3   aspirin  EC 81 MG tablet Take 1 tablet (81 mg total) by mouth daily. Swallow whole. 90 tablet 3   cetirizine (ZYRTEC) 10 MG tablet Take 1 tablet by mouth daily.     ferrous sulfate  324 MG TBEC Take 324 mg by mouth. Nature Made     finasteride  (PROSCAR ) 5 MG tablet Take 5 mg by mouth daily.     fluocinonide cream (LIDEX) 0.05 % Apply 1 application topically daily.     guaiFENesin  (MUCINEX ) 600 MG 12 hr tablet Take 1 tablet (600 mg total) by mouth 2 (two) times daily. 60 tablet 0   Multiple Vitamin (MULTIVITAMIN) tablet Take 1 tablet by mouth daily.     rosuvastatin  (CRESTOR ) 20 MG tablet Take 1 tablet (20 mg total) by mouth daily. 90 tablet 3   predniSONE  (DELTASONE ) 10 MG tablet Prednisone  10 mg a day. Take pc. 30 tablet 1   tamsulosin  (FLOMAX ) 0.4 MG CAPS capsule Take 1 capsule (0.4 mg total) by mouth daily. 30 capsule 11   No facility-administered medications prior to visit.    ROS: Review of Systems  Constitutional:  Positive for fatigue. Negative for appetite change and unexpected weight change.  HENT:  Negative for congestion, nosebleeds, sneezing, sore throat, trouble swallowing and voice change.   Eyes:  Negative for itching and visual disturbance.  Respiratory:   Positive for shortness of breath. Negative for cough and wheezing.   Cardiovascular:  Negative for chest pain, palpitations and leg swelling.  Gastrointestinal:  Negative for abdominal distention, blood in stool, diarrhea and nausea.  Genitourinary:  Positive for difficulty urinating and frequency. Negative for dysuria and hematuria.  Musculoskeletal:  Positive for back pain and gait problem. Negative for joint swelling and neck pain.  Skin:  Negative for rash.  Neurological:  Positive for weakness. Negative for dizziness, tremors and speech difficulty.  Psychiatric/Behavioral:  Negative for agitation, dysphoric mood and sleep disturbance. The patient is not nervous/anxious.     Objective:  BP 130/78   Pulse 70   Temp 98 F (36.7 C) (Oral)   Ht 5' 10 (1.778 m)   Wt 190 lb 6.4 oz (86.4 kg)   SpO2 97%   BMI 27.32 kg/m   BP Readings from Last 3 Encounters:  02/22/24 130/78  02/11/24 (!) 144/82  01/18/24 120/70    Wt Readings from Last 3 Encounters:  02/22/24 190 lb 6.4 oz (86.4 kg)  02/11/24 190 lb (86.2 kg)  01/18/24 193 lb (87.5 kg)    Physical Exam Constitutional:      General: He is not in acute distress.    Appearance: Normal appearance. He is well-developed.     Comments: NAD  Eyes:     Conjunctiva/sclera: Conjunctivae normal.     Pupils: Pupils are equal, round, and reactive to light.  Neck:     Thyroid : No thyromegaly.     Vascular: No JVD.  Cardiovascular:     Rate and Rhythm: Normal rate and regular rhythm.     Heart sounds: Normal heart sounds. No murmur heard.    No friction rub. No gallop.  Pulmonary:     Effort: Pulmonary effort is normal. No respiratory distress.     Breath sounds: Normal breath sounds. No wheezing or rales.  Chest:     Chest wall: No tenderness.  Abdominal:     General: Bowel sounds are normal. There is no distension.     Palpations: Abdomen is soft. There is no mass.     Tenderness: There is no abdominal tenderness. There is no  guarding or rebound.  Musculoskeletal:        General: Tenderness present. Normal range of motion.     Cervical back: Normal range of motion.     Right lower leg: No edema.     Left lower leg: No edema.  Lymphadenopathy:     Cervical: No cervical adenopathy.  Skin:    General: Skin is warm and dry.     Findings: No rash.  Neurological:     Mental Status: He is alert and oriented to person, place, and time.     Cranial Nerves: No cranial nerve deficit.     Motor: No abnormal muscle tone.     Coordination: Coordination normal.     Gait: Gait normal.     Deep Tendon Reflexes: Reflexes are normal and symmetric.  Psychiatric:        Behavior: Behavior normal.        Thought Content: Thought content normal.        Judgment: Judgment normal.   Slight DOE Gynecomastia L, small lump  Lab Results  Component Value Date   WBC 3.8 (L) 01/03/2024   HGB 11.2 (L) 01/03/2024   HCT 33.1 (L) 01/03/2024   PLT 85.0 (L) 01/03/2024   GLUCOSE 115 (H) 12/13/2023   CHOL 129 11/22/2023   TRIG 137.0 11/22/2023   HDL 36.20 (L) 11/22/2023   LDLDIRECT 101.0 01/10/2015   LDLCALC 66 11/22/2023   ALT 12 12/13/2023   AST 17 12/13/2023   NA 141 12/13/2023   K 4.2 12/13/2023   CL 108 12/13/2023   CREATININE 1.47 12/13/2023   BUN 17 12/13/2023   CO2 26 12/13/2023   TSH 2.95 12/13/2023   PSA 17.10 (H) 11/22/2023   HGBA1C 5.8 02/02/2019    CT Angio Chest Pulmonary Embolism (PE) W or WO Contrast Result Date: 01/04/2024 CLINICAL DATA:  Pulmonary embolism (PE) suspected, high prob Dyspnea on exertion, elevated D-dimer. Dyspnea on exertion. Generalized weakness. Shortness of breath. EXAM: CT ANGIOGRAPHY CHEST WITH CONTRAST TECHNIQUE: Multidetector CT imaging of the chest was performed using the standard protocol during bolus administration of intravenous contrast. Multiplanar CT image reconstructions and MIPs were obtained to evaluate the vascular anatomy. RADIATION DOSE REDUCTION: This exam was performed  according to the departmental dose-optimization program which includes automated exposure control, adjustment of the mA and/or kV according to patient size and/or use of iterative reconstruction technique. CONTRAST:  75mL ISOVUE -370 IOPAMIDOL  (ISOVUE -370) INJECTION 76% COMPARISON:  CT scan chest from 08/08/2021. FINDINGS: Cardiovascular: No evidence of embolism to the proximal subsegmental pulmonary artery level. Normal cardiac size. No pericardial effusion. Redemonstration of dilation of ascending thoracic aorta  measuring up to 4.1 x 4.2 cm on this non cardiac gated exam. Follow-up examination is recommended with CTA chest with cardiac gating for accurate measurement and device follow-up strategy. There are coronary artery calcifications, in keeping with coronary artery disease. There are also moderate peripheral atherosclerotic vascular calcifications of thoracic aorta and its major branches. Mediastinum/Nodes: Visualized thyroid  gland appears grossly unremarkable. No solid / cystic mediastinal masses. The esophagus is nondistended precluding optimal assessment. There are few mildly prominent mediastinal and hilar lymph nodes, which do not meet the size criteria for lymphadenopathy and appear grossly similar to the prior study, favoring benign etiology. No axillary lymphadenopathy by size criteria. Lungs/Pleura: The central tracheo-bronchial tree is patent. Redemonstration of postsurgical changes from prior right upper lobe wedge resection. There are stable areas of fibrosis/scarring in the right lung. Redemonstration of biapical pleuroparenchymal disease, right more than left. Redemonstration of patchy areas of bronchiectasis throughout bilateral lungs, essentially unchanged. There also associated scattered nonspecific pulmonary nodules throughout bilateral lungs. 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 No new mass or consolidation. No pleural effusion or  pneumothorax. No new suspicious lung nodule seen. Stable calcified granuloma in the left lung upper lobe as well as adjacent additional irregular soft tissue nodule (series 7, images 56 and 49). Upper Abdomen: Small volume calcified gallstones noted without imaging signs of acute cholecystitis. There is small sliding hiatal hernia. Remaining visualized upper abdominal viscera within normal limits. Musculoskeletal: The visualized soft tissues of the chest wall are grossly unremarkable. No suspicious osseous lesions. There are mild multilevel degenerative changes in the visualized spine. Redemonstration of postsurgical changes in the right posteromedial sixth rib. Review of the MIP images confirms the above findings. IMPRESSION: 1. No embolism to the proximal subsegmental pulmonary artery level. 2. Redemonstration of dilation of ascending thoracic aorta measuring up to 4.1 x 4.2 cm on this non cardiac gated exam. Follow-up examination is recommended with CTA chest with cardiac gating for accurate measurement and to device a follow-up strategy. 3. Redemonstration of postsurgical changes from prior right upper lobe wedge resection. There are stable areas of fibrosis/scarring in the right lung. Redemonstration of patchy areas of bronchiectasis throughout bilateral lungs, essentially unchanged. There are also associated scattered nonspecific pulmonary nodules throughout bilateral lungs. 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 No new mass or consolidation. No pleural effusion or pneumothorax. No new suspicious lung nodule seen. 4. Multiple other nonacute observations, as described above. Aortic Atherosclerosis (ICD10-I70.0). Electronically Signed   By: Ree Molt M.D.   On: 01/04/2024 11:53    Assessment & Plan:   Problem List Items Addressed This Visit     Essential hypertension   Not better Hold Terazosin       Bronchiectasis without acute exacerbation (HCC)    SOB, fatigue - better on Prednisone , low dose  Potential benefits of a long term steroid  use as well as potential risks  and complications were explained to the patient and were aknowledged.       Weakness   SOB, fatigue - better on Prednisone , low dose  Potential benefits of a long term steroid  use as well as potential risks  and complications were explained to the patient and were aknowledged.      DOE (dyspnea on exertion)   SOB, fatigue - better on Prednisone , low dose  Potential benefits of a long term steroid  use as well as potential risks  and complications were explained to the patient and  were aknowledged.      Gynecomastia, male - Primary   L breast - ?steroids or age related Will sch mammogram      Relevant Orders   MM Digital Diagnostic Bilat      Meds ordered this encounter  Medications   tamsulosin  (FLOMAX ) 0.4 MG CAPS capsule    Sig: Take 1 capsule (0.4 mg total) by mouth daily.    Dispense:  30 capsule    Refill:  11   predniSONE  (DELTASONE ) 10 MG tablet    Sig: Prednisone  10 mg a day. Take pc.    Dispense:  30 tablet    Refill:  2      Follow-up: Return in about 6 weeks (around 04/04/2024) for a follow-up visit.  Marolyn Noel, MD

## 2024-02-22 NOTE — Assessment & Plan Note (Signed)
 Not better Hold Terazosin 

## 2024-02-22 NOTE — Assessment & Plan Note (Signed)
 SOB, fatigue - better on Prednisone , low dose  Potential benefits of a long term steroid  use as well as potential risks  and complications were explained to the patient and were aknowledged.

## 2024-02-24 ENCOUNTER — Other Ambulatory Visit: Payer: Self-pay | Admitting: Internal Medicine

## 2024-02-24 DIAGNOSIS — N62 Hypertrophy of breast: Secondary | ICD-10-CM

## 2024-03-08 ENCOUNTER — Other Ambulatory Visit: Payer: Self-pay | Admitting: Internal Medicine

## 2024-03-16 ENCOUNTER — Ambulatory Visit

## 2024-03-16 VITALS — Ht 70.0 in | Wt 190.0 lb

## 2024-03-16 DIAGNOSIS — Z0000 Encounter for general adult medical examination without abnormal findings: Secondary | ICD-10-CM

## 2024-03-16 NOTE — Patient Instructions (Signed)
 Mr. Aungst , Thank you for taking time out of your busy schedule to complete your Annual Wellness Visit with me. I enjoyed our conversation and look forward to speaking with you again next year. I, as well as your care team,  appreciate your ongoing commitment to your health goals. Please review the following plan we discussed and let me know if I can assist you in the future. Your Game plan/ To Do List    Referrals: If you haven't heard from the office you've been referred to, please reach out to them at the phone provided.   Follow up Visits: We will see or speak with you next year for your Next Medicare AWV with our clinical staff Have you seen your provider in the last 6 months (3 months if uncontrolled diabetes)? Yes  Clinician Recommendations:  Aim for 30 minutes of exercise or brisk walking, 6-8 glasses of water, and 5 servings of fruits and vegetables each day.       This is a list of the screenings recommended for you:  Health Maintenance  Topic Date Due   COVID-19 Vaccine (4 - 2024-25 season) 04/18/2023   Flu Shot  03/17/2024   Medicare Annual Wellness Visit  03/16/2025   DTaP/Tdap/Td vaccine (3 - Td or Tdap) 11/03/2025   Pneumococcal Vaccine for age over 44  Completed   Zoster (Shingles) Vaccine  Completed   Hepatitis B Vaccine  Aged Out   HPV Vaccine  Aged Out   Meningitis B Vaccine  Aged Out    Advanced directives: (Copy Requested) Please bring a copy of your health care power of attorney and living will to the office to be added to your chart at your convenience. You can mail to [facility] [address] [place], [place] [address] or email to [email or web address] Advance Care Planning is important because it:  [x]  Makes sure you receive the medical care that is consistent with your values, goals, and preferences  [x]  It provides guidance to your family and loved ones and reduces their decisional burden about whether or not they are making the  right decisions based on your wishes.  Follow the link provided in your after visit summary or read over the paperwork we have mailed to you to help you started getting your Advance Directives in place. If you need assistance in completing these, please reach out to us  so that we can help you!

## 2024-03-16 NOTE — Progress Notes (Cosign Needed)
 Subjective:   Curtis Jacobs is a 89 y.o. who presents for a Medicare Wellness preventive visit.  As a reminder, Annual Wellness Visits don't include a physical exam, and some assessments may be limited, especially if this visit is performed virtually. We may recommend an in-person follow-up visit with your provider if needed.  Visit Complete: Virtual I connected with  Curtis Jacobs on 03/16/24 by a audio enabled telemedicine application and verified that I am speaking with the correct person using two identifiers.  Patient Location: Home  Provider Location: Office/Clinic  I discussed the limitations of evaluation and management by telemedicine. The patient expressed understanding and agreed to proceed.  Vital Signs: Because this visit was a virtual/telehealth visit, some criteria may be missing or patient reported. Any vitals not documented were not able to be obtained and vitals that have been documented are patient reported.  VideoDeclined- This patient declined Librarian, academic. Therefore the visit was completed with audio only.  Persons Participating in Visit: Patient.  AWV Questionnaire: No: Patient Medicare AWV questionnaire was not completed prior to this visit.  Cardiac Risk Factors include: advanced age (>61men, >1 women);dyslipidemia;hypertension;male gender     Objective:    Today's Vitals   03/16/24 1425  Weight: 190 lb (86.2 kg)  Height: 5' 10 (1.778 m)   Body mass index is 27.26 kg/m.     03/16/2024    2:37 PM 10/31/2023    1:00 PM 10/31/2023    8:45 AM 04/05/2023    2:42 PM 05/27/2020   10:54 AM  Advanced Directives  Does Patient Have a Medical Advance Directive? Yes No No Yes Yes  Type of Estate agent of [place];Living will   Healthcare Power of [place];Living will Living will;Healthcare Power of Attorney  Copy of Healthcare Power of Attorney in Chart? No - copy requested   No - copy requested No - copy  requested  Would patient like information on creating a medical advance directive?  No - Patient declined No - Patient declined      Current Medications (verified) Outpatient Encounter Medications as of 03/16/2024  Medication Sig   albuterol  (VENTOLIN  HFA) 108 (90 Base) MCG/ACT inhaler Inhale 2 puffs into the lungs every 6 (six) hours as needed for wheezing or shortness of breath.   amLODipine  (NORVASC ) 2.5 MG tablet Take 1 tablet (2.5 mg total) by mouth daily.   aspirin  EC 81 MG tablet Take 1 tablet (81 mg total) by mouth daily. Swallow whole.   cetirizine (ZYRTEC) 10 MG tablet Take 1 tablet by mouth daily.   ferrous sulfate  324 MG TBEC Take 324 mg by mouth. Nature Made   finasteride  (PROSCAR ) 5 MG tablet Take 5 mg by mouth daily.   fluocinonide cream (LIDEX) 0.05 % Apply 1 application topically daily.   guaiFENesin  (MUCINEX ) 600 MG 12 hr tablet Take 1 tablet (600 mg total) by mouth 2 (two) times daily.   Multiple Vitamin (MULTIVITAMIN) tablet Take 1 tablet by mouth daily.   predniSONE  (DELTASONE ) 10 MG tablet Prednisone  10 mg a day. Take pc.   rosuvastatin  (CRESTOR ) 20 MG tablet Take 1 tablet (20 mg total) by mouth daily.   tamsulosin  (FLOMAX ) 0.4 MG CAPS capsule Take 1 capsule (0.4 mg total) by mouth daily.   No facility-administered encounter medications on file as of 03/16/2024.    Allergies (verified) Cordran  [flurandrenolide ] and Lisinopril   History: Past Medical History:  Diagnosis Date   BPH (benign prostatic hypertrophy)  Dr. Alline   CAD (coronary artery disease)    HA (headache)    Traction- Dr. Jenel- had MRI.MRA   HTN (hypertension)    Hyperlipidemia    Past Surgical History:  Procedure Laterality Date   EXCISIONAL HEMORRHOIDECTOMY     LUNG REMOVAL, PARTIAL Right    Family History  Problem Relation Age of Onset   Heart disease Father    Social History   Socioeconomic History   Marital status: Married    Spouse name: Not on file   Number of children:  Not on file   Years of education: Not on file   Highest education level: Not on file  Occupational History   Not on file  Tobacco Use   Smoking status: Former    Current packs/day: 0.00    Average packs/day: 0.1 packs/day for 3.0 years (0.3 ttl pk-yrs)    Types: Cigarettes    Start date: 07/25/1949    Quit date: 07/25/1952    Years since quitting: 71.6   Smokeless tobacco: Never   Tobacco comments:    used to smoke but not inhale  Substance and Sexual Activity   Alcohol  use: No   Drug use: No   Sexual activity: Yes  Other Topics Concern   Not on file  Social History Narrative   Married   Social Drivers of Health   Financial Resource Strain: Low Risk  (03/16/2024)   Overall Financial Resource Strain (CARDIA)    Difficulty of Paying Living Expenses: Not hard at all  Food Insecurity: No Food Insecurity (03/16/2024)   Hunger Vital Sign    Worried About Running Out of Food in the Last Year: Never true    Ran Out of Food in the Last Year: Never true  Transportation Needs: No Transportation Needs (03/16/2024)   PRAPARE - Administrator, Civil Service (Medical): No    Lack of Transportation (Non-Medical): No  Physical Activity: Sufficiently Active (03/16/2024)   Exercise Vital Sign    Days of Exercise per Week: 5 days    Minutes of Exercise per Session: 30 min  Stress: No Stress Concern Present (03/16/2024)   [employer] of Occupational Health - Occupational Stress Questionnaire    Feeling of Stress: Not at all  Social Connections: Socially Integrated (03/16/2024)   Social Connection and Isolation Panel    Frequency of Communication with Friends and Family: More than three times a week    Frequency of Social Gatherings with Friends and Family: Once a week    Attends Religious Services: More than 4 times per year    Active Member of [employer] or Organizations: No    Attends Engineer, structural: More than 4 times per year    Marital Status: Married     Tobacco Counseling Counseling given: No Tobacco comments: used to smoke but not inhale    Clinical Intake:  Pre-visit preparation completed: Yes  Pain : No/denies pain     BMI - recorded: 27.26 Nutritional Status: BMI 25 -29 Overweight Nutritional Risks: None Diabetes: No  Lab Results  Component Value Date   HGBA1C 5.8 02/02/2019     How often do you need to have someone help you when you read instructions, pamphlets, or other written materials from your doctor or pharmacy?: 1 - Never  Interpreter Needed?: No  Information entered by :: Verdie Saba, CMA   Activities of Daily Living     03/16/2024    2:30 PM 10/31/2023    1:00 PM  In your present state of health, do you have any difficulty performing the following activities:  Hearing? 0 1  Comment wears hearing aids   Vision? 0 0  Difficulty concentrating or making decisions? 0 1  Walking or climbing stairs? 0   Dressing or bathing? 0   Doing errands, shopping? 0 1  Preparing Food and eating ? N   Using the Toilet? N   In the past six months, have you accidently leaked urine? N   Do you have problems with loss of bowel control? N   Managing your Medications? N   Managing your Finances? N   Housekeeping or managing your Housekeeping? N     Patient Care Team: Plotnikov, Karlynn GAILS, MD as PCP - General Pietro, Redell RAMAN, MD as PCP - Cardiology (Cardiology) Alaine Vicenta NOVAK, MD as Consulting Physician (Pulmonary Disease) Charmayne Molly, MD as Consulting Physician (Ophthalmology) Shane Steffan BROCKS, MD as Consulting Physician (Urology) Hope Almarie ORN, NP as Nurse Practitioner (Pulmonary Disease)  I have updated your Care Teams any recent Medical Services you may have received from other providers in the past year.     Assessment:   This is a routine wellness examination for Alann.  Hearing/Vision screen Hearing Screening - Comments:: Denies hearing difficulties   Vision Screening - Comments::  Wears rx glasses - up to date with routine eye exams with Dr Charmayne   Goals Addressed               This Visit's Progress     Patient Stated (pt-stated)        Patient stated he plans to continue staying alive and active and work on his diet       Depression Screen     03/16/2024    2:31 PM 01/03/2024    1:18 PM 12/08/2023   11:45 AM 06/21/2023    1:50 PM 04/05/2023    2:29 PM 09/21/2022    4:09 PM 05/27/2020   10:53 AM  PHQ 2/9 Scores  PHQ - 2 Score 0 0 0 0 0 0 0  PHQ- 9 Score 0    1      Fall Risk     03/16/2024    2:31 PM 01/03/2024    1:18 PM 12/01/2023   10:45 AM 10/07/2023   11:00 AM 06/21/2023    1:49 PM  Fall Risk   Falls in the past year? 0 0 0 0 0  Number falls in past yr: 0 0  0 0  Injury with Fall? 0 0  0 0  Risk for fall due to : No Fall Risks;Impaired balance/gait No Fall Risks Other (Comment);Impaired mobility No Fall Risks No Fall Risks  Risk for fall due to: Comment   lightheadedness    Follow up Falls evaluation completed;Falls prevention discussed Falls evaluation completed Education provided;Falls prevention discussed Falls evaluation completed Falls evaluation completed    MEDICARE RISK AT HOME:  Medicare Risk at Home Any stairs in or around the home?: No If so, are there any without handrails?: No Home free of loose throw rugs in walkways, pet beds, electrical cords, etc?: Yes Adequate lighting in your home to reduce risk of falls?: Yes Life alert?: No Use of a cane, walker or w/c?: No Grab bars in the bathroom?: Yes Shower chair or bench in shower?: Yes Elevated toilet seat or a handicapped toilet?: Yes  TIMED UP AND GO:  Was the test performed?  No  Cognitive Function: 6CIT completed  03/16/2024    2:34 PM 04/05/2023    2:28 PM  6CIT Screen  What Year? 0 points 0 points  What month? 0 points 0 points  What time? 0 points 0 points  Count back from 20 0 points 0 points  Months in reverse 0 points 0 points  Repeat phrase 0 points  0 points  Total Score 0 points 0 points    Immunizations Immunization History  Administered Date(s) Administered   Fluad Quad(high Dose 65+) 05/11/2022   Fluad Trivalent(High Dose 65+) 06/21/2023   Influenza Split 05/29/2011, 05/17/2012   Influenza Whole 07/07/2004, 05/24/2010   Influenza, High Dose Seasonal PF 06/03/2015, 05/19/2016, 05/19/2017, 05/19/2018, 05/10/2019   Influenza-Unspecified 06/16/2013, 04/17/2014   Moderna Sars-Covid-2 Vaccination 09/29/2019, 10/27/2019, 06/26/2020   Pneumococcal Conjugate-13 07/07/2013, 12/10/2020   Pneumococcal Polysaccharide-23 06/07/2009, 01/14/2016, 11/30/2016   Td 06/17/2012   Tdap 11/04/2015   Zoster Recombinant(Shingrix) 02/08/2018, 05/19/2018   Zoster, Live 08/19/2010    Screening Tests Health Maintenance  Topic Date Due   COVID-19 Vaccine (4 - 2024-25 season) 04/18/2023   INFLUENZA VACCINE  03/17/2024   Medicare Annual Wellness (AWV)  03/16/2025   DTaP/Tdap/Td (3 - Td or Tdap) 11/03/2025   Pneumococcal Vaccine: 50+ Years  Completed   Zoster Vaccines- Shingrix  Completed   Hepatitis B Vaccines  Aged Out   HPV VACCINES  Aged Out   Meningococcal B Vaccine  Aged Out    Health Maintenance  Health Maintenance Due  Topic Date Due   COVID-19 Vaccine (4 - 2024-25 season) 04/18/2023   Health Maintenance Items Addressed: 03/16/2024  Additional Screening:  Vision Screening: Recommended annual ophthalmology exams for early detection of glaucoma and other disorders of the eye. Would you like a referral to an eye doctor? No    Dental Screening: Recommended annual dental exams for proper oral hygiene  Community Resource Referral / Chronic Care Management: CRR required this visit?  No   CCM required this visit?  No   Plan:    I have personally reviewed and noted the following in the patient's chart:   Medical and social history Use of alcohol , tobacco or illicit drugs  Current medications and supplements including opioid  prescriptions. Patient is not currently taking opioid prescriptions. Functional ability and status Nutritional status Physical activity Advanced directives List of other physicians Hospitalizations, surgeries, and ER visits in previous 12 months Vitals Screenings to include cognitive, depression, and falls Referrals and appointments  In addition, I have reviewed and discussed with patient certain preventive protocols, quality metrics, and best practice recommendations. A written personalized care plan for preventive services as well as general preventive health recommendations were provided to patient.   Verdie CHRISTELLA Saba, CMA   03/16/2024   After Visit Summary: (MyChart) Due to this being a telephonic visit, the after visit summary with patients personalized plan was offered to patient via MyChart   Notes: Nothing significant to report at this time.  Medical screening examination/treatment/procedure(s) were performed by non-physician practitioner and as supervising physician I was immediately available for consultation/collaboration.  I agree with above. Karlynn Noel, MD

## 2024-03-21 ENCOUNTER — Telehealth: Payer: Self-pay

## 2024-03-21 ENCOUNTER — Encounter: Payer: Self-pay | Admitting: Internal Medicine

## 2024-03-21 ENCOUNTER — Ambulatory Visit (INDEPENDENT_AMBULATORY_CARE_PROVIDER_SITE_OTHER): Admitting: Internal Medicine

## 2024-03-21 VITALS — BP 165/77 | HR 80 | Temp 97.6°F | Ht 70.0 in | Wt 191.0 lb

## 2024-03-21 DIAGNOSIS — M5441 Lumbago with sciatica, right side: Secondary | ICD-10-CM

## 2024-03-21 DIAGNOSIS — N401 Enlarged prostate with lower urinary tract symptoms: Secondary | ICD-10-CM | POA: Diagnosis not present

## 2024-03-21 DIAGNOSIS — R0609 Other forms of dyspnea: Secondary | ICD-10-CM | POA: Diagnosis not present

## 2024-03-21 DIAGNOSIS — J479 Bronchiectasis, uncomplicated: Secondary | ICD-10-CM | POA: Diagnosis not present

## 2024-03-21 DIAGNOSIS — G8929 Other chronic pain: Secondary | ICD-10-CM | POA: Diagnosis not present

## 2024-03-21 DIAGNOSIS — I2510 Atherosclerotic heart disease of native coronary artery without angina pectoris: Secondary | ICD-10-CM | POA: Diagnosis not present

## 2024-03-21 DIAGNOSIS — M5442 Lumbago with sciatica, left side: Secondary | ICD-10-CM | POA: Diagnosis not present

## 2024-03-21 MED ORDER — TADALAFIL 5 MG PO TABS
5.0000 mg | ORAL_TABLET | Freq: Every day | ORAL | 11 refills | Status: AC
Start: 2024-03-21 — End: ?

## 2024-03-21 NOTE — Assessment & Plan Note (Signed)
 Worse On Flomax  Added Cialis  5 mg/d

## 2024-03-21 NOTE — Assessment & Plan Note (Signed)
On Crestor, ASA 

## 2024-03-21 NOTE — Assessment & Plan Note (Signed)
 Using a nebulizer Pt stopped using prednisone 

## 2024-03-21 NOTE — Telephone Encounter (Signed)
 Pharmacy Patient Advocate Encounter  Received notification from [employer] that Prior Authorization for Tadalafil  5MG  tablets has been APPROVED from 03/21/24 to 08/16/24   PA #/Case ID/Reference #:  [id number]

## 2024-03-21 NOTE — Telephone Encounter (Signed)
 Pharmacy Patient Advocate Encounter   Received notification from CoverMyMeds that prior authorization for Tadalafil  5MG  tablets is required/requested.   Insurance verification completed.   The patient is insured through [employer] .   Per test claim: PA required; PA submitted to above mentioned insurance via CoverMyMeds Key/confirmation #/EOC [id number] Status is pending

## 2024-03-21 NOTE — Progress Notes (Signed)
 Subjective:  Patient ID: Curtis Jacobs, male    DOB: Sep 10, 1932  Age: 89 y.o. MRN: [id number]  CC: Medical Management of Chronic Issues (2 mnth f/u)   HPI Curtis Jacobs presents for stress w/wife - she is ill (oral cancer) F/u on DOE, HTN, CAD C/o urinary urgency, weak legs at times  Outpatient Medications Prior to Visit  Medication Sig Dispense Refill  . albuterol  (VENTOLIN  HFA) 108 (90 Base) MCG/ACT inhaler Inhale 2 puffs into the lungs every 6 (six) hours as needed for wheezing or shortness of breath. 8 g 5  . amLODipine  (NORVASC ) 2.5 MG tablet Take 1 tablet (2.5 mg total) by mouth daily. 90 tablet 3  . aspirin  EC 81 MG tablet Take 1 tablet (81 mg total) by mouth daily. Swallow whole. 90 tablet 3  . cetirizine (ZYRTEC) 10 MG tablet Take 1 tablet by mouth daily.    . ferrous sulfate  324 MG TBEC Take 324 mg by mouth. Nature Made    . finasteride  (PROSCAR ) 5 MG tablet Take 5 mg by mouth daily.    . fluocinonide cream (LIDEX) 0.05 % Apply 1 application topically daily.    . guaiFENesin  (MUCINEX ) 600 MG 12 hr tablet Take 1 tablet (600 mg total) by mouth 2 (two) times daily. 60 tablet 0  . Multiple Vitamin (MULTIVITAMIN) tablet Take 1 tablet by mouth daily.    . predniSONE  (DELTASONE ) 10 MG tablet Prednisone  10 mg a day. Take pc. 30 tablet 2  . rosuvastatin  (CRESTOR ) 20 MG tablet Take 1 tablet (20 mg total) by mouth daily. 90 tablet 3  . tamsulosin  (FLOMAX ) 0.4 MG CAPS capsule Take 1 capsule (0.4 mg total) by mouth daily. 30 capsule 11   No facility-administered medications prior to visit.    ROS: Review of Systems  Constitutional:  Positive for fatigue. Negative for appetite change and unexpected weight change.  HENT:  Negative for congestion, nosebleeds, sneezing, sore throat and trouble swallowing.   Eyes:  Negative for itching and visual disturbance.  Respiratory:  Positive for shortness of breath. Negative for cough.   Cardiovascular:  Negative for chest pain, palpitations and  leg swelling.  Gastrointestinal:  Negative for abdominal distention, blood in stool, diarrhea and nausea.  Genitourinary:  Negative for frequency and hematuria.  Musculoskeletal:  Negative for back pain, gait problem, joint swelling and neck pain.  Skin:  Negative for rash.  Neurological:  Negative for dizziness, tremors, speech difficulty and weakness.  Psychiatric/Behavioral:  Negative for agitation, dysphoric mood, sleep disturbance and suicidal ideas. The patient is not nervous/anxious.     Objective:  BP (!) 165/77   Pulse 80   Temp 97.6 F (36.4 C) (Oral)   Ht 5' 10 (1.778 m)   Wt 191 lb (86.6 kg)   SpO2 98%   BMI 27.41 kg/m   BP Readings from Last 3 Encounters:  03/21/24 (!) 165/77  02/22/24 130/78  02/11/24 (!) 144/82    Wt Readings from Last 3 Encounters:  03/21/24 191 lb (86.6 kg)  03/16/24 190 lb (86.2 kg)  02/22/24 190 lb 6.4 oz (86.4 kg)    Physical Exam Constitutional:      General: He is not in acute distress.    Appearance: Normal appearance. He is well-developed.     Comments: NAD  Eyes:     Conjunctiva/sclera: Conjunctivae normal.     Pupils: Pupils are equal, round, and reactive to light.  Neck:     Thyroid : No thyromegaly.  Vascular: No JVD.  Cardiovascular:     Rate and Rhythm: Normal rate and regular rhythm.     Heart sounds: Normal heart sounds. No murmur heard.    No friction rub. No gallop.  Pulmonary:     Effort: Pulmonary effort is normal. No respiratory distress.     Breath sounds: Normal breath sounds. No wheezing or rales.  Chest:     Chest wall: No tenderness.  Abdominal:     General: Bowel sounds are normal. There is no distension.     Palpations: Abdomen is soft. There is no mass.     Tenderness: There is no abdominal tenderness. There is no guarding or rebound.  Musculoskeletal:        General: No tenderness. Normal range of motion.     Cervical back: Normal range of motion.  Lymphadenopathy:     Cervical: No cervical  adenopathy.  Skin:    General: Skin is warm and dry.     Findings: No rash.  Neurological:     Mental Status: He is alert and oriented to person, place, and time.     Cranial Nerves: No cranial nerve deficit.     Motor: No abnormal muscle tone.     Coordination: Coordination normal.     Gait: Gait normal.     Deep Tendon Reflexes: Reflexes are normal and symmetric.  Psychiatric:        Behavior: Behavior normal.        Thought Content: Thought content normal.        Judgment: Judgment normal.   LS w/pain  Lab Results  Component Value Date   WBC 3.8 (L) 01/03/2024   HGB 11.2 (L) 01/03/2024   HCT 33.1 (L) 01/03/2024   PLT 85.0 (L) 01/03/2024   GLUCOSE 115 (H) 12/13/2023   CHOL 129 11/22/2023   TRIG 137.0 11/22/2023   HDL 36.20 (L) 11/22/2023   LDLDIRECT 101.0 01/10/2015   LDLCALC 66 11/22/2023   ALT 12 12/13/2023   AST 17 12/13/2023   NA 141 12/13/2023   K 4.2 12/13/2023   CL 108 12/13/2023   CREATININE 1.47 12/13/2023   BUN 17 12/13/2023   CO2 26 12/13/2023   TSH 2.95 12/13/2023   PSA 17.10 (H) 11/22/2023   HGBA1C 5.8 02/02/2019    CT Angio Chest Pulmonary Embolism (PE) W or WO Contrast Result Date: 01/04/2024 CLINICAL DATA:  Pulmonary embolism (PE) suspected, high prob Dyspnea on exertion, elevated D-dimer. Dyspnea on exertion. Generalized weakness. Shortness of breath. EXAM: CT ANGIOGRAPHY CHEST WITH CONTRAST TECHNIQUE: Multidetector CT imaging of the chest was performed using the standard protocol during bolus administration of intravenous contrast. Multiplanar CT image reconstructions and MIPs were obtained to evaluate the vascular anatomy. RADIATION DOSE REDUCTION: This exam was performed according to the departmental dose-optimization program which includes automated exposure control, adjustment of the mA and/or kV according to patient size and/or use of iterative reconstruction technique. CONTRAST:  75mL ISOVUE -370 IOPAMIDOL  (ISOVUE -370) INJECTION 76% COMPARISON:  CT  scan chest from 08/08/2021. FINDINGS: Cardiovascular: No evidence of embolism to the proximal subsegmental pulmonary artery level. Normal cardiac size. No pericardial effusion. Redemonstration of dilation of ascending thoracic aorta measuring up to 4.1 x 4.2 cm on this non cardiac gated exam. Follow-up examination is recommended with CTA chest with cardiac gating for accurate measurement and device follow-up strategy. There are coronary artery calcifications, in keeping with coronary artery disease. There are also moderate peripheral atherosclerotic vascular calcifications of thoracic aorta and its major branches.  Mediastinum/Nodes: Visualized thyroid  gland appears grossly unremarkable. No solid / cystic mediastinal masses. The esophagus is nondistended precluding optimal assessment. There are few mildly prominent mediastinal and hilar lymph nodes, which do not meet the size criteria for lymphadenopathy and appear grossly similar to the prior study, favoring benign etiology. No axillary lymphadenopathy by size criteria. Lungs/Pleura: The central tracheo-bronchial tree is patent. Redemonstration of postsurgical changes from prior right upper lobe wedge resection. There are stable areas of fibrosis/scarring in the right lung. Redemonstration of biapical pleuroparenchymal disease, right more than left. Redemonstration of patchy areas of bronchiectasis throughout bilateral lungs, essentially unchanged. There also associated scattered nonspecific pulmonary nodules throughout bilateral lungs. 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 No new mass or consolidation. No pleural effusion or pneumothorax. No new suspicious lung nodule seen. Stable calcified granuloma in the left lung upper lobe as well as adjacent additional irregular soft tissue nodule (series 7, images 56 and 49). Upper Abdomen: Small volume calcified gallstones noted without imaging signs of acute cholecystitis.  There is small sliding hiatal hernia. Remaining visualized upper abdominal viscera within normal limits. Musculoskeletal: The visualized soft tissues of the chest wall are grossly unremarkable. No suspicious osseous lesions. There are mild multilevel degenerative changes in the visualized spine. Redemonstration of postsurgical changes in the right posteromedial sixth rib. Review of the MIP images confirms the above findings. IMPRESSION: 1. No embolism to the proximal subsegmental pulmonary artery level. 2. Redemonstration of dilation of ascending thoracic aorta measuring up to 4.1 x 4.2 cm on this non cardiac gated exam. Follow-up examination is recommended with CTA chest with cardiac gating for accurate measurement and to device a follow-up strategy. 3. Redemonstration of postsurgical changes from prior right upper lobe wedge resection. There are stable areas of fibrosis/scarring in the right lung. Redemonstration of patchy areas of bronchiectasis throughout bilateral lungs, essentially unchanged. There are also associated scattered nonspecific pulmonary nodules throughout bilateral lungs. 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 No new mass or consolidation. No pleural effusion or pneumothorax. No new suspicious lung nodule seen. 4. Multiple other nonacute observations, as described above. Aortic Atherosclerosis (ICD10-I70.0). Electronically Signed   By: Ree Molt M.D.   On: 01/04/2024 11:53    Assessment & Plan:   Problem List Items Addressed This Visit     BPH (benign prostatic hyperplasia)   Worse On Flomax  Added Cialis  5 mg/d      Bronchiectasis without acute exacerbation (HCC)   Using a nebulizer Pt stopped using prednisone        Coronary atherosclerosis   On Crestor , ASA      Relevant Medications   tadalafil  (CIALIS ) 5 MG tablet   DOE (dyspnea on exertion) - Primary   SOB, fatigue - better on Prednisone , low dose  Potential  benefits of a long term steroid  use as well as potential risks  and complications were explained to the patient and were aknowledged.      Low back pain   DMV form filled out         Meds ordered this encounter  Medications  . tadalafil  (CIALIS ) 5 MG tablet    Sig: Take 1 tablet (5 mg total) by mouth daily.    Dispense:  30 tablet    Refill:  11      Follow-up: Return in about 3 months (around 06/21/2024) for a follow-up visit.  Marolyn Noel, MD

## 2024-03-21 NOTE — Assessment & Plan Note (Signed)
 DMV form filled out.

## 2024-03-21 NOTE — Assessment & Plan Note (Signed)
 SOB, fatigue - better on Prednisone , low dose  Potential benefits of a long term steroid  use as well as potential risks  and complications were explained to the patient and were aknowledged.

## 2024-03-27 DIAGNOSIS — M533 Sacrococcygeal disorders, not elsewhere classified: Secondary | ICD-10-CM | POA: Diagnosis not present

## 2024-04-03 ENCOUNTER — Ambulatory Visit: Payer: Self-pay | Admitting: Internal Medicine

## 2024-04-03 ENCOUNTER — Ambulatory Visit
Admission: RE | Admit: 2024-04-03 | Discharge: 2024-04-03 | Disposition: A | Discharge status: Home / Self Care | Source: Ambulatory Visit | Attending: Internal Medicine | Admitting: Internal Medicine

## 2024-04-03 ENCOUNTER — Ambulatory Visit: Admission: RE | Admit: 2024-04-03 | Source: Ambulatory Visit

## 2024-04-03 DIAGNOSIS — N644 Mastodynia: Secondary | ICD-10-CM | POA: Diagnosis not present

## 2024-04-03 DIAGNOSIS — N62 Hypertrophy of breast: Secondary | ICD-10-CM

## 2024-04-04 ENCOUNTER — Ambulatory Visit: Admitting: Internal Medicine

## 2024-04-04 DIAGNOSIS — M533 Sacrococcygeal disorders, not elsewhere classified: Secondary | ICD-10-CM | POA: Diagnosis not present

## 2024-04-06 ENCOUNTER — Ambulatory Visit: Payer: Medicare Other | Admitting: Internal Medicine

## 2024-05-08 NOTE — Progress Notes (Deleted)
 @Patient  ID: Curtis Jacobs, male    DOB: 1933-03-04, 89 y.o.   MRN: [id number]  No chief complaint on file.   Referring provider: Plotnikov, Karlynn GAILS, MD  HPI: 89 year old gentleman, past medical history of BPH, hypertension, hyperlipidemia.  Patient has been been followed for many years in the past by Dr. Alaine.  Last CT scan of the chest was in 2016 to follow a left upper lobe pulmonary nodule.  He had 1 nodule resected in the right upper lobe many years ago that was benign.  From a respiratory standpoint he has been doing well he uses his nebulizer rarely.  He tries to get up every single day with a airway clearance routine.  Lots of coughing and clearing of his chest.  He starts with a dry cough in the morning usually becomes more productive for about 15 to 20 minutes of airway clearance.  Then he is fine for the rest of the day.  He does take guaifenesin  daily.  He has been on this also for several years.  Of note he states he had pneumonia that almost killed him many years ago was in the ICU.  Felt to have bronchiectasis and scarring related to his previous illness.  Occupation: Patient is a retired Curator, Insurance account manager.   Previous [facility] pulmonary encounter:  OV 09/05/2021: Here today for follow-up.Followed in pulmonary clinic with associated bilateral pulmonary nodules and bronchiectasis. Last year to rounds of abx. Nothing recently. Unable to get hypertonic saline from pharmacy. Not using his nebs regularly.  Patient has regular episodes in the morning that requires persistent coughing to get his congestion cleared.   This is a 89 year old gentleman for a long time history of following in clinic secondary to bilateral pulmonary nodules and associated bronchiectasis.  In the past has been admitted to the hospital and even the intensive care unit for pneumonia.  He has a daily regimen and of airway clearance techniques and cough and sputum production.  Plan: Continue guaifenesin  Continue  DuoNebs. Patient has not been really using any of the hypertonic saline because he been unable to get that at local pharmacy. Has not really been using his DuoNebs either.  So I encouraged him to swap that out to at least use DuoNeb once in the morning once in the evening help with airway clearance. Continue flutter valve use daily. Return to clinic in 1 year or as needed.  09/07/2022 Patient presents today for annual follow-up/ bronchiectasis and pulmonary nodules. He has chronic np cough. Cough is congested but not productive. No purulence to sputum. Congestion is worse in the morning. He does a lot of throat clearing. He does not use nebulizer or flutter valve regularly. He uses sodium chloride  nebulizer when needed. He takes 1200mg  mucinex  twice daily. Needs CTA to follow-up on bronchiectasis and Ectasia the ascending thoracic aorta. Denies shortness of breath, abdominal or back pain    05/09/2024- Interim hx  Discussed the use of AI scribe software for clinical note transcription with the patient, who gave verbal consent to proceed.  History of Present Illness   Former [facility] patient  Bronchiectasis, bilateral pulmonary nodules Guifenesin, duonebs , flutter alve        Allergies  Allergen Reactions   Cordran  [place] ] Rash    rash   Lisinopril Cough    REACTION: cough    Immunization History  Administered Date(s) Administered   Fluad Quad(high Dose 65+) 05/11/2022   Fluad Trivalent(High Dose 65+) 06/21/2023   INFLUENZA, HIGH DOSE  SEASONAL PF 06/03/2015, 05/19/2016, 05/19/2017, 05/19/2018, 05/10/2019   Influenza Split 05/29/2011, 05/17/2012   Influenza Whole 07/07/2004, 05/24/2010   Influenza-Unspecified 06/16/2013, 04/17/2014   Moderna Sars-Covid-2 Vaccination 09/29/2019, 10/27/2019, 06/26/2020   Pneumococcal Conjugate-13 07/07/2013, 12/10/2020   Pneumococcal Polysaccharide-23 06/07/2009, 01/14/2016, 11/30/2016   Td 06/17/2012   Tdap 11/04/2015   Zoster  Recombinant(Shingrix) 02/08/2018, 05/19/2018   Zoster, Live 08/19/2010    Past Medical History:  Diagnosis Date   BPH (benign prostatic hypertrophy)    Dr. Alline   CAD (coronary artery disease)    HA (headache)    Traction- Dr. Jenel- had MRI.MRA   HTN (hypertension)    Hyperlipidemia     Tobacco History: Social History   Tobacco Use  Smoking Status Former   Current packs/day: 0.00   Average packs/day: 0.1 packs/day for 3.0 years (0.3 ttl pk-yrs)   Types: Cigarettes   Start date: 07/25/1949   Quit date: 07/25/1952   Years since quitting: 71.8  Smokeless Tobacco Never  Tobacco Comments   used to smoke but not inhale   Counseling given: Not Answered Tobacco comments: used to smoke but not inhale   Outpatient Medications Prior to Visit  Medication Sig Dispense Refill   albuterol  (VENTOLIN  HFA) 108 (90 Base) MCG/ACT inhaler Inhale 2 puffs into the lungs every 6 (six) hours as needed for wheezing or shortness of breath. 8 g 5   amLODipine  (NORVASC ) 2.5 MG tablet Take 1 tablet (2.5 mg total) by mouth daily. 90 tablet 3   aspirin  EC 81 MG tablet Take 1 tablet (81 mg total) by mouth daily. Swallow whole. 90 tablet 3   cetirizine (ZYRTEC) 10 MG tablet Take 1 tablet by mouth daily.     ferrous sulfate  324 MG TBEC Take 324 mg by mouth. Nature Made     finasteride  (PROSCAR ) 5 MG tablet Take 5 mg by mouth daily.     fluocinonide cream (LIDEX) 0.05 % Apply 1 application topically daily.     guaiFENesin  (MUCINEX ) 600 MG 12 hr tablet Take 1 tablet (600 mg total) by mouth 2 (two) times daily. 60 tablet 0   Multiple Vitamin (MULTIVITAMIN) tablet Take 1 tablet by mouth daily.     predniSONE  (DELTASONE ) 10 MG tablet Prednisone  10 mg a day. Take pc. 30 tablet 2   rosuvastatin  (CRESTOR ) 20 MG tablet Take 1 tablet (20 mg total) by mouth daily. 90 tablet 3   tadalafil  (CIALIS ) 5 MG tablet Take 1 tablet (5 mg total) by mouth daily. 30 tablet 11   tamsulosin  (FLOMAX ) 0.4 MG CAPS capsule  Take 1 capsule (0.4 mg total) by mouth daily. 30 capsule 11   No facility-administered medications prior to visit.      Review of Systems  Review of Systems   Physical Exam  There were no vitals taken for this visit. Physical Exam  ***  Lab Results:  CBC    Component Value Date/Time   WBC 3.8 (L) 01/03/2024 1405   RBC 3.57 (L) 01/03/2024 1405   HGB 11.2 (L) 01/03/2024 1405   HCT 33.1 (L) 01/03/2024 1405   PLT 85.0 (L) 01/03/2024 1405   MCV 92.7 01/03/2024 1405   MCH 31.5 11/05/2023 0729   MCHC 33.8 01/03/2024 1405   RDW 15.4 01/03/2024 1405   LYMPHSABS 1.3 01/03/2024 1405   MONOABS 0.4 01/03/2024 1405   EOSABS 0.1 01/03/2024 1405   BASOSABS 0.0 01/03/2024 1405    BMET    Component Value Date/Time   NA 141 12/13/2023 0944  K 4.2 12/13/2023 0944   CL 108 12/13/2023 0944   CO2 26 12/13/2023 0944   GLUCOSE 115 (H) 12/13/2023 0944   BUN 17 12/13/2023 0944   CREATININE 1.47 12/13/2023 0944   CALCIUM  9.6 12/13/2023 0944   GFRNONAA 41 (L) 11/05/2023 0729   GFRAA 85 03/19/2008 0814    BNP    Component Value Date/Time   BNP 287.6 (H) 11/03/2023 0427    ProBNP    Component Value Date/Time   PROBNP 94.0 12/13/2023 0944    Imaging: No results found.   Assessment & Plan:   No problem-specific Assessment & Plan notes found for this encounter.   1. Multiple pulmonary nodules (Primary)   Assessment and Plan Assessment & Plan        Almarie LELON Ferrari, NP 05/08/2024

## 2024-05-09 ENCOUNTER — Encounter: Payer: Self-pay | Admitting: Primary Care

## 2024-05-09 ENCOUNTER — Ambulatory Visit: Admitting: Primary Care

## 2024-05-09 DIAGNOSIS — R918 Other nonspecific abnormal finding of lung field: Secondary | ICD-10-CM

## 2024-05-12 DIAGNOSIS — M160 Bilateral primary osteoarthritis of hip: Secondary | ICD-10-CM | POA: Diagnosis not present

## 2024-05-12 DIAGNOSIS — M48062 Spinal stenosis, lumbar region with neurogenic claudication: Secondary | ICD-10-CM | POA: Diagnosis not present

## 2024-05-15 ENCOUNTER — Encounter: Payer: Self-pay | Admitting: Primary Care

## 2024-05-15 ENCOUNTER — Ambulatory Visit: Admitting: Primary Care

## 2024-05-15 VITALS — BP 142/84 | HR 68 | Temp 97.5°F | Ht 70.0 in | Wt 190.6 lb

## 2024-05-15 DIAGNOSIS — J479 Bronchiectasis, uncomplicated: Secondary | ICD-10-CM

## 2024-05-15 DIAGNOSIS — Z23 Encounter for immunization: Secondary | ICD-10-CM

## 2024-05-15 DIAGNOSIS — R918 Other nonspecific abnormal finding of lung field: Secondary | ICD-10-CM

## 2024-05-15 DIAGNOSIS — I77810 Thoracic aortic ectasia: Secondary | ICD-10-CM | POA: Diagnosis not present

## 2024-05-15 NOTE — Patient Instructions (Addendum)
  Continue to use albuterol  2 puffs every 4-6 hours as needed You can stop taking mucinex , if cough worsen please resume CT showed stable scarring and lung nodules Follow-up with Dr. Garald regarding thoracic aortic aneurysm  Get RSV vaccine in 2 weeks through your pharmacy (call ahead) Notify office if you develop colored mucus, increase sputum production or chest pain  Orders: High dose flu and pneumonia vaccine today   Follow-up 1 year with [facility] NP or sooner if needed

## 2024-05-15 NOTE — Progress Notes (Signed)
 @Patient  ID: Curtis Jacobs, male    DOB: March 23, 1933, 89 y.o.   MRN: [id number]  Chief Complaint  Patient presents with   Medical Management of Chronic Issues    Lung nodules, bronchiectasis f/u. No complaints.      Referring provider: Plotnikov, Karlynn GAILS, MD  HPI: 89 year old gentleman, past medical history of BPH, hypertension, hyperlipidemia.  Patient has been been followed for many years in the past by Dr. Alaine.  Last CT scan of the chest was in 2016 to follow a left upper lobe pulmonary nodule.  He had 1 nodule resected in the right upper lobe many years ago that was benign.  From a respiratory standpoint he has been doing well he uses his nebulizer rarely.  He tries to get up every single day with a airway clearance routine.  Lots of coughing and clearing of his chest.  He starts with a dry cough in the morning usually becomes more productive for about 15 to 20 minutes of airway clearance.  Then he is fine for the rest of the day.  He does take guaifenesin  daily.  He has been on this also for several years.  Of note he states he had pneumonia that almost killed him many years ago was in the ICU.  Felt to have bronchiectasis and scarring related to his previous illness.  Occupation: Patient is a retired Curator, Insurance account manager.  Previous [facility] pulmonary encounter:  OV 09/05/2021: Here today for follow-up.Followed in pulmonary clinic with associated bilateral pulmonary nodules and bronchiectasis. Last year to rounds of abx. Nothing recently. Unable to get hypertonic saline from pharmacy. Not using his nebs regularly.  Patient has regular episodes in the morning that requires persistent coughing to get his congestion cleared.   This is a 89 year old gentleman for a long time history of following in clinic secondary to bilateral pulmonary nodules and associated bronchiectasis.  In the past has been admitted to the hospital and even the intensive care unit for pneumonia.  He has a daily regimen and  of airway clearance techniques and cough and sputum production.  Plan: Continue guaifenesin  Continue DuoNebs. Patient has not been really using any of the hypertonic saline because he been unable to get that at local pharmacy. Has not really been using his DuoNebs either.  So I encouraged him to swap that out to at least use DuoNeb once in the morning once in the evening help with airway clearance. Continue flutter valve use daily. Return to clinic in 1 year or as needed.  09/07/2022 Patient presents today for annual follow-up/ bronchiectasis and pulmonary nodules. He has chronic np cough. Cough is congested but not productive. No purulence to sputum. Congestion is worse in the morning. He does a lot of throat clearing. He does not use nebulizer or flutter valve regularly. He uses sodium chloride  nebulizer when needed. He takes 1200mg  mucinex  twice daily. Needs CTA to follow-up on bronchiectasis and Ectasia the ascending thoracic aorta. Denies shortness of breath, abdominal or back pain    05/15/2024- Interim hx  Discussed the use of AI scribe software for clinical note transcription with the patient, who gave verbal consent to proceed.  History of Present Illness Curtis Jacobs is an 89 year old male with bronchiectasis who presents for follow-up of his chronic respiratory issues.  He has a history of bronchiectasis, diagnosed after a severe pneumonia episode in 1970 that required ICU admission. He experiences a chronic cough, typically nonproductive unless he uses his inhaler or nebulizer.  He uses an inhaler two to three times daily, especially before bedtime and upon waking, to manage symptoms. He describes a 'gas gauge' sensation in his chest indicating when to use the inhaler to prevent a dry cough and facilitate expectoration of phlegm.  CT angiogram chest in May 2025 showed stable scarring and fibrosis in the right lung, where a wedge resection was performed in 2000. There are nonspecific  pulmonary nodules throughout both lungs and a stable calcified granuloma in the left upper lung lobe.  He has a history of aortic aneurysm, measuring 4.1 by 4.2 cm, which is being monitored. No chest pain or shortness of breath recently.  He also has a history of spinal stenosis, for which he is awaiting an MRI and potential further intervention. Previous injections did not alleviate his symptoms.  He has been using Mucinex  (600 mg twice daily) but reports it has not been effective in managing his cough. No colored mucus, fevers, or sweats recently.  Allergies  Allergen Reactions   Cordran  [Flurandrenolide ] Rash    rash   Lisinopril Cough    REACTION: cough    Immunization History  Administered Date(s) Administered   Fluad Quad(high Dose 65+) 05/11/2022   Fluad Trivalent(High Dose 65+) 06/21/2023   INFLUENZA, HIGH DOSE SEASONAL PF 06/03/2015, 05/19/2016, 05/19/2017, 05/19/2018, 05/10/2019   Influenza Split 05/29/2011, 05/17/2012   Influenza Whole 07/07/2004, 05/24/2010   Influenza-Unspecified 06/16/2013, 04/17/2014   Moderna Sars-Covid-2 Vaccination 09/29/2019, 10/27/2019, 06/26/2020   Pneumococcal Conjugate-13 07/07/2013, 12/10/2020   Pneumococcal Polysaccharide-23 06/07/2009, 01/14/2016, 11/30/2016   Td 06/17/2012   Tdap 11/04/2015   Zoster Recombinant(Shingrix) 02/08/2018, 05/19/2018   Zoster, Live 08/19/2010    Past Medical History:  Diagnosis Date   BPH (benign prostatic hypertrophy)    Dr. Alline   CAD (coronary artery disease)    HA (headache)    Traction- Dr. Jenel- had MRI.MRA   HTN (hypertension)    Hyperlipidemia     Tobacco History: Social History   Tobacco Use  Smoking Status Former   Current packs/day: 0.00   Average packs/day: 0.1 packs/day for 3.0 years (0.3 ttl pk-yrs)   Types: Cigarettes   Start date: 07/25/1949   Quit date: 07/25/1952   Years since quitting: 71.8  Smokeless Tobacco Never  Tobacco Comments   used to smoke but not inhale    Counseling given: Not Answered Tobacco comments: used to smoke but not inhale   Outpatient Medications Prior to Visit  Medication Sig Dispense Refill   albuterol  (VENTOLIN  HFA) 108 (90 Base) MCG/ACT inhaler Inhale 2 puffs into the lungs every 6 (six) hours as needed for wheezing or shortness of breath. 8 g 5   amLODipine  (NORVASC ) 2.5 MG tablet Take 1 tablet (2.5 mg total) by mouth daily. 90 tablet 3   aspirin  EC 81 MG tablet Take 1 tablet (81 mg total) by mouth daily. Swallow whole. 90 tablet 3   cetirizine (ZYRTEC) 10 MG tablet Take 1 tablet by mouth daily.     ferrous sulfate  324 MG TBEC Take 324 mg by mouth. Nature Made     finasteride  (PROSCAR ) 5 MG tablet Take 5 mg by mouth daily.     fluocinonide cream (LIDEX) 0.05 % Apply 1 application topically daily.     guaiFENesin  (MUCINEX ) 600 MG 12 hr tablet Take 1 tablet (600 mg total) by mouth 2 (two) times daily. 60 tablet 0   Multiple Vitamin (MULTIVITAMIN) tablet Take 1 tablet by mouth daily.     predniSONE  (DELTASONE ) 10 MG tablet  Prednisone  10 mg a day. Take pc. 30 tablet 2   rosuvastatin  (CRESTOR ) 20 MG tablet Take 1 tablet (20 mg total) by mouth daily. 90 tablet 3   tadalafil  (CIALIS ) 5 MG tablet Take 1 tablet (5 mg total) by mouth daily. 30 tablet 11   tamsulosin  (FLOMAX ) 0.4 MG CAPS capsule Take 1 capsule (0.4 mg total) by mouth daily. 30 capsule 11   No facility-administered medications prior to visit.   Review of Systems  Review of Systems  Constitutional: Negative.   HENT: Negative.  Negative for congestion.   Respiratory:  Positive for cough. Negative for shortness of breath and wheezing.   Cardiovascular:  Negative for chest pain.   Physical Exam  BP (!) 142/84   Pulse 68   Temp (!) 97.5 F (36.4 C)   Ht 5' 10 (1.778 m)   Wt 190 lb 9.6 oz (86.5 kg)   SpO2 97% Comment: RA  BMI 27.35 kg/m  Physical Exam Constitutional:      Appearance: Normal appearance. He is well-developed.  HENT:     Head:  Normocephalic and atraumatic.     Mouth/Throat:     Mouth: Mucous membranes are moist.     Pharynx: Oropharynx is clear.  Cardiovascular:     Rate and Rhythm: Normal rate and regular rhythm.     Heart sounds: Normal heart sounds.  Pulmonary:     Effort: Pulmonary effort is normal. No respiratory distress.     Breath sounds: Normal breath sounds. No wheezing or rhonchi.  Musculoskeletal:        General: Normal range of motion.     Cervical back: Normal range of motion and neck supple.  Skin:    General: Skin is warm and dry.     Findings: No erythema or rash.  Neurological:     General: No focal deficit present.     Mental Status: He is alert and oriented to person, place, and time. Mental status is at baseline.  Psychiatric:        Mood and Affect: Mood normal.        Behavior: Behavior normal.        Thought Content: Thought content normal.        Judgment: Judgment normal.     Lab Results:  CBC    Component Value Date/Time   WBC 3.8 (L) 01/03/2024 1405   RBC 3.57 (L) 01/03/2024 1405   HGB 11.2 (L) 01/03/2024 1405   HCT 33.1 (L) 01/03/2024 1405   PLT 85.0 (L) 01/03/2024 1405   MCV 92.7 01/03/2024 1405   MCH 31.5 11/05/2023 0729   MCHC 33.8 01/03/2024 1405   RDW 15.4 01/03/2024 1405   LYMPHSABS 1.3 01/03/2024 1405   MONOABS 0.4 01/03/2024 1405   EOSABS 0.1 01/03/2024 1405   BASOSABS 0.0 01/03/2024 1405    BMET    Component Value Date/Time   NA 141 12/13/2023 0944   K 4.2 12/13/2023 0944   CL 108 12/13/2023 0944   CO2 26 12/13/2023 0944   GLUCOSE 115 (H) 12/13/2023 0944   BUN 17 12/13/2023 0944   CREATININE 1.47 12/13/2023 0944   CALCIUM  9.6 12/13/2023 0944   GFRNONAA 41 (L) 11/05/2023 0729   GFRAA 85 03/19/2008 0814    BNP    Component Value Date/Time   BNP 287.6 (H) 11/03/2023 0427    ProBNP    Component Value Date/Time   PROBNP 94.0 12/13/2023 0944    Imaging: No results found.   Assessment &  Plan:   1. Immunization due - Pneumococcal  conjugate vaccine 20-valent (Prevnar-20)  2. Bronchiectasis without complication (HCC) (Primary)  3. Multiple pulmonary nodules  Assessment and Plan Assessment & Plan Bronchiectasis with chronic cough Chronic bronchiectasis secondary to severe pneumonia in 1970, resulting in lung scarring. Patient has a chronic non-productive cough which is well-managed with Albuterol  when needed. No recent respiratory infections or purulent mucus. Recent CT scan in May showed no new concerning findings. - Continue rescue inhaler as needed, typically two to three times daily. - Use hypertonic saline nebulizer during respiratory infections or increased mucus production. - Discontinue Mucinex  unless cough worsens, then consider resuming.  Pulmonary nodules  Pulmonary nodules and calcified granuloma in the left upper lung lobe are nonspecific and unchanged along with stable scarring/fibrosis right lung s/p wedge resection in 2000. Recent CT scan showed no new or suspicious nodules.  Thoracic aortic aneurysm, stable, under surveillance Thoracic aortic aneurysm measuring 4.1 by 4.2 cm, monitoring is necessary to ensure it does not exceed 5 cm, which would warrant surgical consideration. - Order CT scan of the chest in one year to monitor the aortic aneurysm. - Discuss with primary care physician or consider referral to a CT surgeon for further management.  Recording duration: 16 minutes   Almarie LELON Ferrari, NP 05/15/2024

## 2024-05-22 ENCOUNTER — Ambulatory Visit: Admitting: Internal Medicine

## 2024-05-23 ENCOUNTER — Ambulatory Visit: Admitting: Internal Medicine

## 2024-05-23 ENCOUNTER — Ambulatory Visit (INDEPENDENT_AMBULATORY_CARE_PROVIDER_SITE_OTHER)

## 2024-05-23 ENCOUNTER — Encounter: Payer: Self-pay | Admitting: Internal Medicine

## 2024-05-23 VITALS — BP 164/82 | HR 67 | Temp 98.0°F | Ht 70.0 in | Wt 193.2 lb

## 2024-05-23 DIAGNOSIS — N62 Hypertrophy of breast: Secondary | ICD-10-CM

## 2024-05-23 DIAGNOSIS — M25521 Pain in right elbow: Secondary | ICD-10-CM | POA: Diagnosis not present

## 2024-05-23 DIAGNOSIS — M25531 Pain in right wrist: Secondary | ICD-10-CM

## 2024-05-23 MED ORDER — DOXYCYCLINE HYCLATE 100 MG PO TABS: 100.0000 mg | ORAL_TABLET | Freq: Two times a day (BID) | ORAL | 0 refills | Status: AC

## 2024-05-23 MED ORDER — METHYLPREDNISOLONE 4 MG PO TBPK: ORAL_TABLET | ORAL | 0 refills | Status: AC

## 2024-05-23 NOTE — Progress Notes (Signed)
 Subjective:  Patient ID: Curtis Jacobs, male    DOB: 1933-04-19  Age: 89 y.o. MRN: [id number]  CC: Follow-up (Patient states he had a lump come up on his right forearm states he has never had that happen before. )   HPI Curtis Jacobs presents for R prox posterior forearm local swelling and pain x  week. It is smaller today    Outpatient Medications Prior to Visit  Medication Sig Dispense Refill   albuterol  (VENTOLIN  HFA) 108 (90 Base) MCG/ACT inhaler Inhale 2 puffs into the lungs every 6 (six) hours as needed for wheezing or shortness of breath. 8 g 5   amLODipine  (NORVASC ) 2.5 MG tablet Take 1 tablet (2.5 mg total) by mouth daily. 90 tablet 3   aspirin  EC 81 MG tablet Take 1 tablet (81 mg total) by mouth daily. Swallow whole. 90 tablet 3   cetirizine (ZYRTEC) 10 MG tablet Take 1 tablet by mouth daily.     ferrous sulfate  324 MG TBEC Take 324 mg by mouth. Nature Made     finasteride  (PROSCAR ) 5 MG tablet Take 5 mg by mouth daily.     fluocinonide cream (LIDEX) 0.05 % Apply 1 application topically daily.     guaiFENesin  (MUCINEX ) 600 MG 12 hr tablet Take 1 tablet (600 mg total) by mouth 2 (two) times daily. 60 tablet 0   Multiple Vitamin (MULTIVITAMIN) tablet Take 1 tablet by mouth daily.     predniSONE  (DELTASONE ) 10 MG tablet Prednisone  10 mg a day. Take pc. 30 tablet 2   rosuvastatin  (CRESTOR ) 20 MG tablet Take 1 tablet (20 mg total) by mouth daily. 90 tablet 3   tadalafil  (CIALIS ) 5 MG tablet Take 1 tablet (5 mg total) by mouth daily. 30 tablet 11   tamsulosin  (FLOMAX ) 0.4 MG CAPS capsule Take 1 capsule (0.4 mg total) by mouth daily. 30 capsule 11   No facility-administered medications prior to visit.    ROS: Review of Systems  Constitutional:  Negative for appetite change, fatigue and unexpected weight change.  HENT:  Negative for congestion, nosebleeds, sneezing, sore throat and trouble swallowing.   Eyes:  Negative for itching and visual disturbance.  Respiratory:  Negative  for cough.   Cardiovascular:  Negative for chest pain, palpitations and leg swelling.  Gastrointestinal:  Negative for abdominal distention, blood in stool, diarrhea and nausea.  Genitourinary:  Negative for frequency and hematuria.  Musculoskeletal:  Positive for arthralgias and back pain. Negative for gait problem, joint swelling and neck pain.  Skin:  Negative for color change and rash.  Neurological:  Negative for dizziness, tremors, speech difficulty and weakness.  Psychiatric/Behavioral:  Negative for agitation, dysphoric mood and sleep disturbance. The patient is not nervous/anxious.     Objective:  BP (!) 164/82   Pulse 67   Temp 98 F (36.7 C) (Oral)   Ht 5' 10 (1.778 m)   Wt 193 lb 3.2 oz (87.6 kg)   SpO2 95%   BMI 27.72 kg/m   BP Readings from Last 3 Encounters:  05/23/24 (!) 164/82  05/15/24 (!) 142/84  03/21/24 (!) 165/77    Wt Readings from Last 3 Encounters:  05/23/24 193 lb 3.2 oz (87.6 kg)  05/15/24 190 lb 9.6 oz (86.5 kg)  03/21/24 191 lb (86.6 kg)    Physical Exam Constitutional:      General: He is not in acute distress.    Appearance: He is well-developed. He is obese.     Comments: NAD  Eyes:     Conjunctiva/sclera: Conjunctivae normal.     Pupils: Pupils are equal, round, and reactive to light.  Neck:     Thyroid : No thyromegaly.     Vascular: No JVD.  Cardiovascular:     Rate and Rhythm: Normal rate and regular rhythm.     Heart sounds: Normal heart sounds. No murmur heard.    No friction rub. No gallop.  Pulmonary:     Effort: Pulmonary effort is normal. No respiratory distress.     Breath sounds: Normal breath sounds. No wheezing or rales.  Chest:     Chest wall: No tenderness.  Abdominal:     General: Bowel sounds are normal. There is no distension.     Palpations: Abdomen is soft. There is no mass.     Tenderness: There is no abdominal tenderness. There is no guarding or rebound.  Musculoskeletal:        General: No tenderness.  Normal range of motion.     Cervical back: Normal range of motion.  Lymphadenopathy:     Cervical: No cervical adenopathy.  Skin:    General: Skin is warm and dry.     Findings: No rash.  Neurological:     Mental Status: He is alert and oriented to person, place, and time.     Cranial Nerves: No cranial nerve deficit.     Motor: No abnormal muscle tone.     Coordination: Coordination normal.     Gait: Gait normal.     Deep Tendon Reflexes: Reflexes are normal and symmetric.  Psychiatric:        Behavior: Behavior normal.        Thought Content: Thought content normal.        Judgment: Judgment normal.     R prox posterior forearm local swelling  and pain - see pic   Lab Results  Component Value Date   WBC 3.8 (L) 01/03/2024   HGB 11.2 (L) 01/03/2024   HCT 33.1 (L) 01/03/2024   PLT 85.0 (L) 01/03/2024   GLUCOSE 115 (H) 12/13/2023   CHOL 129 11/22/2023   TRIG 137.0 11/22/2023   HDL 36.20 (L) 11/22/2023   LDLDIRECT 101.0 01/10/2015   LDLCALC 66 11/22/2023   ALT 12 12/13/2023   AST 17 12/13/2023   NA 141 12/13/2023   K 4.2 12/13/2023   CL 108 12/13/2023   CREATININE 1.47 12/13/2023   BUN 17 12/13/2023   CO2 26 12/13/2023   TSH 2.95 12/13/2023   PSA 17.10 (H) 11/22/2023   HGBA1C 5.8 02/02/2019    MM 3D DIAGNOSTIC MAMMOGRAM BILATERAL BREAST Result Date: 04/03/2024 CLINICAL DATA:  89 year old male complaining of pain and a palpable abnormality in the subareolar region of the left breast. EXAM: DIGITAL DIAGNOSTIC BILATERAL MAMMOGRAM WITH TOMOSYNTHESIS AND CAD TECHNIQUE: Bilateral digital diagnostic mammography and breast tomosynthesis was performed. The images were evaluated with computer-aided detection. COMPARISON:  None available. ACR Breast Density Category b: There are scattered areas of fibroglandular density. FINDINGS: There is flame shaped fibroglandular tissue in the subareolar regions of both breast that is more prominent on the left. There is no suspicious mass or  malignant type microcalcifications in either breast. IMPRESSION: Bilateral gynecomastia, left greater than right. No evidence of malignancy in either breast. RECOMMENDATION: If the clinical exam remains benign/stable no follow-up is needed. I have discussed the findings and recommendations with the patient. If applicable, a reminder letter will be sent to the patient regarding the next appointment. BI-RADS CATEGORY  2: Benign. Electronically Signed   By: Dina  Arceo M.D.   On: 04/03/2024 11:34    Assessment & Plan:   Problem List Items Addressed This Visit     Forearm joint pain, right - Primary   R prox posterior forearm local swelling and pain - ?bursitis vs other Doxy po x 10 d Celebrex po short course X ray        Relevant Orders   DG Elbow 2 Views Right (Completed)   Gynecomastia, male   Better Mammogram was ok         Meds ordered this encounter  Medications   methylPREDNISolone  (MEDROL  DOSEPAK) 4 MG TBPK tablet    Sig: As directed    Dispense:  21 tablet    Refill:  0   doxycycline  (VIBRA -TABS) 100 MG tablet    Sig: Take 1 tablet (100 mg total) by mouth 2 (two) times daily.    Dispense:  20 tablet    Refill:  0      Follow-up: Return in about 4 weeks (around 06/20/2024) for a follow-up visit.  Marolyn Noel, MD

## 2024-05-23 NOTE — Assessment & Plan Note (Signed)
 Better Mammogram was ok

## 2024-05-23 NOTE — Assessment & Plan Note (Addendum)
 R prox posterior forearm local swelling and pain - ?bursitis vs other Doxy po x 10 d Celebrex po short course X ray

## 2024-05-24 ENCOUNTER — Ambulatory Visit: Payer: Self-pay | Admitting: Internal Medicine

## 2024-05-29 DIAGNOSIS — M545 Low back pain, unspecified: Secondary | ICD-10-CM | POA: Diagnosis not present

## 2024-06-05 DIAGNOSIS — M47816 Spondylosis without myelopathy or radiculopathy, lumbar region: Secondary | ICD-10-CM | POA: Diagnosis not present

## 2024-06-20 ENCOUNTER — Ambulatory Visit: Admitting: Internal Medicine

## 2024-06-20 ENCOUNTER — Encounter: Payer: Self-pay | Admitting: Internal Medicine

## 2024-06-20 VITALS — BP 160/82 | HR 72 | Ht 70.0 in | Wt 192.6 lb

## 2024-06-20 DIAGNOSIS — M5442 Lumbago with sciatica, left side: Secondary | ICD-10-CM

## 2024-06-20 DIAGNOSIS — J479 Bronchiectasis, uncomplicated: Secondary | ICD-10-CM | POA: Diagnosis not present

## 2024-06-20 DIAGNOSIS — L309 Dermatitis, unspecified: Secondary | ICD-10-CM | POA: Diagnosis not present

## 2024-06-20 DIAGNOSIS — M5441 Lumbago with sciatica, right side: Secondary | ICD-10-CM

## 2024-06-20 DIAGNOSIS — I10 Essential (primary) hypertension: Secondary | ICD-10-CM

## 2024-06-20 DIAGNOSIS — G8929 Other chronic pain: Secondary | ICD-10-CM | POA: Diagnosis not present

## 2024-06-20 DIAGNOSIS — R052 Subacute cough: Secondary | ICD-10-CM

## 2024-07-16 ENCOUNTER — Encounter: Payer: Self-pay | Admitting: Internal Medicine

## 2024-07-16 NOTE — Progress Notes (Signed)
 Subjective:  Patient ID: Curtis Jacobs, male    DOB: 09-02-1932  Age: 89 y.o. MRN: [id number]  CC: Medical Management of Chronic Issues (4 Week follow up)   HPI Curtis Jacobs presents for subacute cough, chronic rash, hypertension  Outpatient Medications Prior to Visit  Medication Sig Dispense Refill   albuterol  (VENTOLIN  HFA) 108 (90 Base) MCG/ACT inhaler Inhale 2 puffs into the lungs every 6 (six) hours as needed for wheezing or shortness of breath. 8 g 5   amLODipine  (NORVASC ) 2.5 MG tablet Take 1 tablet (2.5 mg total) by mouth daily. 90 tablet 3   aspirin  EC 81 MG tablet Take 1 tablet (81 mg total) by mouth daily. Swallow whole. 90 tablet 3   cetirizine (ZYRTEC) 10 MG tablet Take 1 tablet by mouth daily.     doxycycline  (VIBRA -TABS) 100 MG tablet Take 1 tablet (100 mg total) by mouth 2 (two) times daily. 20 tablet 0   ferrous sulfate  324 MG TBEC Take 324 mg by mouth. Nature Made     finasteride  (PROSCAR ) 5 MG tablet Take 5 mg by mouth daily.     fluocinonide cream (LIDEX) 0.05 % Apply 1 application topically daily.     guaiFENesin  (MUCINEX ) 600 MG 12 hr tablet Take 1 tablet (600 mg total) by mouth 2 (two) times daily. 60 tablet 0   methylPREDNISolone  (MEDROL  DOSEPAK) 4 MG TBPK tablet As directed 21 tablet 0   Multiple Vitamin (MULTIVITAMIN) tablet Take 1 tablet by mouth daily.     predniSONE  (DELTASONE ) 10 MG tablet Prednisone  10 mg a day. Take pc. 30 tablet 2   rosuvastatin  (CRESTOR ) 20 MG tablet Take 1 tablet (20 mg total) by mouth daily. 90 tablet 3   tadalafil  (CIALIS ) 5 MG tablet Take 1 tablet (5 mg total) by mouth daily. 30 tablet 11   tamsulosin  (FLOMAX ) 0.4 MG CAPS capsule Take 1 capsule (0.4 mg total) by mouth daily. 30 capsule 11   No facility-administered medications prior to visit.    ROS: Review of Systems  Constitutional:  Negative for appetite change, fatigue and unexpected weight change.  HENT:  Negative for congestion, nosebleeds, sneezing, sore throat and  trouble swallowing.   Eyes:  Negative for itching and visual disturbance.  Respiratory:  Positive for cough.   Cardiovascular:  Negative for chest pain, palpitations and leg swelling.  Gastrointestinal:  Negative for abdominal distention, blood in stool, diarrhea and nausea.  Genitourinary:  Negative for frequency and hematuria.  Musculoskeletal:  Positive for back pain. Negative for gait problem, joint swelling and neck pain.  Skin:  Negative for rash.  Neurological:  Negative for dizziness, tremors, speech difficulty and weakness.  Psychiatric/Behavioral:  Negative for agitation, dysphoric mood and sleep disturbance. The patient is not nervous/anxious.     Objective:  BP (!) 160/82   Pulse 72   Ht 5' 10 (1.778 m)   Wt 192 lb 9.6 oz (87.4 kg)   SpO2 97%   BMI 27.64 kg/m   BP Readings from Last 3 Encounters:  06/20/24 (!) 160/82  05/23/24 (!) 164/82  05/15/24 (!) 142/84    Wt Readings from Last 3 Encounters:  06/20/24 192 lb 9.6 oz (87.4 kg)  05/23/24 193 lb 3.2 oz (87.6 kg)  05/15/24 190 lb 9.6 oz (86.5 kg)    Physical Exam Constitutional:      General: He is not in acute distress.    Appearance: Normal appearance. He is well-developed.     Comments: NAD  Eyes:  Conjunctiva/sclera: Conjunctivae normal.     Pupils: Pupils are equal, round, and reactive to light.  Neck:     Thyroid : No thyromegaly.     Vascular: No JVD.  Cardiovascular:     Rate and Rhythm: Normal rate and regular rhythm.     Heart sounds: Normal heart sounds. No murmur heard.    No friction rub. No gallop.  Pulmonary:     Effort: Pulmonary effort is normal. No respiratory distress.     Breath sounds: Normal breath sounds. No wheezing or rales.  Chest:     Chest wall: No tenderness.  Abdominal:     General: Bowel sounds are normal. There is no distension.     Palpations: Abdomen is soft. There is no mass.     Tenderness: There is no abdominal tenderness. There is no guarding or rebound.   Musculoskeletal:        General: No tenderness. Normal range of motion.     Cervical back: Normal range of motion.  Lymphadenopathy:     Cervical: No cervical adenopathy.  Skin:    General: Skin is warm and dry.     Findings: No rash.  Neurological:     Mental Status: He is alert and oriented to person, place, and time.     Cranial Nerves: No cranial nerve deficit.     Motor: No abnormal muscle tone.     Coordination: Coordination normal.     Gait: Gait normal.     Deep Tendon Reflexes: Reflexes are normal and symmetric.  Psychiatric:        Behavior: Behavior normal.        Thought Content: Thought content normal.        Judgment: Judgment normal.   Antalgic gait.  Stiff lumbar spine.  Stooping forward when walking  Lab Results  Component Value Date   WBC 3.8 (L) 01/03/2024   HGB 11.2 (L) 01/03/2024   HCT 33.1 (L) 01/03/2024   PLT 85.0 (L) 01/03/2024   GLUCOSE 115 (H) 12/13/2023   CHOL 129 11/22/2023   TRIG 137.0 11/22/2023   HDL 36.20 (L) 11/22/2023   LDLDIRECT 101.0 01/10/2015   LDLCALC 66 11/22/2023   ALT 12 12/13/2023   AST 17 12/13/2023   NA 141 12/13/2023   K 4.2 12/13/2023   CL 108 12/13/2023   CREATININE 1.47 12/13/2023   BUN 17 12/13/2023   CO2 26 12/13/2023   TSH 2.95 12/13/2023   PSA 17.10 (H) 11/22/2023   HGBA1C 5.8 02/02/2019    MM 3D DIAGNOSTIC MAMMOGRAM BILATERAL BREAST Result Date: 04/03/2024 CLINICAL DATA:  89 year old male complaining of pain and a palpable abnormality in the subareolar region of the left breast. EXAM: DIGITAL DIAGNOSTIC BILATERAL MAMMOGRAM WITH TOMOSYNTHESIS AND CAD TECHNIQUE: Bilateral digital diagnostic mammography and breast tomosynthesis was performed. The images were evaluated with computer-aided detection. COMPARISON:  None available. ACR Breast Density Category b: There are scattered areas of fibroglandular density. FINDINGS: There is flame shaped fibroglandular tissue in the subareolar regions of both breast that is more  prominent on the left. There is no suspicious mass or malignant type microcalcifications in either breast. IMPRESSION: Bilateral gynecomastia, left greater than right. No evidence of malignancy in either breast. RECOMMENDATION: If the clinical exam remains benign/stable no follow-up is needed. I have discussed the findings and recommendations with the patient. If applicable, a reminder letter will be sent to the patient regarding the next appointment. BI-RADS CATEGORY  2: Benign. Electronically Signed   By: Harrie  Arceo M.D.   On: 04/03/2024 11:34    Assessment & Plan:   Problem List Items Addressed This Visit     Bronchiectasis without acute exacerbation (HCC) - Primary   Better.  Using a nebulizer Not using prednisone        Cough   Better Use HHN as needed      Eczema   Rash - better on Calamine      Essential hypertension   Not better Continue on amlodipine  Check blood pressure at home call if elevated      Low back pain   Chronic symptoms.  Repeat injections if problems         No orders of the defined types were placed in this encounter.     Follow-up: No follow-ups on file.  Marolyn Noel, MD

## 2024-07-16 NOTE — Assessment & Plan Note (Signed)
 Better Use HHN as needed

## 2024-07-16 NOTE — Assessment & Plan Note (Signed)
 Rash - better on Calamine

## 2024-07-16 NOTE — Assessment & Plan Note (Signed)
 Chronic symptoms.  Repeat injections if problems

## 2024-07-16 NOTE — Assessment & Plan Note (Signed)
 Not better Continue on amlodipine  Check blood pressure at home call if elevated

## 2024-07-16 NOTE — Assessment & Plan Note (Signed)
 Better.  Using a nebulizer Not using prednisone 

## 2024-08-18 ENCOUNTER — Telehealth: Payer: Self-pay

## 2024-08-18 ENCOUNTER — Other Ambulatory Visit (HOSPITAL_COMMUNITY): Payer: Self-pay

## 2024-08-18 NOTE — Telephone Encounter (Signed)
 Pharmacy Patient Advocate Encounter  Received notification from [employer] that Prior Authorization for Tadalafil  5 tabs has been APPROVED from 08/18/24 to 08/16/25.     PA #/Case ID/Reference #: # [id number]

## 2024-08-18 NOTE — Telephone Encounter (Signed)
 Pharmacy Patient Advocate Encounter   Received notification from [employer]base that prior authorization for Tadalafil  5 is required/requested.   Insurance verification completed.   The patient is insured through [facility].   Per test claim: PA required; PA submitted to above mentioned insurance via Latent Key/confirmation #/EOC [id number] Status is pending

## 2024-09-21 ENCOUNTER — Ambulatory Visit: Admitting: Internal Medicine

## 2024-10-09 ENCOUNTER — Ambulatory Visit: Admitting: Internal Medicine

## 2025-03-27 ENCOUNTER — Ambulatory Visit
